# Patient Record
Sex: Female | Born: 1945 | Race: White | Hispanic: No | Smoking: Never smoker
Health system: Southern US, Community
[De-identification: ages and names within clinical notes are randomized; demographics above are authoritative.]

## PROBLEM LIST (undated history)

## (undated) DIAGNOSIS — E785 Hyperlipidemia, unspecified: Secondary | ICD-10-CM

## (undated) DIAGNOSIS — I350 Nonrheumatic aortic (valve) stenosis: Secondary | ICD-10-CM

## (undated) DIAGNOSIS — I2510 Atherosclerotic heart disease of native coronary artery without angina pectoris: Secondary | ICD-10-CM

## (undated) DIAGNOSIS — I10 Essential (primary) hypertension: Secondary | ICD-10-CM

## (undated) DIAGNOSIS — Z87442 Personal history of urinary calculi: Secondary | ICD-10-CM

## (undated) DIAGNOSIS — M1990 Unspecified osteoarthritis, unspecified site: Secondary | ICD-10-CM

## (undated) DIAGNOSIS — Z9289 Personal history of other medical treatment: Secondary | ICD-10-CM

## (undated) DIAGNOSIS — R011 Cardiac murmur, unspecified: Secondary | ICD-10-CM

## (undated) DIAGNOSIS — I358 Other nonrheumatic aortic valve disorders: Secondary | ICD-10-CM

## (undated) HISTORY — PX: CARDIAC CATHETERIZATION: SHX172

## (undated) HISTORY — PX: INNER EAR SURGERY: SHX679

## (undated) HISTORY — DX: Hyperlipidemia, unspecified: E78.5

## (undated) HISTORY — PX: EYE SURGERY: SHX253

## (undated) HISTORY — PX: LITHOTRIPSY: SUR834

## (undated) HISTORY — DX: Atherosclerotic heart disease of native coronary artery without angina pectoris: I25.10

## (undated) HISTORY — DX: Personal history of other medical treatment: Z92.89

## (undated) HISTORY — DX: Essential (primary) hypertension: I10

## (undated) HISTORY — PX: APPENDECTOMY: SHX54

## (undated) HISTORY — PX: ABDOMINAL HYSTERECTOMY: SHX81

## (undated) HISTORY — PX: BILATERAL CARPAL TUNNEL RELEASE: SHX6508

## (undated) HISTORY — DX: Cardiac murmur, unspecified: R01.1

## (undated) HISTORY — DX: Other nonrheumatic aortic valve disorders: I35.8

## (undated) HISTORY — PX: CHOLECYSTECTOMY: SHX55

---

## 1998-04-17 ENCOUNTER — Ambulatory Visit (HOSPITAL_COMMUNITY): Admission: RE | Admit: 1998-04-17 | Discharge: 1998-04-17 | Payer: Self-pay | Admitting: *Deleted

## 1998-10-23 ENCOUNTER — Ambulatory Visit (HOSPITAL_COMMUNITY): Admission: RE | Admit: 1998-10-23 | Discharge: 1998-10-23 | Payer: Self-pay | Admitting: Cardiology

## 1999-05-11 ENCOUNTER — Encounter: Payer: Self-pay | Admitting: Obstetrics and Gynecology

## 1999-05-11 ENCOUNTER — Encounter: Admission: RE | Admit: 1999-05-11 | Discharge: 1999-05-11 | Payer: Self-pay | Admitting: Obstetrics and Gynecology

## 1999-05-18 ENCOUNTER — Other Ambulatory Visit: Admission: RE | Admit: 1999-05-18 | Discharge: 1999-05-18 | Payer: Self-pay | Admitting: Obstetrics and Gynecology

## 2000-05-29 ENCOUNTER — Encounter: Payer: Self-pay | Admitting: Obstetrics and Gynecology

## 2000-05-29 ENCOUNTER — Encounter: Admission: RE | Admit: 2000-05-29 | Discharge: 2000-05-29 | Payer: Self-pay | Admitting: Obstetrics and Gynecology

## 2000-05-29 ENCOUNTER — Other Ambulatory Visit: Admission: RE | Admit: 2000-05-29 | Discharge: 2000-05-29 | Payer: Self-pay | Admitting: Obstetrics and Gynecology

## 2001-02-14 ENCOUNTER — Ambulatory Visit (HOSPITAL_COMMUNITY): Admission: RE | Admit: 2001-02-14 | Discharge: 2001-02-14 | Payer: Self-pay | Admitting: Gastroenterology

## 2001-02-14 ENCOUNTER — Encounter (INDEPENDENT_AMBULATORY_CARE_PROVIDER_SITE_OTHER): Payer: Self-pay | Admitting: Specialist

## 2001-05-30 ENCOUNTER — Encounter: Payer: Self-pay | Admitting: Gynecology

## 2001-05-30 ENCOUNTER — Other Ambulatory Visit: Admission: RE | Admit: 2001-05-30 | Discharge: 2001-05-30 | Payer: Self-pay | Admitting: Gynecology

## 2001-05-30 ENCOUNTER — Encounter: Admission: RE | Admit: 2001-05-30 | Discharge: 2001-05-30 | Payer: Self-pay | Admitting: Gynecology

## 2001-07-06 ENCOUNTER — Encounter: Admission: RE | Admit: 2001-07-06 | Discharge: 2001-07-06 | Payer: Self-pay | Admitting: Gynecology

## 2001-07-06 ENCOUNTER — Encounter: Payer: Self-pay | Admitting: Gynecology

## 2002-06-03 ENCOUNTER — Other Ambulatory Visit: Admission: RE | Admit: 2002-06-03 | Discharge: 2002-06-03 | Payer: Self-pay | Admitting: Gynecology

## 2002-06-04 ENCOUNTER — Encounter: Payer: Self-pay | Admitting: Gynecology

## 2002-06-04 ENCOUNTER — Encounter: Admission: RE | Admit: 2002-06-04 | Discharge: 2002-06-04 | Payer: Self-pay | Admitting: Gynecology

## 2003-06-10 ENCOUNTER — Other Ambulatory Visit: Admission: RE | Admit: 2003-06-10 | Discharge: 2003-06-10 | Payer: Self-pay | Admitting: Gynecology

## 2004-06-14 ENCOUNTER — Other Ambulatory Visit: Admission: RE | Admit: 2004-06-14 | Discharge: 2004-06-14 | Payer: Self-pay | Admitting: Gynecology

## 2005-07-14 ENCOUNTER — Other Ambulatory Visit: Admission: RE | Admit: 2005-07-14 | Discharge: 2005-07-14 | Payer: Self-pay | Admitting: Gynecology

## 2006-07-17 ENCOUNTER — Other Ambulatory Visit: Admission: RE | Admit: 2006-07-17 | Discharge: 2006-07-17 | Payer: Self-pay | Admitting: Gynecology

## 2009-08-17 ENCOUNTER — Encounter: Admission: RE | Admit: 2009-08-17 | Discharge: 2009-08-17 | Payer: Self-pay | Admitting: Gynecology

## 2010-07-12 DIAGNOSIS — Z9289 Personal history of other medical treatment: Secondary | ICD-10-CM

## 2010-07-12 HISTORY — DX: Personal history of other medical treatment: Z92.89

## 2010-07-12 HISTORY — PX: TRANSTHORACIC ECHOCARDIOGRAM: SHX275

## 2010-07-21 ENCOUNTER — Other Ambulatory Visit: Payer: Self-pay | Admitting: Gynecology

## 2010-07-21 DIAGNOSIS — Z1231 Encounter for screening mammogram for malignant neoplasm of breast: Secondary | ICD-10-CM

## 2010-08-17 ENCOUNTER — Other Ambulatory Visit: Payer: Self-pay | Admitting: Gynecology

## 2010-08-19 ENCOUNTER — Ambulatory Visit
Admission: RE | Admit: 2010-08-19 | Discharge: 2010-08-19 | Disposition: A | Discharge status: Home / Self Care | Payer: 59 | Source: Ambulatory Visit | Attending: Gynecology | Admitting: Gynecology

## 2010-08-19 DIAGNOSIS — Z1231 Encounter for screening mammogram for malignant neoplasm of breast: Secondary | ICD-10-CM

## 2010-08-20 NOTE — Procedures (Signed)
[facility]  Patient:    Sierra Carpenter, Sierra Carpenter Visit Number: [id number] MRN: [id number]          Service Type: END Location: ENDO Attending Physician:  Orland Mustard Dictated by:   Llana Aliment. Randa Evens, M.D. Proc. Date: 02/14/01 Admit Date:  02/14/2001   CC:         Lesle Chris, M.D., [facility]   Procedure Report  PROCEDURE PERFORMED:  Colonoscopy and coagulation of polyps.  MEDICATIONS:  Fentanyl 87.5 mcg, Versed 8 mg IV  SCOPE: Olympus pediatric video colonoscope  INDICATIONS:  Strong family history of colon cancer and polyps.  This is done for screening purposes.  DESCRIPTION OF PROCEDURE:  The procedure was explained to the patient and consent obtained.  With the patient in the left lateral decubitus position, the Olympus pediatric video colonoscope was inserted and advanced under direct visualization.  The prep was excellent and we were able to advance to the cecum.  The ileocecal valve was seen.  Appendiceal orifice identified.  The scope was withdrawn. In the mid ascending colon was a 3 mm sessile polyp that was cauterized.  The remainder of the ascending colon, hepatic flexure, transverse colon, splenic flexure, descending and sigmoid colon were seen well. 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 No other polyp seen throughout the colon.  Rectum was free of polyps.  The scope was withdrawn. The patient tolerated the procedure well and was maintained on low flow oxygen and pulse oximetry throughout the procedure with no obvious problems.  ASSESSMENT:  Ascending colon polyp removed.  PLAN:  Will give routine post polypectomy instructions and will recommend repeating this procedure depending on the results of the pathology in either three years or five years. Dictated by:   Llana Aliment. Randa Evens, M.D. Attending Physician:  Orland Mustard DD:  02/14/01 TD:  02/14/01 Job: [id number]

## 2010-10-26 ENCOUNTER — Other Ambulatory Visit: Payer: Self-pay | Admitting: Surgery

## 2011-04-28 ENCOUNTER — Ambulatory Visit (INDEPENDENT_AMBULATORY_CARE_PROVIDER_SITE_OTHER): Payer: 59

## 2011-04-28 DIAGNOSIS — J029 Acute pharyngitis, unspecified: Secondary | ICD-10-CM

## 2011-05-09 ENCOUNTER — Ambulatory Visit (INDEPENDENT_AMBULATORY_CARE_PROVIDER_SITE_OTHER): Payer: 59 | Admitting: Family Medicine

## 2011-05-09 ENCOUNTER — Telehealth: Payer: Self-pay

## 2011-05-09 VITALS — BP 146/82 | HR 72 | Temp 98.0°F | Resp 20 | Ht 61.5 in | Wt 164.0 lb

## 2011-05-09 DIAGNOSIS — E7800 Pure hypercholesterolemia, unspecified: Secondary | ICD-10-CM | POA: Insufficient documentation

## 2011-05-09 DIAGNOSIS — J209 Acute bronchitis, unspecified: Secondary | ICD-10-CM

## 2011-05-09 DIAGNOSIS — Z0000 Encounter for general adult medical examination without abnormal findings: Secondary | ICD-10-CM

## 2011-05-09 DIAGNOSIS — I10 Essential (primary) hypertension: Secondary | ICD-10-CM

## 2011-05-09 DIAGNOSIS — K635 Polyp of colon: Secondary | ICD-10-CM

## 2011-05-09 DIAGNOSIS — E785 Hyperlipidemia, unspecified: Secondary | ICD-10-CM

## 2011-05-09 MED ORDER — HYDROCODONE-HOMATROPINE 5-1.5 MG/5ML PO SYRP: 5.0000 mL | ORAL_SOLUTION | Freq: Four times a day (QID) | ORAL | Status: AC | PRN

## 2011-05-09 MED ORDER — AZITHROMYCIN 250 MG PO TABS: ORAL_TABLET | ORAL | Status: AC

## 2011-05-09 NOTE — Progress Notes (Signed)
This is a 66 year old woman who is retired from [employer]. She is currently married. She presents today with a two-week history of respiratory symptoms beginning with a sore throat. Shortly after the sore throat she came in for a evaluation and was given Magic mouthwash. Subsequently was referred to [place]. She became congested in her nose and now she's had one week of severe cough. Unproductive and there's been no fever she is also is stating that she has no shortness of breath. Patient does not smoke or have other ongoing respiratory problems. She has no chest pain.  Objective: No acute distress, cooperative and appropriate.  I spent 20 minutes reviewing health care maintenance issues the patient and loading them into the computer in a face-to-face interview.  HEENT: Normal  Chest: Few rhonchi otherwise clear  Heart: Normal rhythm rate without murmur.  Assessment: Acute process. Infection with bronchitis, worsening  Plan: Cough medicine and Z-Pak.

## 2011-05-09 NOTE — Telephone Encounter (Signed)
.  [facility] PT STATES THE PHARMACY WOULDN'T REFILL HER COUGH MEDICINE UNLESS IT IS CALLED IN BY THE DR PLEASE CALL PT AT [phone] AND THE PHARMACY IS [facility] ON [address]

## 2011-05-09 NOTE — Telephone Encounter (Signed)
Per 2/4 OV, Hydromet was rx'd.  Pharmacy closed for evening, please call [facility] in am to get details.

## 2011-05-09 NOTE — Patient Instructions (Signed)

## 2011-05-10 NOTE — Telephone Encounter (Signed)
Called hydromet into pharmacy - can not be sent in electronically. Notified pt that Rx called in.

## 2011-07-12 ENCOUNTER — Other Ambulatory Visit: Payer: Self-pay | Admitting: Gynecology

## 2011-07-12 DIAGNOSIS — Z1231 Encounter for screening mammogram for malignant neoplasm of breast: Secondary | ICD-10-CM

## 2011-07-22 ENCOUNTER — Ambulatory Visit (INDEPENDENT_AMBULATORY_CARE_PROVIDER_SITE_OTHER): Payer: 59 | Admitting: Family Medicine

## 2011-07-22 ENCOUNTER — Ambulatory Visit: Payer: 59

## 2011-07-22 VITALS — BP 110/72 | HR 74 | Temp 98.0°F | Resp 18 | Ht 61.5 in | Wt 164.0 lb

## 2011-07-22 DIAGNOSIS — M79609 Pain in unspecified limb: Secondary | ICD-10-CM

## 2011-07-22 DIAGNOSIS — M7730 Calcaneal spur, unspecified foot: Secondary | ICD-10-CM

## 2011-07-22 DIAGNOSIS — M25579 Pain in unspecified ankle and joints of unspecified foot: Secondary | ICD-10-CM

## 2011-07-22 DIAGNOSIS — M109 Gout, unspecified: Secondary | ICD-10-CM

## 2011-07-22 MED ORDER — PREDNISONE 20 MG PO TABS: ORAL_TABLET | ORAL | Status: AC

## 2011-07-22 NOTE — Progress Notes (Signed)
Subjective: Patient is here with pain in her left ankle. Started several days ago. It hurts when she moves her foot down when all. The initial pain awakened her early one morning. Knows of no injury and had not been doing any activities that might have triggered pain that she can think of.  Objective: Left ankle has tenderness along the joint line, a little more anterior to the lateral malleolus but has pain medially also. No warmth swelling or redness was noted.  Assessment: Ankle pain, rule out bony or inflammatory type problems.  Plan: Uricacid, cmet, xray  [facility] reading (PRIMARY) by  Dr. 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 If not better by Monday take prednisone.

## 2011-07-22 NOTE — Patient Instructions (Signed)
Gout Gout is an inflammatory condition (arthritis) caused by a buildup of uric acid crystals in the joints. Uric acid is a chemical that is normally present in the blood. Under some circumstances, uric acid can form into crystals in your joints. This causes joint redness, soreness, and swelling (inflammation). Repeat attacks are common. Over time, uric acid crystals can form into masses (tophi) near a joint, causing disfigurement. Gout is treatable and often preventable. CAUSES  The disease begins with elevated levels of uric acid in the blood. Uric acid is produced by your body when it breaks down a naturally found substance called purines. This also happens when you eat certain foods such as meats and fish. Causes of an elevated uric acid level include:  Being passed down from parent to child (heredity).   Diseases that cause increased uric acid production (obesity, psoriasis, some cancers).   Excessive alcohol use.   Diet, especially diets rich in meat and seafood.   Medicines, including certain cancer-fighting drugs (chemotherapy), diuretics, and aspirin.   Chronic kidney disease. The kidneys are no longer able to remove uric acid well.   Problems with metabolism.  Conditions strongly associated with gout include:  Obesity.   High blood pressure.   High cholesterol.   Diabetes.  Not everyone with elevated uric acid levels gets gout. It is not understood why some people get gout and others do not. Surgery, joint injury, and eating too much of certain foods are some of the factors that can lead to gout. SYMPTOMS   An attack of gout comes on quickly. It causes intense pain with redness, swelling, and warmth in a joint.   Fever can occur.   Often, only one joint is involved. Certain joints are more commonly involved:   Base of the big toe.   Knee.   Ankle.   Wrist.   Finger.  Without treatment, an attack usually goes away in a few days to weeks. Between attacks, you  usually will not have symptoms, which is different from many other forms of arthritis. DIAGNOSIS  Your caregiver will suspect gout based on your symptoms and exam. Removal of fluid from the joint (arthrocentesis) is done to check for uric acid crystals. Your caregiver will give you a medicine that numbs the area (local anesthetic) and use a needle to remove joint fluid for exam. Gout is confirmed when uric acid crystals are seen in joint fluid, using a special microscope. Sometimes, blood, urine, and X-ray tests are also used. TREATMENT  There are 2 phases to gout treatment: treating the sudden onset (acute) attack and preventing attacks (prophylaxis). Treatment of an Acute Attack  Medicines are used. These include anti-inflammatory medicines or steroid medicines.   An injection of steroid medicine into the affected joint is sometimes necessary.   The painful joint is rested. Movement can worsen the arthritis.   You may use warm or cold treatments on painful joints, depending which works best for you.   Discuss the use of coffee, vitamin C, or cherries with your caregiver. These may be helpful treatment options.  Treatment to Prevent Attacks After the acute attack subsides, your caregiver may advise prophylactic medicine. These medicines either help your kidneys eliminate uric acid from your body or decrease your uric acid production. You may need to stay on these medicines for a very long time. The early phase of treatment with prophylactic medicine can be associated with an increase in acute gout attacks. For this reason, during the first few months   of treatment, your caregiver may also advise you to take medicines usually used for acute gout treatment. Be sure you understand your caregiver's directions. You should also discuss dietary treatment with your caregiver. Certain foods such as meats and fish can increase uric acid levels. Other foods such as dairy can decrease levels. Your caregiver  can give you a list of foods to avoid. HOME CARE INSTRUCTIONS   Do not take aspirin to relieve pain. This raises uric acid levels.   Only take over-the-counter or prescription medicines for pain, discomfort, or fever as directed by your caregiver.   Rest the joint as much as possible. When in bed, keep sheets and blankets off painful areas.   Keep the affected joint raised (elevated).   Use crutches if the painful joint is in your leg.   Drink enough water and fluids to keep your urine clear or pale yellow. This helps your body get rid of uric acid. Do not drink alcoholic beverages. They slow the passage of uric acid.   Follow your caregiver's dietary instructions. Pay careful attention to the amount of protein you eat. Your daily diet should emphasize fruits, vegetables, whole grains, and fat-free or low-fat milk products.   Maintain a healthy body weight.  SEEK MEDICAL CARE IF:   You have an oral temperature above 102 F (38.9 C).   You develop diarrhea, vomiting, or any side effects from medicines.   You do not feel better in 24 hours, or you are getting worse.  SEEK IMMEDIATE MEDICAL CARE IF:   Your joint becomes suddenly more tender and you have:   Chills.   An oral temperature above 102 F (38.9 C), not controlled by medicine.  MAKE SURE YOU:   Understand these instructions.   Will watch your condition.   Will get help right away if you are not doing well or get worse.  Document Released: 03/18/2000 Document Revised: 03/10/2011 Document Reviewed: 06/29/2009 Exit[employer] Patient Information 2012 [employer]. 

## 2011-07-23 ENCOUNTER — Encounter: Payer: Self-pay | Admitting: Family Medicine

## 2011-07-23 LAB — COMPLETE METABOLIC PANEL WITH GFR
Alkaline Phosphatase: 70 U/L (ref 39–117)
BUN: 21 mg/dL (ref 6–23)
Creat: 1.13 mg/dL — ABNORMAL HIGH (ref 0.50–1.10)
GFR, Est Non African American: 52 mL/min — ABNORMAL LOW
Glucose, Bld: 81 mg/dL (ref 70–99)
Total Bilirubin: 0.3 mg/dL (ref 0.3–1.2)

## 2011-07-23 LAB — URIC ACID: Uric Acid, Serum: 6.5 mg/dL (ref 2.4–7.0)

## 2011-08-18 ENCOUNTER — Other Ambulatory Visit: Payer: Self-pay | Admitting: Gynecology

## 2011-08-22 ENCOUNTER — Ambulatory Visit
Admission: RE | Admit: 2011-08-22 | Discharge: 2011-08-22 | Disposition: A | Discharge status: Home / Self Care | Payer: 59 | Source: Ambulatory Visit | Attending: Gynecology | Admitting: Gynecology

## 2011-08-22 DIAGNOSIS — Z1231 Encounter for screening mammogram for malignant neoplasm of breast: Secondary | ICD-10-CM

## 2011-11-03 ENCOUNTER — Ambulatory Visit: Payer: 59

## 2011-11-03 ENCOUNTER — Ambulatory Visit (INDEPENDENT_AMBULATORY_CARE_PROVIDER_SITE_OTHER): Payer: 59 | Admitting: Family Medicine

## 2011-11-03 VITALS — BP 134/66 | HR 80 | Temp 97.9°F | Resp 16 | Ht 61.5 in | Wt 145.0 lb

## 2011-11-03 DIAGNOSIS — M79673 Pain in unspecified foot: Secondary | ICD-10-CM

## 2011-11-03 DIAGNOSIS — M79606 Pain in leg, unspecified: Secondary | ICD-10-CM

## 2011-11-03 DIAGNOSIS — M25579 Pain in unspecified ankle and joints of unspecified foot: Secondary | ICD-10-CM

## 2011-11-03 DIAGNOSIS — M25559 Pain in unspecified hip: Secondary | ICD-10-CM

## 2011-11-03 DIAGNOSIS — M79609 Pain in unspecified limb: Secondary | ICD-10-CM

## 2011-11-03 MED ORDER — IBUPROFEN 600 MG PO TABS: 600.0000 mg | ORAL_TABLET | Freq: Three times a day (TID) | ORAL | Status: AC | PRN

## 2011-11-03 MED ORDER — TRAMADOL HCL 50 MG PO TABS: 50.0000 mg | ORAL_TABLET | Freq: Three times a day (TID) | ORAL | Status: AC | PRN
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
## 2011-11-05 ENCOUNTER — Ambulatory Visit (INDEPENDENT_AMBULATORY_CARE_PROVIDER_SITE_OTHER): Payer: 59 | Admitting: Emergency Medicine

## 2011-11-05 VITALS — BP 105/65 | HR 81 | Temp 98.2°F | Resp 16 | Ht 61.5 in | Wt 150.0 lb

## 2011-11-05 DIAGNOSIS — S81819A Laceration without foreign body, unspecified lower leg, initial encounter: Secondary | ICD-10-CM

## 2011-11-05 DIAGNOSIS — S81009A Unspecified open wound, unspecified knee, initial encounter: Secondary | ICD-10-CM

## 2011-11-05 DIAGNOSIS — S91009A Unspecified open wound, unspecified ankle, initial encounter: Secondary | ICD-10-CM
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
## 2012-06-01 ENCOUNTER — Ambulatory Visit (INDEPENDENT_AMBULATORY_CARE_PROVIDER_SITE_OTHER): Payer: 59 | Admitting: Physician Assistant

## 2012-06-01 VITALS — BP 119/78 | HR 109 | Temp 98.2°F | Resp 16 | Ht 62.0 in | Wt 157.0 lb

## 2012-06-01 DIAGNOSIS — J329 Chronic sinusitis, unspecified: Secondary | ICD-10-CM

## 2012-06-01 MED ORDER — AMOXICILLIN-POT CLAVULANATE 875-125 MG PO TABS: 1.0000 | ORAL_TABLET | Freq: Two times a day (BID) | ORAL | Status: DC

## 2012-06-01 MED ORDER — FLUTICASONE PROPIONATE 50 MCG/ACT NA SUSP: 2.0000 | Freq: Every day | NASAL | Status: DC

## 2012-06-01 NOTE — Progress Notes (Signed)
  Subjective:    Patient ID: Sierra Carpenter, female    DOB: Oct 11, 1945, 67 y.o.   MRN: [id number]  HPI 67 year old female presents with 2 week history of progressively worsening sinus pressure/pain, nasal congestion, and left ear pain.  States symptoms started as a "cold" with thin nasal congestion and rhinorrhea, but have progressively worsened to left facial pressure and fullness with thick, yellow/green nasal discharge.  Also complains of left ear pain with popping and pressure.  Admits to low grade fever of 100.0 2 days ago, but has not documented a fever since then.  Has taken ibuprofen which has helped her headache.  Denies cough, SOB, sore throat, nausea, vomiting, or dizziness.   PMHx of HTN and hyperlipidemia  Caring for mother with newly diagnosed early dementia and her husband who has emphysema - has created a lot of stress.     Review of Systems  Constitutional: Positive for chills. Negative for fever.  HENT: Positive for ear pain (left sided), congestion, rhinorrhea, postnasal drip and sinus pressure. Negative for sore throat.   Respiratory: Negative for cough, shortness of breath and wheezing.   Cardiovascular: Negative for chest pain.  Gastrointestinal: Negative for nausea and vomiting.  Neurological: Positive for headaches. Negative for dizziness and light-headedness.       Objective:   Physical Exam  Constitutional: She is oriented to person, place, and time. She appears well-developed and well-nourished.  HENT:  Head: Normocephalic and atraumatic.  Right Ear: Hearing, tympanic membrane, external ear and ear canal normal.  Left Ear: Hearing, tympanic membrane, external ear and ear canal normal.  Nose: Right sinus exhibits no maxillary sinus tenderness and no frontal sinus tenderness. Left sinus exhibits maxillary sinus tenderness. Left sinus exhibits no frontal sinus tenderness.  Mouth/Throat: Uvula is midline, oropharynx is clear and moist and mucous membranes are  normal. No oropharyngeal exudate.  Eyes: Conjunctivae are normal.  Neck: Normal range of motion. Neck supple.  Cardiovascular: Normal rate, regular rhythm and normal heart sounds.   Pulmonary/Chest: Effort normal and breath sounds normal.  Lymphadenopathy:    She has no cervical adenopathy.  Neurological: She is alert and oriented to person, place, and time.  Psychiatric: She has a normal mood and affect. Her behavior is normal. Judgment and thought content normal.          Assessment & Plan:  1. Sinus infection - Plan: amoxicillin-clavulanate (AUGMENTIN) 875-125 MG per tablet, fluticasone (FLONASE) 50 MCG/ACT nasal spray  -Augmentin 875 mg bid x 10 days  -Flonase bid to help with pressure and congestion  -Mucinex as directed  -Increase fluids and rest  -Follow up if symptoms worsen or fail to improve.

## 2012-07-31 ENCOUNTER — Other Ambulatory Visit: Payer: Self-pay

## 2012-07-31 DIAGNOSIS — Z1231 Encounter for screening mammogram for malignant neoplasm of breast: Secondary | ICD-10-CM

## 2012-08-23 ENCOUNTER — Ambulatory Visit
Admission: RE | Admit: 2012-08-23 | Discharge: 2012-08-23 | Disposition: A | Discharge status: Home / Self Care | Payer: 59 | Source: Ambulatory Visit

## 2012-08-23 DIAGNOSIS — Z1231 Encounter for screening mammogram for malignant neoplasm of breast: Secondary | ICD-10-CM

## 2012-10-01 ENCOUNTER — Ambulatory Visit (INDEPENDENT_AMBULATORY_CARE_PROVIDER_SITE_OTHER): Payer: 59 | Admitting: Family Medicine

## 2012-10-01 VITALS — BP 134/81 | HR 78 | Temp 98.2°F | Resp 18 | Ht 61.5 in | Wt 163.6 lb

## 2012-10-01 DIAGNOSIS — J019 Acute sinusitis, unspecified: Secondary | ICD-10-CM

## 2012-10-01 DIAGNOSIS — J329 Chronic sinusitis, unspecified: Secondary | ICD-10-CM

## 2012-10-01 MED ORDER — AMOXICILLIN-POT CLAVULANATE 875-125 MG PO TABS: 1.0000 | ORAL_TABLET | Freq: Two times a day (BID) | ORAL | Status: DC

## 2012-10-01 NOTE — Patient Instructions (Addendum)

## 2012-10-01 NOTE — Progress Notes (Signed)
Patient ID: Donovan J Hutsell MRN: [id number], DOB: 1945-08-13, 67 y.o. Date of Encounter: 10/01/2012, 8:20 AM  Primary Physician: No PCP Per Patient  Chief Complaint:  Chief Complaint  Patient presents with  . Headache  . Cough    x yesterday    HPI: 67 y.o. year old female presents with 2 day history of nasal congestion, post nasal drip, sore throat, sinus pressure, and cough. Afebrile. No chills. Nasal congestion thick and green/yellow. Sinus pressure is the worst symptom. Cough is productive secondary to post nasal drip and not associated with time of day. Ears feel full, leading to sensation of muffled hearing. Has tried OTC cold preps without success. No GI complaints.   No recent antibiotics, recent travels, or sick contacts   No leg trauma, sedentary periods, h/o cancer, or tobacco use.  Past Medical History  Diagnosis Date  . Hyperlipidemia   . Hypertension      Home Meds: Prior to Admission medications   Medication Sig Start Date End Date Taking? Authorizing Provider  aspirin 81 MG tablet Take 160 mg by mouth daily.   Yes Historical Provider, MD  atorvastatin (LIPITOR) 40 MG tablet Take 40 mg by mouth daily.   Yes Historical Provider, MD  fish oil-omega-3 fatty acids 1000 MG capsule Take 1 capsule by mouth 3 (three) times daily.   Yes Historical Provider, MD  losartan-hydrochlorothiazide (HYZAAR) 50-12.5 MG per tablet Take 1 tablet by mouth daily.   Yes Historical Provider, MD  amoxicillin-clavulanate (AUGMENTIN) 875-125 MG per tablet Take 1 tablet by mouth 2 (two) times daily. 06/01/12   Nelva Nay, PA-C  calcium & magnesium carbonates (MYLANTA) 295-621 MG per tablet Take 600 tablets by mouth daily.    Historical Provider, MD  co-enzyme Q-10 50 MG capsule Take 100 mg by mouth 2 (two) times daily.    Historical Provider, MD  dextromethorphan 15 MG/5ML syrup Take 10 mLs by mouth 4 (four) times daily as needed.    Historical Provider, MD  fluticasone (FLONASE) 50  MCG/ACT nasal spray Place 2 sprays into the nose daily. 06/01/12   Nelva Nay, PA-C  VITAMIN D, CHOLECALCIFEROL, PO Take 1 tablet by mouth daily.    Historical Provider, MD    Allergies: No Known Allergies  History   Social History  . Marital Status: Married    Spouse Name: N/A    Number of Children: N/A  . Years of Education: N/A   Occupational History  . Not on file.   Social History Main Topics  . Smoking status: Never Smoker   . Smokeless tobacco: Not on file  . Alcohol Use: No  . Drug Use: No  . Sexually Active: Yes   Other Topics Concern  . Not on file   Social History Narrative  . No narrative on file     Review of Systems: Constitutional: negative for chills, fever, night sweats or weight changes Cardiovascular: negative for chest pain or palpitations Respiratory: negative for hemoptysis, wheezing, or shortness of breath Abdominal: negative for abdominal pain, nausea, vomiting or diarrhea Dermatological: negative for rash Neurologic: negative for headache   Physical Exam: Blood pressure 134/81, pulse 78, temperature 98.2 F (36.8 C), temperature source Oral, resp. rate 18, height 5' 1.5" (1.562 m), weight 163 lb 9.6 oz (74.208 kg), SpO2 97.00%., Body mass index is 30.42 kg/(m^2). General: Well developed, well nourished, in no acute distress. Head: Normocephalic, atraumatic, eyes without discharge, sclera non-icteric, nares are congested. Bilateral auditory canals clear, TM's  are without perforation, pearly grey with reflective cone of light bilaterally. Serous effusion bilaterally behind TM's. Maxillary sinus TTP. Oral cavity moist, dentition normal. Posterior pharynx with post nasal drip and mild erythema. No peritonsillar abscess or tonsillar exudate. Neck: Supple. No thyromegaly. Full ROM. No lymphadenopathy. Lungs: Clear bilaterally to auscultation without wheezes, rales, or rhonchi. Breathing is unlabored.  Heart: RRR with S1 S2. No murmurs, rubs, or  gallops appreciated. Msk:  Strength and tone normal for age. Extremities: No clubbing or cyanosis. No edema. Neuro: Alert and oriented X 3. Moves all extremities spontaneously. CNII-XII grossly in tact. Psych:  Responds to questions appropriately with a normal affect.     ASSESSMENT AND PLAN:  67 y.o. year old female with sinusitis Sinus infection - Plan: amoxicillin-clavulanate (AUGMENTIN) 875-125 MG per tablet   -  -Tylenol/Motrin prn -Rest/fluids -RTC precautions -RTC 3-5 days if no improvement  Signed, Elvina Sidle, MD 10/01/2012 8:20 AM

## 2012-10-20 ENCOUNTER — Ambulatory Visit: Payer: 59

## 2012-10-20 ENCOUNTER — Ambulatory Visit (INDEPENDENT_AMBULATORY_CARE_PROVIDER_SITE_OTHER): Payer: 59 | Admitting: Emergency Medicine

## 2012-10-20 VITALS — BP 118/74 | HR 64 | Temp 98.1°F | Resp 16 | Ht 61.75 in | Wt 162.0 lb

## 2012-10-20 DIAGNOSIS — R059 Cough, unspecified: Secondary | ICD-10-CM

## 2012-10-20 DIAGNOSIS — J209 Acute bronchitis, unspecified: Secondary | ICD-10-CM

## 2012-10-20 DIAGNOSIS — R011 Cardiac murmur, unspecified: Secondary | ICD-10-CM

## 2012-10-20 DIAGNOSIS — R05 Cough: Secondary | ICD-10-CM

## 2012-10-20 LAB — POCT CBC
Granulocyte percent: 74.7 %G (ref 37–80)
MID (cbc): 0.7 (ref 0–0.9)
POC Granulocyte: 7.8 — AB (ref 2–6.9)
POC LYMPH PERCENT: 18.5 %L (ref 10–50)
Platelet Count, POC: 303 10*3/uL (ref 142–424)
RDW, POC: 13.3 %

## 2012-10-20 MED ORDER — HYDROCODONE-HOMATROPINE 5-1.5 MG/5ML PO SYRP: 5.0000 mL | ORAL_SOLUTION | Freq: Three times a day (TID) | ORAL | Status: DC | PRN

## 2012-10-20 MED ORDER — AZITHROMYCIN 250 MG PO TABS: ORAL_TABLET | ORAL | Status: DC

## 2012-10-20 NOTE — Patient Instructions (Addendum)

## 2012-10-20 NOTE — Progress Notes (Signed)
  Subjective:    Patient ID: Sierra Carpenter, female    DOB: 30-Dec-1945, 67 y.o.   MRN: [id number]  HPI patient recently seen by Dr. Milus Glazier  and treated for a sinus infection. Since then she has had a persistent cough. She states her cough is nonproductive. She has never been a smoker but her husband has been a heavy previous smoker    Review of Systems     Objective:   Physical Exam patient is alert and cooperative she is in no distress. Her neck is supple. Her chest is clear to auscultation and percussion. Cardiac reveals regular rate no murmurs.  [facility] reading (PRIMARY) by  Dr. Cleta Alberts No acute disease   Results for orders placed in visit on 10/20/12  POCT CBC      Result Value Range   WBC 10.4 (*) 4.6 - 10.2 K/uL   Lymph, poc 1.9  0.6 - 3.4   POC LYMPH PERCENT 18.5  10 - 50 %L   MID (cbc) 0.7  0 - 0.9   POC MID % 6.8  0 - 12 %M   POC Granulocyte 7.8 (*) 2 - 6.9   Granulocyte percent 74.7  37 - 80 %G   RBC 4.93  4.04 - 5.48 M/uL   Hemoglobin 16.0  12.2 - 16.2 g/dL   HCT, POC 40.9 (*) 81.1 - 47.9 %   MCV 98.9 (*) 80 - 97 fL   MCH, POC 32.5 (*) 27 - 31.2 pg   MCHC 32.8  31.8 - 35.4 g/dL   RDW, POC 91.4     Platelet Count, POC 303  142 - 424 K/uL   MPV 9.5  0 - 99.8 fL         Assessment & Plan:  White blood count is elevated slightly. her chest x-ray does not show any pneumonic infiltrates. We will treat with a Z-Pak. Hycodan for cough and see if this resolves her symptoms

## 2012-11-17 ENCOUNTER — Ambulatory Visit (INDEPENDENT_AMBULATORY_CARE_PROVIDER_SITE_OTHER): Payer: 59 | Admitting: Internal Medicine

## 2012-11-17 ENCOUNTER — Ambulatory Visit: Payer: 59

## 2012-11-17 VITALS — BP 122/68 | HR 79 | Temp 97.4°F | Resp 18 | Ht 62.0 in | Wt 162.8 lb

## 2012-11-17 DIAGNOSIS — S93409A Sprain of unspecified ligament of unspecified ankle, initial encounter: Secondary | ICD-10-CM

## 2012-11-17 DIAGNOSIS — M79609 Pain in unspecified limb: Secondary | ICD-10-CM

## 2012-11-17 NOTE — Progress Notes (Addendum)
  Subjective:    Patient ID: Sierra Carpenter, female    DOB: 1945-04-14, 67 y.o.   MRN: [id number]  HPI swollen ankle for 4 days after falling backyard Scraped her ankle as well on concrete slab that area is healing Able to walk but has persistent swelling with tenderness  Patient Active Problem List   Diagnosis Date Noted  . Hypertension 05/09/2011  . Hyperlipidemia 05/09/2011  . Colon polyps 05/09/2011     Review of Systems     Objective:   Physical Exam BP 122/68  Pulse 79  Temp(Src) 97.4 F (36.3 C) (Oral)  Resp 18  Ht 5\' 2"  (1.575 m)  Wt 162 lb 12.8 oz (73.846 kg)  BMI 29.77 kg/m2  SpO2 98% No acute distress Left ankle swollen over the anterior talofibular area with tenderness to palpation and pain on inversion Joint stable to stress and Achilles intact Mildly tender along distal fibula   [facility] reading (PRIMARY) by  Dr.Dandre Sisler=no fx       Assessment & Plan:Ankle sprain lateral malleolus left  Brace-[facility] exer///ice daily Check 3 weeks if not well   Maurene Capes,, CMA fit and trained for left Ankle Brace

## 2012-12-20 ENCOUNTER — Telehealth: Payer: Self-pay | Admitting: Cardiovascular Disease

## 2012-12-20 DIAGNOSIS — R5381 Other malaise: Secondary | ICD-10-CM

## 2012-12-20 DIAGNOSIS — E782 Mixed hyperlipidemia: Secondary | ICD-10-CM

## 2012-12-20 DIAGNOSIS — Z79899 Other long term (current) drug therapy: Secondary | ICD-10-CM

## 2012-12-20 NOTE — Telephone Encounter (Signed)
Needs a lab order mailed out to her before her appt on 01/01/13 .Marland Kitchen Thanks

## 2012-12-20 NOTE — Telephone Encounter (Signed)
Lab order mailed to patient.

## 2012-12-26 ENCOUNTER — Other Ambulatory Visit: Payer: Self-pay | Admitting: Cardiovascular Disease

## 2012-12-26 ENCOUNTER — Encounter: Payer: Self-pay | Admitting: *Deleted

## 2012-12-26 ENCOUNTER — Ambulatory Visit: Payer: 59 | Admitting: Cardiovascular Disease

## 2012-12-26 LAB — COMPREHENSIVE METABOLIC PANEL
AST: 16 U/L (ref 0–37)
Albumin: 4.4 g/dL (ref 3.5–5.2)
Alkaline Phosphatase: 75 U/L (ref 39–117)
BUN: 25 mg/dL — ABNORMAL HIGH (ref 6–23)
Potassium: 4.1 mEq/L (ref 3.5–5.3)
Sodium: 142 mEq/L (ref 135–145)

## 2012-12-26 LAB — LIPID PANEL
HDL: 43 mg/dL (ref >39)
LDL Cholesterol: 99 mg/dL (ref 0–99)
VLDL: 37 mg/dL (ref 0–40)

## 2013-01-01 ENCOUNTER — Ambulatory Visit (INDEPENDENT_AMBULATORY_CARE_PROVIDER_SITE_OTHER): Payer: 59 | Admitting: Cardiovascular Disease

## 2013-01-01 ENCOUNTER — Encounter: Payer: Self-pay | Admitting: Cardiovascular Disease

## 2013-01-01 VITALS — BP 110/62 | HR 75 | Resp 16 | Ht 62.0 in | Wt 165.5 lb

## 2013-01-01 DIAGNOSIS — E785 Hyperlipidemia, unspecified: Secondary | ICD-10-CM

## 2013-01-01 DIAGNOSIS — I2510 Atherosclerotic heart disease of native coronary artery without angina pectoris: Secondary | ICD-10-CM | POA: Insufficient documentation

## 2013-01-01 DIAGNOSIS — I10 Essential (primary) hypertension: Secondary | ICD-10-CM

## 2013-01-01 NOTE — Patient Instructions (Addendum)
Your physician recommends that you schedule a follow-up appointment in: 12 months.  

## 2013-01-01 NOTE — Assessment & Plan Note (Signed)
Minor nonobstructive lesions were seen in her first diagonal artery and proximal right coronary artery by cardiac catheterization in 2000. She is asymptomatic. She had a normal nuclear stress test in April 2012. Risk factor modification remains the mainstay of therapy

## 2013-01-01 NOTE — Assessment & Plan Note (Signed)
Excellent control.   

## 2013-01-01 NOTE — Assessment & Plan Note (Signed)
Lipid levels are still in the acceptable range with the exception of elevated triglycerides. All the parameters have deteriorated somewhat compared to last year, this is likely attributable to weight gain. I don't think it makes sense to change her pharmacological therapy, but we discussed the importance of diet, exercise and weight loss. She will likely not find much time to exercise considering the chronic illnesses of her family members but we talked about more healthy snacks and eating patterns.

## 2013-01-01 NOTE — Progress Notes (Signed)
Patient ID: Sierra Carpenter, female   DOB: 30-Dec-1945, 67 y.o.   MRN: [id number]     Reason for office visit Hyperlipidemia, hypertension, coronary atherosclerosis  Sierra Carpenter is doing well from a physical standpoint, but is under considerable mental and social stress. Her husband almost passed away from respiratory failure in [place] and now is under home hospice care. Her mother has progressive dementia. She also has to help with the care of her 2 brothers. She finds very little time to take care of herself.  She has gained back the 15 pounds or so that she had lost last year and this is reflected in some worsening of her lipid parameters. Her blood pressure control remains excellent and she does not have diabetes mellitus. She has no cardiovascular complaints.  Coronary angiography in the year 2000 showed a 50% ostial first diagonal and a 30% proximal right coronary artery lesions. She had a normal nuclear stress test roughly 2 years ago. 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    No Known Allergies  Current Outpatient Prescriptions  Medication Sig Dispense Refill  . aspirin 81 MG tablet Take 160 mg by mouth daily.      Marland Kitchen atorvastatin (LIPITOR) 40 MG tablet Take 40 mg by mouth daily.      Marland Kitchen co-enzyme Q-10 50 MG capsule Take 100 mg by mouth 2 (two) times daily.      . fish oil-omega-3 fatty acids 1000 MG capsule Take 1 capsule by mouth 3 (three) times daily.      Marland Kitchen losartan-hydrochlorothiazide (HYZAAR) 50-12.5 MG per tablet Take 1 tablet by mouth daily.      . Multiple Vitamin (MULTIVITAMIN) tablet Take 1 tablet by mouth daily.       No current facility-administered medications for this visit.    Past Medical History  Diagnosis Date  . Hyperlipidemia   . Hypertension   . Coronary atherosclerosis     without significant obstruction  . Aortic valve sclerosis   . History of nuclear stress test  07/12/2010    bruce protocol myoview; normal pattern of perfusion, low risk, post-stress EF 92%    Past Surgical History  Procedure Laterality Date  . Appendectomy    . Abdominal hysterectomy    . Cholecystectomy    . Cardiac catheterization      moderate stenosis in 1st diagonal & RCA  . Transthoracic echocardiogram  07/12/2010    EF=>55% with normal systolic function; trace MR/TR/PR    Family History  Problem Relation Age of Onset  . Heart disease Father   . Heart disease Brother     x3  . Heart disease Son   . Heart disease Paternal Grandfather   . Alzheimer's disease Mother     History   Social History  . Marital Status: Married    Spouse Name: N/A    Number of Children: 1  . Years of Education: 12   Occupational History  . Not on file.   Social History Main Topics  . Smoking status: Never Smoker   . Smokeless tobacco: Not on file  . Alcohol Use: No  . Drug Use: No  . Sexual Activity: Yes   Other Topics Concern  . Not on file   Social History Narrative  . No narrative on file    Review of systems: The patient specifically denies any chest pain at rest or with exertion, dyspnea at rest or  with exertion, orthopnea, paroxysmal nocturnal dyspnea, syncope, palpitations, focal neurological deficits, intermittent claudication, lower extremity edema, unexplained weight gain, cough, hemoptysis or wheezing.  The patient also denies abdominal pain, nausea, vomiting, dysphagia, diarrhea, constipation, polyuria, polydipsia, dysuria, hematuria, frequency, urgency, abnormal bleeding or bruising, fever, chills, unexpected weight changes, mood swings, change in skin or hair texture, change in voice quality, auditory or visual problems, allergic reactions or rashes, new musculoskeletal complaints other than usual "aches and pains".   PHYSICAL EXAM BP 110/62  Pulse 75  Resp 16  Ht 5\' 2"  (1.575 m)  Wt 165 lb 8 oz (75.07 kg)  BMI 30.26 kg/m2  General: Alert, oriented x3, no  distress Head: no evidence of trauma, PERRL, EOMI, no exophtalmos or lid lag, no myxedema, no xanthelasma; normal ears, nose and oropharynx Neck: normal jugular venous pulsations and no hepatojugular reflux; brisk carotid pulses without delay and no carotid bruits Chest: clear to auscultation, no signs of consolidation by percussion or palpation, normal fremitus, symmetrical and full respiratory excursions Cardiovascular: normal position and quality of the apical impulse, regular rhythm, normal first and second heart sounds, no  rubs or gallops, rate 3/6 early peaking systolic ejection murmur in the aortic focus Abdomen: no tenderness or distention, no masses by palpation, no abnormal pulsatility or arterial bruits, normal bowel sounds, no hepatosplenomegaly Extremities: no clubbing, cyanosis or edema; 2+ radial, ulnar and brachial pulses bilaterally; 2+ right femoral, posterior tibial and dorsalis pedis pulses; 2+ left femoral, posterior tibial and dorsalis pedis pulses; no subclavian or femoral bruits Neurological: grossly nonfocal   EKG: NSR  Lipid Panel     Component Value Date/Time   CHOL 179 12/26/2012 0729   TRIG 187* 12/26/2012 0729   HDL 43 12/26/2012 0729   CHOLHDL 4.2 12/26/2012 0729   VLDL 37 12/26/2012 0729   LDLCALC 99 12/26/2012 0729    BMET    Component Value Date/Time   NA 142 12/26/2012 0729   K 4.1 12/26/2012 0729   CL 106 12/26/2012 0729   CO2 29 12/26/2012 0729   GLUCOSE 89 12/26/2012 0729   BUN 25* 12/26/2012 0729   CREATININE 1.11* 12/26/2012 0729   CALCIUM 9.5 12/26/2012 0729     ASSESSMENT AND PLAN Hypertension Excellent control.  Hyperlipidemia Lipid levels are still in the acceptable range with the exception of elevated triglycerides. All the parameters have deteriorated somewhat compared to last year, this is likely attributable to weight gain. I don't think it makes sense to change her pharmacological therapy, but we discussed the importance of diet, exercise  and weight loss. She will likely not find much time to exercise considering the chronic illnesses of her family members but we talked about more healthy snacks and eating patterns.  Coronary atherosclerosis Minor nonobstructive lesions were seen in her first diagonal artery and proximal right coronary artery by cardiac catheterization in 2000. She is asymptomatic. She had a normal nuclear stress test in April 2012. Risk factor modification remains the mainstay of therapy  Orders Placed This Encounter  Procedures  . EKG 12-Lead   No orders of the defined types were placed in this encounter.    Junious Silk, MD, [facility] [phone] office [phone] pager

## 2013-02-05 ENCOUNTER — Ambulatory Visit (INDEPENDENT_AMBULATORY_CARE_PROVIDER_SITE_OTHER): Payer: 59 | Admitting: Family Medicine

## 2013-02-05 VITALS — BP 124/78 | HR 69 | Temp 97.9°F | Resp 16 | Ht 62.0 in | Wt 167.8 lb

## 2013-02-05 DIAGNOSIS — R05 Cough: Secondary | ICD-10-CM

## 2013-02-05 DIAGNOSIS — J069 Acute upper respiratory infection, unspecified: Secondary | ICD-10-CM

## 2013-02-05 DIAGNOSIS — R059 Cough, unspecified: Secondary | ICD-10-CM

## 2013-02-05 DIAGNOSIS — J310 Chronic rhinitis: Secondary | ICD-10-CM

## 2013-02-05 MED ORDER — AMOXICILLIN 875 MG PO TABS: 875.0000 mg | ORAL_TABLET | Freq: Two times a day (BID) | ORAL | Status: DC

## 2013-02-05 MED ORDER — BENZONATATE 100 MG PO CAPS: 100.0000 mg | ORAL_CAPSULE | Freq: Three times a day (TID) | ORAL | Status: DC | PRN

## 2013-02-05 NOTE — Patient Instructions (Signed)
Drink plenty of fluids  Use the antibiotic one twice daily  Take the cough tablets 1 gelcap 3 times daily if needed for cough  Return if worse

## 2013-02-05 NOTE — Progress Notes (Signed)
Subjective: 67 year old lady who's here because she has a respiratory tract infection for the past several days. She's been having a lot of purulent had some bloody phlegm from her nose. Ears are not bothering her. She is coughing some. Her throat is not bothering her particularly. Her husband has end-stage emphysema, is on hospice for that, and she is cautious to try to avoid getting infections that might involve him. She also brought her mother to the physician today.  Objective: Pleasant lady in no major distress. Does have some cough. Her TMs are normal. There is a little congested. Throat has postnasal drainage visible. Neck supple without significant nodes. Chest is clear to auscultation. Heart is regular, systolic murmur across precordium. That is a known entity.  Assessment: URI and cough Purulent rhinitis  Plan: This may still be viral, but could have some bacterial component going on with the bloody purulent nose. We'll go ahead and treat her with antibiotics especially because of the home situation.

## 2013-04-04 ENCOUNTER — Other Ambulatory Visit: Payer: Self-pay | Admitting: Cardiovascular Disease

## 2013-04-05 ENCOUNTER — Other Ambulatory Visit: Payer: Self-pay | Admitting: *Deleted

## 2013-04-05 MED ORDER — ATORVASTATIN CALCIUM 40 MG PO TABS: 40.0000 mg | ORAL_TABLET | Freq: Every day | ORAL | Status: DC

## 2013-04-05 NOTE — Telephone Encounter (Signed)
Rx was sent to pharmacy electronically. 

## 2013-09-06 DIAGNOSIS — Z0000 Encounter for general adult medical examination without abnormal findings: Secondary | ICD-10-CM | POA: Diagnosis not present

## 2013-09-06 DIAGNOSIS — Z01419 Encounter for gynecological examination (general) (routine) without abnormal findings: Secondary | ICD-10-CM | POA: Diagnosis not present

## 2013-09-06 DIAGNOSIS — Z1231 Encounter for screening mammogram for malignant neoplasm of breast: Secondary | ICD-10-CM | POA: Diagnosis not present

## 2013-09-18 ENCOUNTER — Ambulatory Visit (INDEPENDENT_AMBULATORY_CARE_PROVIDER_SITE_OTHER): Payer: Medicare Other | Admitting: Family Medicine

## 2013-09-18 VITALS — BP 132/82 | HR 79 | Temp 98.4°F | Resp 16 | Ht 61.5 in | Wt 167.4 lb

## 2013-09-18 DIAGNOSIS — R5383 Other fatigue: Principal | ICD-10-CM

## 2013-09-18 DIAGNOSIS — R05 Cough: Secondary | ICD-10-CM | POA: Diagnosis not present

## 2013-09-18 DIAGNOSIS — R059 Cough, unspecified: Secondary | ICD-10-CM

## 2013-09-18 DIAGNOSIS — J3489 Other specified disorders of nose and nasal sinuses: Secondary | ICD-10-CM | POA: Diagnosis not present

## 2013-09-18 DIAGNOSIS — R5381 Other malaise: Secondary | ICD-10-CM

## 2013-09-18 DIAGNOSIS — R0982 Postnasal drip: Secondary | ICD-10-CM | POA: Diagnosis not present

## 2013-09-18 DIAGNOSIS — R0981 Nasal congestion: Secondary | ICD-10-CM

## 2013-09-18 LAB — POCT CBC
GRANULOCYTE PERCENT: 74.9 % (ref 37–80)
HCT, POC: 49.5 % — AB (ref 37.7–47.9)
Hemoglobin: 16.2 g/dL (ref 12.2–16.2)
LYMPH, POC: 1.8 (ref 0.6–3.4)
MCH, POC: 32.2 pg — AB (ref 27–31.2)
MCHC: 32.7 g/dL (ref 31.8–35.4)
MCV: 98.5 fL — AB (ref 80–97)
MID (cbc): 0.8 (ref 0–0.9)
MPV: 8.9 fL (ref 0–99.8)
POC GRANULOCYTE: 7.8 — AB (ref 2–6.9)
POC LYMPH %: 17.7 % (ref 10–50)
POC MID %: 7.4 % (ref 0–12)
Platelet Count, POC: 303 10*3/uL (ref 142–424)
RBC: 5.03 M/uL (ref 4.04–5.48)
RDW, POC: 13.3 %
WBC: 10.4 10*3/uL — AB (ref 4.6–10.2)

## 2013-09-18 MED ORDER — BENZONATATE 100 MG PO CAPS: 100.0000 mg | ORAL_CAPSULE | Freq: Two times a day (BID) | ORAL | Status: DC | PRN

## 2013-09-18 MED ORDER — IPRATROPIUM BROMIDE 0.03 % NA SOLN: 2.0000 | Freq: Four times a day (QID) | NASAL | Status: DC

## 2013-09-18 NOTE — Progress Notes (Signed)
[facility] [address], [place] [address] [phone]  Date:  09/18/2013   Name:  Sierra Carpenter   DOB:  May 06, 1945   MRN:  [id number]  PCP:  Jenny Reichmann, MD    Chief Complaint: Sinusitis   History of Present Illness:  Sierra Carpenter is a 68 y.o. very pleasant female patient who presents with the following:  Today is Wednesday.  On Monday she awoke feeling ill.  She was tired and had to take a nap.  Yesterday she noted congestion in her left sinuses, and had a cough last night.  Last night she had a lot of coughing that kept her awake.  This am she noted some PND and a hoarse voice.  The cough is slightly productive.    She has not noted a fever but did not check her temp.   No GI symptoms She has had some sneezing, not much.   Some runny nose as well.   history of HTN and hyperlipidemia- she had had a stress in 2012, echo 2012- all ok.   No history of glaucoma, she does have dry eyes but just uses OTC drops prn  Patient Active Problem List   Diagnosis Date Noted  . Coronary atherosclerosis 01/01/2013  . Hypertension 05/09/2011  . Hyperlipidemia 05/09/2011  . Colon polyps 05/09/2011  . Healthcare maintenance 05/09/2011    Past Medical History  Diagnosis Date  . Hyperlipidemia   . Hypertension   . Coronary atherosclerosis     without significant obstruction  . Aortic valve sclerosis   . History of nuclear stress test 07/12/2010    bruce protocol myoview; normal pattern of perfusion, low risk, post-stress EF 92%    Past Surgical History  Procedure Laterality Date  . Appendectomy    . Abdominal hysterectomy    . Cholecystectomy    . Cardiac catheterization      moderate stenosis in 1st diagonal & RCA  . Transthoracic echocardiogram  07/12/2010    EF=>55% with normal systolic function; trace MR/TR/PR    History  Substance Use Topics  . Smoking status: Never Smoker   . Smokeless tobacco: Not on file  . Alcohol Use: No    Family  History  Problem Relation Age of Onset  . Heart disease Father   . Heart disease Brother     x3  . Heart disease Son   . Heart disease Paternal Grandfather   . Alzheimer's disease Mother     No Known Allergies  Medication list has been reviewed and updated.  Current Outpatient Prescriptions on File Prior to Visit  Medication Sig Dispense Refill  . aspirin 81 MG tablet Take 160 mg by mouth daily.      Marland Kitchen atorvastatin (LIPITOR) 40 MG tablet Take 1 tablet (40 mg total) by mouth daily.  30 tablet  5  . fish oil-omega-3 fatty acids 1000 MG capsule Take 1 capsule by mouth 3 (three) times daily.      Marland Kitchen losartan-hydrochlorothiazide (HYZAAR) 50-12.5 MG per tablet TAKE ONE TABLET BY MOUTH EVERY DAY  90 tablet  2  . Multiple Vitamin (MULTIVITAMIN) tablet Take 1 tablet by mouth daily.       No current facility-administered medications on file prior to visit.    Review of Systems:  As per HPI- otherwise negative.   Physical Examination: Filed Vitals:   09/18/13 0847  BP: 132/82  Pulse: 79  Temp: 98.4 F (36.9 C)  Resp: 16  Filed Vitals:   09/18/13 0847  Height: 5' 1.5" (1.562 m)  Weight: 167 lb 6.4 oz (75.932 kg)   Body mass index is 31.12 kg/(m^2). Ideal Body Weight: Weight in (lb) to have BMI = 25: 134.2  GEN: WDWN, NAD, Non-toxic, A & O x 3, overweight, looks well HEENT: Atraumatic, Normocephalic. Neck supple. No masses, No LAD.  Bilateral TM wnl, oropharynx normal.  PEERL,EOMI.   Nasal cavity inflamed, sinuses non- tender to percussion Ears and Nose: No external deformity. CV: RRR, No M/G/R. No JVD. No thrill. No extra heart sounds. PULM: CTA B, no wheezes, crackles, rhonchi. No retractions. No resp. distress. No accessory muscle use. EXTR: No c/c/e NEURO Normal gait.  PSYCH: Normally interactive. Conversant. Not depressed or anxious appearing.  Calm demeanor.   Results for orders placed in visit on 09/18/13  POCT CBC      Result Value Ref Range   WBC 10.4 (*) 4.6 -  10.2 K/uL   Lymph, poc 1.8  0.6 - 3.4   POC LYMPH PERCENT 17.7  10 - 50 %L   MID (cbc) 0.8  0 - 0.9   POC MID % 7.4  0 - 12 %M   POC Granulocyte 7.8 (*) 2 - 6.9   Granulocyte percent 74.9  37 - 80 %G   RBC 5.03  4.04 - 5.48 M/uL   Hemoglobin 16.2  12.2 - 16.2 g/dL   HCT, POC 49.5 (*) 37.7 - 47.9 %   MCV 98.5 (*) 80 - 97 fL   MCH, POC 32.2 (*) 27 - 31.2 pg   MCHC 32.7  31.8 - 35.4 g/dL   RDW, POC 13.3     Platelet Count, POC 303  142 - 424 K/uL   MPV 8.9  0 - 99.8 fL    Assessment and Plan: Other malaise and fatigue - Plan: POCT CBC  Nasal congestion  PND (post-nasal drip) - Plan: ipratropium (ATROVENT) 0.03 % nasal spray  Cough - Plan: benzonatate (TESSALON) 100 MG capsule  Use atrovent nasal and tessalon as needed for likely viral infection.  Let me know if not bette soon- Sooner if worse.   See patient instructions for more details.     Signed Lamar Blinks, MD

## 2013-09-18 NOTE — Patient Instructions (Addendum)
Use the atrovent nasal spray as needed for post- nasal drip.  Try the tessalon perles as needed for cough.   If you are not feeling better in the next few days please let me know- Sooner if worse.  Give me a call or send me a mychart message!

## 2013-10-03 ENCOUNTER — Other Ambulatory Visit: Payer: Self-pay | Admitting: Cardiovascular Disease

## 2013-10-03 NOTE — Telephone Encounter (Signed)
Rx was sent to pharmacy electronically. 

## 2013-10-18 ENCOUNTER — Telehealth: Payer: Self-pay

## 2013-10-18 NOTE — Telephone Encounter (Signed)
Spoke to pt, she will need to get the note from her mothers PCP because they are aware of her condition and that Kenzli is her caregiver. She understood and will contact the PCP office.

## 2013-10-18 NOTE — Telephone Encounter (Signed)
PATIENT WOULD LIKE TO KNOW WHAT SHE NEEDS TO DO TO GET A LETTER THAT WILL EXCUSE HER FROM [employer]. HER MOTHER HAS DEMENTIA AND PATIENT STATES THAT SHE TAKES CARE OF HER MOTHER SEVERAL TIMES A WEEK. PLEASE CALL PATIENT! [phone]

## 2013-12-10 DIAGNOSIS — Z23 Encounter for immunization: Secondary | ICD-10-CM | POA: Diagnosis not present

## 2013-12-16 ENCOUNTER — Telehealth: Payer: Self-pay | Admitting: Cardiovascular Disease

## 2013-12-16 ENCOUNTER — Other Ambulatory Visit: Payer: Self-pay | Admitting: *Deleted

## 2013-12-16 DIAGNOSIS — E785 Hyperlipidemia, unspecified: Secondary | ICD-10-CM

## 2013-12-16 DIAGNOSIS — I2510 Atherosclerotic heart disease of native coronary artery without angina pectoris: Secondary | ICD-10-CM

## 2013-12-16 NOTE — Telephone Encounter (Signed)
Returned call to patient she stated she would like to have lab done before she sees Dr.Croitoru 01/10/14.Lab order mailed to patient.

## 2013-12-16 NOTE — Telephone Encounter (Signed)
Please put a lab order int he mail for her before her appt .Marland Kitchen Thanks

## 2013-12-24 DIAGNOSIS — E785 Hyperlipidemia, unspecified: Secondary | ICD-10-CM | POA: Diagnosis not present

## 2013-12-24 DIAGNOSIS — I2510 Atherosclerotic heart disease of native coronary artery without angina pectoris: Secondary | ICD-10-CM | POA: Diagnosis not present

## 2013-12-24 LAB — LIPID PANEL
Cholesterol: 173 mg/dL (ref 0–200)
HDL: 42 mg/dL (ref >39)
LDL CALC: 96 mg/dL (ref 0–99)
Total CHOL/HDL Ratio: 4.1 Ratio
Triglycerides: 176 mg/dL — ABNORMAL HIGH (ref <150)
VLDL: 35 mg/dL (ref 0–40)

## 2013-12-24 LAB — COMPREHENSIVE METABOLIC PANEL
ALBUMIN: 4.3 g/dL (ref 3.5–5.2)
ALK PHOS: 79 U/L (ref 39–117)
ALT: 20 U/L (ref 0–35)
AST: 16 U/L (ref 0–37)
BILIRUBIN TOTAL: 0.6 mg/dL (ref 0.2–1.2)
BUN: 20 mg/dL (ref 6–23)
CO2: 29 mEq/L (ref 19–32)
Calcium: 9.8 mg/dL (ref 8.4–10.5)
Chloride: 104 mEq/L (ref 96–112)
Creat: 1.03 mg/dL (ref 0.50–1.10)
Glucose, Bld: 103 mg/dL — ABNORMAL HIGH (ref 70–99)
POTASSIUM: 3.8 meq/L (ref 3.5–5.3)
SODIUM: 142 meq/L (ref 135–145)
TOTAL PROTEIN: 6.6 g/dL (ref 6.0–8.3)

## 2013-12-26 ENCOUNTER — Ambulatory Visit (INDEPENDENT_AMBULATORY_CARE_PROVIDER_SITE_OTHER): Payer: Medicare Other | Admitting: Family Medicine

## 2013-12-26 VITALS — BP 132/74 | HR 70 | Temp 97.8°F | Resp 16 | Ht 60.75 in | Wt 164.4 lb

## 2013-12-26 DIAGNOSIS — H6982 Other specified disorders of Eustachian tube, left ear: Secondary | ICD-10-CM

## 2013-12-26 DIAGNOSIS — H9190 Unspecified hearing loss, unspecified ear: Secondary | ICD-10-CM

## 2013-12-26 DIAGNOSIS — I2510 Atherosclerotic heart disease of native coronary artery without angina pectoris: Secondary | ICD-10-CM

## 2013-12-26 DIAGNOSIS — H9192 Unspecified hearing loss, left ear: Secondary | ICD-10-CM

## 2013-12-26 DIAGNOSIS — H6980 Other specified disorders of Eustachian tube, unspecified ear: Secondary | ICD-10-CM | POA: Diagnosis not present

## 2013-12-26 MED ORDER — LORATADINE 10 MG PO TABS: 10.0000 mg | ORAL_TABLET | Freq: Every day | ORAL | Status: DC

## 2013-12-26 NOTE — Progress Notes (Signed)
Subjective:  This chart was scribed for Sierra Forts, MD by Mercy Moore, Medial Scribe. This patient was seen in room 8 and the patient's care was started at 6:20 PM.   Patient ID: Sierra Carpenter, female    DOB: 05-Sep-1945, 68 y.o.   MRN: [id number]  12/26/2013  Otalgia and Facial Pain  HPI HPI Comments: Paeton J Manternach is a 67 y.o. female who presents to the [facility] complaining of left ear fullness with some pressure, ongoing for two days. She denies true pain, tinnitus, hearing loss, or cold symptoms.  Patient reports attempted treatment with blowing her nose, but she denies relief. Patient denies recent traveling or visits to the mountains.  Patient denies history of allergies.  Feels like R ear is starting to feel similarly.   Hearing in L ear is muffled.  Review of Systems  Constitutional: Negative for fever, chills, diaphoresis and fatigue.  HENT: Positive for hearing loss. Negative for congestion, ear discharge, ear pain, postnasal drip, rhinorrhea, sinus pressure, sneezing, sore throat, tinnitus, trouble swallowing and voice change.   Respiratory: Negative for cough.   Gastrointestinal: Negative for nausea.  Skin: Negative for rash.    Past Medical History  Diagnosis Date  . Hyperlipidemia   . Hypertension   . Coronary atherosclerosis     without significant obstruction  . Aortic valve sclerosis   . History of nuclear stress test 07/12/2010    bruce protocol myoview; normal pattern of perfusion, low risk, post-stress EF 92%   Past Surgical History  Procedure Laterality Date  . Appendectomy    . Abdominal hysterectomy    . Cholecystectomy    . Cardiac catheterization      moderate stenosis in 1st diagonal & RCA  . Transthoracic echocardiogram  07/12/2010    EF=>55% with normal systolic function; trace MR/TR/PR   No Known Allergies Current Outpatient Prescriptions  Medication Sig Dispense Refill  . aspirin 81 MG tablet Take 160 mg by mouth  daily.      Marland Kitchen atorvastatin (LIPITOR) 40 MG tablet TAKE ONE TABLET BY MOUTH ONCE DAILY  30 tablet  2  . co-enzyme Q-10 30 MG capsule Take 100 mg by mouth 2 (two) times daily.      . fish oil-omega-3 fatty acids 1000 MG capsule Take 1 capsule by mouth 3 (three) times daily.      Marland Kitchen losartan-hydrochlorothiazide (HYZAAR) 50-12.5 MG per tablet TAKE ONE TABLET BY MOUTH EVERY DAY  90 tablet  2  . Multiple Vitamin (MULTIVITAMIN) tablet Take 1 tablet by mouth daily.      Marland Kitchen ipratropium (ATROVENT) 0.03 % nasal spray Place 2 sprays into the nose 4 (four) times daily.  30 mL  6  . loratadine (CLARITIN) 10 MG tablet Take 1 tablet (10 mg total) by mouth daily.  30 tablet  1   No current facility-administered medications for this visit.       Objective:    BP 132/74  Pulse 70  Temp(Src) 97.8 F (36.6 C) (Oral)  Resp 16  Ht 5' 0.75" (1.543 m)  Wt 164 lb 6.4 oz (74.571 kg)  BMI 31.32 kg/m2  SpO2 97%  Physical Exam  Nursing note and vitals reviewed. Constitutional: She is oriented to person, place, and time. She appears well-developed and well-nourished. No distress.  HENT:  Head: Normocephalic and atraumatic.  Right Ear: External ear normal. Tympanic membrane is retracted.  Left Ear: External ear normal. Tympanic membrane is retracted.  Nose: Nose normal.  Mouth/Throat: Oropharynx is clear and moist. No oropharyngeal exudate.  Eyes: Conjunctivae and EOM are normal. Pupils are equal, round, and reactive to light.  Neck: Neck supple. No thyromegaly present.  Cardiovascular: Normal rate.   2/6 systolic murmur.  Pulmonary/Chest: Effort normal. No respiratory distress. She has no wheezes. She has no rales.  Musculoskeletal: Normal range of motion.  Lymphadenopathy:    She has no cervical adenopathy.  Neurological: She is alert and oriented to person, place, and time.  Skin: Skin is warm and dry. No rash noted. She is not diaphoretic.  Psychiatric: She has a normal mood and affect. Her behavior is  normal.   AUDIOMETRY COMPLETED IN OFFICE.     Assessment & Plan:   1. Eustachian tube dysfunction, right   2. Decreased hearing, left    1.  L Eustachian tube dysfunction:  New.  Rx for Claritin 10mg  daily; start Atrovent nasal spray bid.  If no improvement in two weeks, call office for referral to ENT.  2.  L hearing decreased:  Moderate hearing loss in L ear; RTC for acute worsening or if no improvement in two weeks.    Meds ordered this encounter  Medications  . co-enzyme Q-10 30 MG capsule    Sig: Take 100 mg by mouth 2 (two) times daily.  Marland Kitchen loratadine (CLARITIN) 10 MG tablet    Sig: Take 1 tablet (10 mg total) by mouth daily.    Dispense:  30 tablet    Refill:  1    No Follow-up on file.   I personally performed the services described in this documentation, which was scribed in my presence. The recorded information has been reviewed and is accurate.  Sierra Carpenter, M.D.  [facility] [address] [place], [place]  [address] [phone] phone [phone] fax

## 2014-01-03 ENCOUNTER — Other Ambulatory Visit: Payer: Self-pay

## 2014-01-03 MED ORDER — LOSARTAN POTASSIUM-HCTZ 50-12.5 MG PO TABS: 1.0000 | ORAL_TABLET | Freq: Every day | ORAL | Status: DC

## 2014-01-03 NOTE — Telephone Encounter (Signed)
Rx sent to pharmacy   

## 2014-01-06 ENCOUNTER — Other Ambulatory Visit: Payer: Self-pay | Admitting: Cardiovascular Disease

## 2014-01-20 ENCOUNTER — Encounter: Payer: Self-pay | Admitting: Cardiovascular Disease

## 2014-01-20 ENCOUNTER — Ambulatory Visit (INDEPENDENT_AMBULATORY_CARE_PROVIDER_SITE_OTHER): Payer: Medicare Other | Admitting: Cardiovascular Disease

## 2014-01-20 VITALS — BP 114/66 | HR 66 | Resp 16 | Ht 60.0 in | Wt 164.7 lb

## 2014-01-20 DIAGNOSIS — I10 Essential (primary) hypertension: Secondary | ICD-10-CM | POA: Diagnosis not present

## 2014-01-20 DIAGNOSIS — E782 Mixed hyperlipidemia: Secondary | ICD-10-CM | POA: Diagnosis not present

## 2014-01-20 DIAGNOSIS — I2510 Atherosclerotic heart disease of native coronary artery without angina pectoris: Secondary | ICD-10-CM | POA: Diagnosis not present

## 2014-01-20 DIAGNOSIS — Z79899 Other long term (current) drug therapy: Secondary | ICD-10-CM

## 2014-01-20 DIAGNOSIS — E785 Hyperlipidemia, unspecified: Secondary | ICD-10-CM

## 2014-01-20 NOTE — Patient Instructions (Signed)
Your physician recommends that you return for lab work in: [place].  Dr. Sallyanne Kuster recommends that you schedule a follow-up appointment in: One Year.

## 2014-01-21 ENCOUNTER — Telehealth: Payer: Self-pay | Admitting: *Deleted

## 2014-01-21 ENCOUNTER — Encounter: Payer: Self-pay | Admitting: Cardiovascular Disease

## 2014-01-21 NOTE — Progress Notes (Signed)
Patient ID: Sierra Carpenter, female   DOB: Sep 08, 1945, 68 y.o.   MRN: [id number]     Reason for office visit Coronary atherosclerosis, hyperlipidemia, hypertension  Sierra Carpenter is a 68 year old woman with a history of nonobstructive coronary atherosclerosis detected by angiography in 2000. At that time showed a 50% stenosis of an ostial diagonal artery and a 30% proximal right coronary artery narrowing. She has treated hyperlipidemia and hypertension and normal left ventricular systolic function. Her last nuclear stress test in April 2012 was a low risk study. She is here for routine followup and has not had any cardiac symptoms since her last appointment. Her latest lipid profile shows very mild hypertriglyceridemia her LDL cholesterol is less than 100. She has suffered a lot of personal tragedy within the last couple of years including the death of her husband.   No Known Allergies  Current Outpatient Prescriptions  Medication Sig Dispense Refill  . aspirin 81 MG tablet Take 160 mg by mouth daily.      Marland Kitchen atorvastatin (LIPITOR) 40 MG tablet TAKE ONE TABLET BY MOUTH ONCE DAILY  30 tablet  1  . co-enzyme Q-10 30 MG capsule Take 100 mg by mouth 2 (two) times daily.      . fish oil-omega-3 fatty acids 1000 MG capsule Take 1 capsule by mouth 3 (three) times daily.      Marland Kitchen ipratropium (ATROVENT) 0.03 % nasal spray Place 2 sprays into the nose 4 (four) times daily.  30 mL  6  . loratadine (CLARITIN) 10 MG tablet Take 1 tablet (10 mg total) by mouth daily.  30 tablet  1  . losartan-hydrochlorothiazide (HYZAAR) 50-12.5 MG per tablet Take 1 tablet by mouth daily.  90 tablet  0  . Multiple Vitamin (MULTIVITAMIN) tablet Take 1 tablet by mouth daily.       No current facility-administered medications for this visit.    Past Medical History  Diagnosis Date  . Hyperlipidemia   . Hypertension   . Coronary atherosclerosis     without significant obstruction  . Aortic valve sclerosis   . History of  nuclear stress test 07/12/2010    bruce protocol myoview; normal pattern of perfusion, low risk, post-stress EF 92%    Past Surgical History  Procedure Laterality Date  . Appendectomy    . Abdominal hysterectomy    . Cholecystectomy    . Cardiac catheterization      moderate stenosis in 1st diagonal & RCA  . Transthoracic echocardiogram  07/12/2010    EF=>55% with normal systolic function; trace MR/TR/PR    Family History  Problem Relation Age of Onset  . Heart disease Father   . Heart disease Brother     x3  . Heart disease Son   . Heart disease Paternal Grandfather   . Alzheimer's disease Mother     History   Social History  . Marital Status: Married    Spouse Name: N/A    Number of Children: 1  . Years of Education: 12   Occupational History  . Not on file.   Social History Main Topics  . Smoking status: Never Smoker   . Smokeless tobacco: Not on file  . Alcohol Use: No  . Drug Use: No  . Sexual Activity: Yes   Other Topics Concern  . Not on file   Social History Narrative  . No narrative on file    Review of systems: The patient specifically denies any chest pain at rest or with exertion,  dyspnea at rest or with exertion, orthopnea, paroxysmal nocturnal dyspnea, syncope, palpitations, focal neurological deficits, intermittent claudication, lower extremity edema, unexplained weight gain, cough, hemoptysis or wheezing.  The patient also denies abdominal pain, nausea, vomiting, dysphagia, diarrhea, constipation, polyuria, polydipsia, dysuria, hematuria, frequency, urgency, abnormal bleeding or bruising, fever, chills, unexpected weight changes, mood swings, change in skin or hair texture, change in voice quality, auditory or visual problems, allergic reactions or rashes, new musculoskeletal complaints other than usual "aches and pains".   PHYSICAL EXAM BP 114/66  Pulse 66  Resp 16  Ht 5' (1.524 m)  Wt 74.707 kg (164 lb 11.2 oz)  BMI 32.17 kg/m2  General:  Alert, oriented x3, no distress Head: no evidence of trauma, PERRL, EOMI, no exophtalmos or lid lag, no myxedema, no xanthelasma; normal ears, nose and oropharynx Neck: normal jugular venous pulsations and no hepatojugular reflux; brisk carotid pulses without delay and no carotid bruits Chest: clear to auscultation, no signs of consolidation by percussion or palpation, normal fremitus, symmetrical and full respiratory excursions Cardiovascular: normal position and quality of the apical impulse, regular rhythm, normal first and second heart sounds, 3/6 early peaking systolic ejection murmur in the aortic focus , rubs or gallops Abdomen: no tenderness or distention, no masses by palpation, no abnormal pulsatility or arterial bruits, normal bowel sounds, no hepatosplenomegaly Extremities: no clubbing, cyanosis or edema; 2+ radial, ulnar and brachial pulses bilaterally; 2+ right femoral, posterior tibial and dorsalis pedis pulses; 2+ left femoral, posterior tibial and dorsalis pedis pulses; no subclavian or femoral bruits Neurological: grossly nonfocal   EKG: Sinus rhythm, normal tracing  Lipid Panel     Component Value Date/Time   CHOL 173 12/24/2013 0746   TRIG 176* 12/24/2013 0746   HDL 42 12/24/2013 0746   CHOLHDL 4.1 12/24/2013 0746   VLDL 35 12/24/2013 0746   LDLCALC 96 12/24/2013 0746    BMET    Component Value Date/Time   NA 142 12/24/2013 0744   K 3.8 12/24/2013 0744   CL 104 12/24/2013 0744   CO2 29 12/24/2013 0744   GLUCOSE 103* 12/24/2013 0744   BUN 20 12/24/2013 0744   CREATININE 1.03 12/24/2013 0744   CALCIUM 9.8 12/24/2013 0744   GFRNONAA 52* 07/22/2011 1602   GFRAA 60 07/22/2011 1602     ASSESSMENT AND PLAN  Sierra Carpenter has asymptomatic mild coronary atherosclerosis and generally well-controlled risk factors. Additional improvement in her lipid parameters will be expected with weight loss. She has a plan to improve her diet and increase physical activity. She is having some  difficulty adjusting to "cooking for just one". Her blood pressure is well controlled. No changes in meds or medications today. We'll reevaluate her on a yearly basis. She has an unchanged murmur of aortic valve sclerosis and no symptoms to suggest aortic stenosis.  Orders Placed This Encounter  Procedures  . Comprehensive metabolic panel  . Lipid panel  . EKG 12-Lead   Meds ordered this encounter  Medications  . DISCONTD: Multiple Vitamins-Minerals (ICAPS) CAPS    Sig: Take by mouth daily.    Holli Humbles, MD, [place] [phone] office [phone] pager

## 2014-01-21 NOTE — Telephone Encounter (Signed)
Has not had labs done yet.  Told patient she does not need to repeat these labs.  Patient  voiced understanding and will throw order away.

## 2014-01-21 NOTE — Telephone Encounter (Signed)
Message copied by Tressa Busman on Tue Jan 21, 2014 12:47 PM ------      Message from: Sanda Klein      Created: Tue Jan 21, 2014  8:57 AM       Sierra Carpenter had labs performed in September. She does not need to have a new lipid panel and CMET. Hopefully she has not had them drawn yet and we can cancel the order ------

## 2014-03-19 ENCOUNTER — Other Ambulatory Visit: Payer: Self-pay | Admitting: Cardiovascular Disease

## 2014-03-20 ENCOUNTER — Other Ambulatory Visit: Payer: Self-pay | Admitting: Cardiovascular Disease

## 2014-03-20 MED ORDER — LOSARTAN POTASSIUM-HCTZ 50-12.5 MG PO TABS: 1.0000 | ORAL_TABLET | Freq: Every day | ORAL | Status: DC

## 2014-04-11 DIAGNOSIS — L720 Epidermal cyst: Secondary | ICD-10-CM | POA: Diagnosis not present

## 2014-06-13 ENCOUNTER — Ambulatory Visit (INDEPENDENT_AMBULATORY_CARE_PROVIDER_SITE_OTHER): Payer: Medicare Other | Admitting: Family Medicine

## 2014-06-13 VITALS — BP 140/78 | HR 63 | Temp 98.1°F | Resp 16 | Ht 61.25 in | Wt 166.8 lb

## 2014-06-13 DIAGNOSIS — H109 Unspecified conjunctivitis: Secondary | ICD-10-CM | POA: Diagnosis not present

## 2014-06-13 MED ORDER — POLYMYXIN B-TRIMETHOPRIM 10000-0.1 UNIT/ML-% OP SOLN: 1.0000 [drp] | OPHTHALMIC | Status: DC

## 2014-06-13 NOTE — Patient Instructions (Signed)
Use the eye drop around every 4 hours while awake for 5-7 days.  Let us know if you are not improving over the next couple of days- Sooner if worse.

## 2014-06-13 NOTE — Progress Notes (Signed)
[facility] [address] [place] [address] [phone]  Date:  06/13/2014   Name:  Sierra Carpenter   DOB:  Aug 13, 1945   MRN:  [id number]  PCP:  Jenny Reichmann, MD    Chief Complaint: Redness and drainage   History of Present Illness:  Sierra Carpenter is a 69 y.o. very pleasant female patient who presents with the following:  Here today with an eye concern.  She had noted her right eye watering at first, did not think too much of it.  In the morning the eye is sticky and crusty.   She does not see out of her left eye at baseline due to amblyopia.   The lower lid is tender, and can feel gritty.   Her vision is ok, no photophobia.  She wears just glasses; she does not use contacts She is generally in good health Her husband passed away last year after a long fight with emphysema    Patient Active Problem List   Diagnosis Date Noted  . Coronary atherosclerosis 01/01/2013  . Hypertension 05/09/2011  . Hyperlipidemia 05/09/2011  . Colon polyps 05/09/2011  . Healthcare maintenance 05/09/2011    Past Medical History  Diagnosis Date  . Hyperlipidemia   . Hypertension   . Coronary atherosclerosis     without significant obstruction  . Aortic valve sclerosis   . History of nuclear stress test 07/12/2010    bruce protocol myoview; normal pattern of perfusion, low risk, post-stress EF 92%    Past Surgical History  Procedure Laterality Date  . Appendectomy    . Abdominal hysterectomy    . Cholecystectomy    . Cardiac catheterization      moderate stenosis in 1st diagonal & RCA  . Transthoracic echocardiogram  07/12/2010    EF=>55% with normal systolic function; trace MR/TR/PR    History  Substance Use Topics  . Smoking status: Never Smoker   . Smokeless tobacco: Not on file  . Alcohol Use: No    Family History  Problem Relation Age of Onset  . Heart disease Father   . Heart disease Brother     x3  . Heart disease Son   . Heart disease Paternal  Grandfather   . Alzheimer's disease Mother     No Known Allergies  Medication list has been reviewed and updated.  Current Outpatient Prescriptions on File Prior to Visit  Medication Sig Dispense Refill  . aspirin 81 MG tablet Take 160 mg by mouth daily.    Marland Kitchen atorvastatin (LIPITOR) 40 MG tablet TAKE ONE TABLET BY MOUTH ONCE DAILY 30 tablet 12  . co-enzyme Q-10 30 MG capsule Take 100 mg by mouth 2 (two) times daily.    . fish oil-omega-3 fatty acids 1000 MG capsule Take 1 capsule by mouth 3 (three) times daily.    Marland Kitchen ipratropium (ATROVENT) 0.03 % nasal spray Place 2 sprays into the nose 4 (four) times daily. 30 mL 6  . loratadine (CLARITIN) 10 MG tablet Take 1 tablet (10 mg total) by mouth daily. 30 tablet 1  . losartan-hydrochlorothiazide (HYZAAR) 50-12.5 MG per tablet Take 1 tablet by mouth daily. 90 tablet 2  . Multiple Vitamin (MULTIVITAMIN) tablet Take 1 tablet by mouth daily.     No current facility-administered medications on file prior to visit.    Review of Systems:  As per HPI- otherwise negative.   Physical Examination: Filed Vitals:   06/13/14 1432  BP: 140/78  Pulse:  63  Temp: 98.1 F (36.7 C)  Resp: 16   Filed Vitals:   06/13/14 1432  Height: 5' 1.25" (1.556 m)  Weight: 166 lb 12.8 oz (75.66 kg)   Body mass index is 31.25 kg/(m^2). Ideal Body Weight: Weight in (lb) to have BMI = 25: 133.1  GEN: WDWN, NAD, Non-toxic, A & O x 3, overweight, looks well HEENT: Atraumatic, Normocephalic. Neck supple. No masses, No LAD.  Bilateral TM wnl, oropharynx normal.  PEERL,EOMI.   Right eye with injection of the lower conjunctivae and irritation of the right lower lid.   Ears and Nose: No external deformity. CV: RRR, No M/G/R. No JVD. No thrill. No extra heart sounds. PULM: CTA B, no wheezes, crackles, rhonchi. No retractions. No resp. distress. No accessory muscle use. EXTR: No c/c/e NEURO Normal gait.  PSYCH: Normally interactive. Conversant. Not depressed or  anxious appearing.  Calm demeanor.  Negative fluorescin right eye Normal limited fundoscopic exam bilaterally   Assessment and Plan: Conjunctivitis, right eye - Plan: trimethoprim-polymyxin b (POLYTRIM) ophthalmic solution  Conjunctivitis/ mild blepharitis of the right eye.  Treat with polytrim and baby shampoo lid washes.  She will follow-up if not better soon  Signed Lamar Blinks, MD

## 2014-07-16 ENCOUNTER — Ambulatory Visit (INDEPENDENT_AMBULATORY_CARE_PROVIDER_SITE_OTHER): Payer: Medicare Other | Admitting: Internal Medicine

## 2014-07-16 ENCOUNTER — Ambulatory Visit (INDEPENDENT_AMBULATORY_CARE_PROVIDER_SITE_OTHER): Payer: Medicare Other

## 2014-07-16 ENCOUNTER — Encounter: Payer: Self-pay | Admitting: Internal Medicine

## 2014-07-16 VITALS — BP 124/84 | HR 74 | Temp 97.9°F | Resp 16 | Ht 61.0 in | Wt 169.0 lb

## 2014-07-16 DIAGNOSIS — M545 Low back pain, unspecified: Secondary | ICD-10-CM

## 2014-07-16 NOTE — Patient Instructions (Signed)
We will call you about an appointment with  Dr. Lynann Bologna who is located at [facility] and [facility] His address is [address]

## 2014-07-16 NOTE — Progress Notes (Signed)
Subjective:    Patient ID: Sierra Carpenter, female    DOB: 1945-06-15, 69 y.o.   MRN: [id number]  HPI This is a very pleasant 69 yo female who presents today with an exacerbation of chronic back pain. She has had nearly daily pain for quite awhile, but hasn't had any time to take care of herself. She has been seen twice and given exercises which she has not found helpful. She has pain with mopping, sweeping or yard work. She was walking 2 miles a day, but has been having more sensation of "bone on bone," pain. Takes 2 alleve occasionally with some relief. Mornings are often rough and she is very stiff- this gets better with getting up and moving around or hot shower. Uses some Biofreeze. Has occasional pain into inner thighs. Occasionally has "poping" sensation and severe pain then has some relief.   She lost her husband a year ago and had other losses within the last year- younger brother and best friend. She put her mother in assisted living recently and her only living brother is terminally ill. She definitely thinks her pain is worse with her increased stress.   Past Medical History  Diagnosis Date  . Hyperlipidemia   . Hypertension   . Coronary atherosclerosis     without significant obstruction  . Aortic valve sclerosis   . History of nuclear stress test 07/12/2010    bruce protocol myoview; normal pattern of perfusion, low risk, post-stress EF 92%   Past Surgical History  Procedure Laterality Date  . Appendectomy    . Abdominal hysterectomy    . Cholecystectomy    . Cardiac catheterization      moderate stenosis in 1st diagonal & RCA  . Transthoracic echocardiogram  07/12/2010    EF=>55% with normal systolic function; trace MR/TR/PR   Family History  Problem Relation Age of Onset  . Heart disease Father   . Heart disease Brother     x3  . Heart disease Son   . Heart disease Paternal Grandfather   . Alzheimer's disease Mother    History  Substance Use Topics  . Smoking  status: Never Smoker   . Smokeless tobacco: Not on file  . Alcohol Use: No    Review of Systems No falls, no weakness, no numbness or tingling, sometimes has pain into inner thighs, no chest pain, no SOB, no swelling, no fever, no cough    Objective:   Physical Exam  Constitutional: She is oriented to person, place, and time. She appears well-developed and well-nourished.  Obese   HENT:  Head: Normocephalic and atraumatic.  Eyes: Conjunctivae are normal.  Neck: Normal range of motion. Neck supple.  Cardiovascular: Normal rate, regular rhythm and normal heart sounds.   Pulmonary/Chest: Effort normal and breath sounds normal.  Abdominal: Soft. Bowel sounds are normal.  Musculoskeletal: Normal range of motion.       Lumbar back: She exhibits normal range of motion, no tenderness, no bony tenderness, no swelling, no deformity, no pain and no spasm.  Right leg straight leg raise to 70 degrees before the patient experienced some mild pain. No pain with left straight leg raise. Hip flexibility and strength good.   Neurological: She is alert and oriented to person, place, and time. She has normal reflexes.  Skin: Skin is warm and dry.  Psychiatric: She has a normal mood and affect. Her behavior is normal. Judgment and thought content normal.  Vitals reviewed.   BP 124/84 mmHg  Pulse 74  Temp(Src) 97.9 F (36.6 C)  Resp 16  Ht 5\' 1"  (1.549 m)  Wt 169 lb (76.658 kg)  BMI 31.95 kg/m2  SpO2 96%   Lumbar films. [facility] reading (PRIMARY) by  Dr. Laney Pastor- Narrowing of joint spaces, degenerative changes, abnormal curve on PA,? Spasm,      Assessment & Plan:  1. Midline low back pain without sciatica - DG Lumbar Spine Complete; Future - Ambulatory referral to Orthopedic Surgery- Dr. Lynann Bologna  2. Obesity - reviewed patient's typical food intake and discussed ways for her to decrease her sugar/carbohydrate intake. Encouraged regular meals and snacks.     Elby Beck,  FNP-BC  [facility]  07/17/2014 11:05 PM I have participated in the care of this patient with the Advanced Practice Provider and agree with Diagnosis and Plan as documented. Robert P. Laney Pastor, M.D.

## 2014-07-17 ENCOUNTER — Telehealth: Payer: Self-pay

## 2014-07-17 NOTE — Telephone Encounter (Signed)
Patient is calling to check on the referral status. Patient was informed that appointment has not been made yet and she will receive a phone call. Patient phone:  [phone]

## 2014-07-29 DIAGNOSIS — M545 Low back pain: Secondary | ICD-10-CM | POA: Diagnosis not present

## 2014-07-30 ENCOUNTER — Ambulatory Visit (INDEPENDENT_AMBULATORY_CARE_PROVIDER_SITE_OTHER): Payer: Medicare Other | Admitting: Family Medicine

## 2014-07-30 VITALS — BP 112/70 | HR 89 | Temp 98.6°F | Resp 20 | Ht 61.0 in | Wt 167.8 lb

## 2014-07-30 DIAGNOSIS — J209 Acute bronchitis, unspecified: Secondary | ICD-10-CM | POA: Diagnosis not present

## 2014-07-30 MED ORDER — AZITHROMYCIN 250 MG PO TABS: ORAL_TABLET | ORAL | Status: DC

## 2014-07-30 MED ORDER — HYDROCODONE-HOMATROPINE 5-1.5 MG/5ML PO SYRP: 5.0000 mL | ORAL_SOLUTION | Freq: Three times a day (TID) | ORAL | Status: DC | PRN

## 2014-07-30 NOTE — Progress Notes (Signed)
Subjective:  This chart was scribed for Sierra Carpenter,  by [name], at [facility].  This patient was seen in room 13 and the patient's care was started at 10:12 AM.    Patient ID: Sierra Carpenter, female    DOB: April 23, 1945, 69 y.o.   MRN: [id number] Chief Complaint  Patient presents with   Cough    x 4 days   Headache    x 4 days   Medication Refill    unsure of the exact name of medication, was prescribed by orthopedic doctor     HPI   HPI Comments: Sierra Carpenter is a 69 y.o. female who [facility] for a cough onset four days ago.  She was not able to sleep at all last night and states that the robitussin has not alleviated her cough.  She has associated symptoms of a dry throat and green mucous.  She states she can hear the rattling sound in her chest. She denies a fever/chills, sore throat.   She is currently not working.  Her mother has alzheimers and is in a home.  She stays busy taking care of her mother and her brother who is under hospice care. She is going to start physical therapy soon for her back. She has no other complaints today.      Patient Active Problem List   Diagnosis Date Noted   Coronary atherosclerosis 01/01/2013   Hypertension 05/09/2011   Hyperlipidemia 05/09/2011   Colon polyps 05/09/2011   Healthcare maintenance 05/09/2011   Past Medical History  Diagnosis Date   Hyperlipidemia    Hypertension    Coronary atherosclerosis     without significant obstruction   Aortic valve sclerosis    History of nuclear stress test 07/12/2010    bruce protocol myoview; normal pattern of perfusion, low risk, post-stress EF 92%   Past Surgical History  Procedure Laterality Date   Appendectomy     Abdominal hysterectomy     Cholecystectomy     Cardiac catheterization      moderate stenosis in 1st diagonal & RCA   Transthoracic echocardiogram  07/12/2010    EF=>55% with normal  systolic function; trace MR/TR/PR   No Known Allergies Prior to Admission medications   Medication Sig Start Date End Date Taking? Authorizing Provider  aspirin 81 MG tablet Take 160 mg by mouth daily.   Yes Historical Provider, Carpenter  atorvastatin (LIPITOR) 40 MG tablet TAKE ONE TABLET BY MOUTH ONCE DAILY 03/19/14  Yes Mihai Croitoru, Carpenter  co-enzyme Q-10 30 MG capsule Take 100 mg by mouth 2 (two) times daily.   Yes Historical Provider, Carpenter  fish oil-omega-3 fatty acids 1000 MG capsule Take 1 capsule by mouth 3 (three) times daily.   Yes Historical Provider, Carpenter  losartan-hydrochlorothiazide (HYZAAR) 50-12.5 MG per tablet Take 1 tablet by mouth daily. 03/20/14  Yes Mihai Croitoru, Carpenter  Multiple Vitamin (MULTIVITAMIN) tablet Take 1 tablet by mouth daily.   Yes Historical Provider, Carpenter   History   Social History   Marital Status: Married    Spouse Name: N/A   Number of Children: 1   Years of Education: 12   Occupational History   Not on file.   Social History Main Topics   Smoking status: Never Smoker    Smokeless tobacco: Not on file   Alcohol Use: No   Drug Use: No   Sexual Activity: Yes   Other Topics  Concern   Not on file   Social History Narrative      Review of Systems  Constitutional: Negative for fever and chills.  Respiratory: Positive for cough.   Gastrointestinal: Negative for nausea and vomiting.       Objective:   Physical Exam   No acute distress Filed Vitals:   07/30/14 0932  BP: 112/70  Pulse: 89  Temp: 98.6 F (37 C)  TempSrc: Oral  Resp: 20  Height: 5\' 1"  (1.549 m)  Weight: 167 lb 12.8 oz (76.114 kg)  SpO2: 96%   Patient's a delightful elderly woman with obvious congested cough HEENT: Unremarkable Neck: Supple no adenopathy Chest: Expiratory wheezes bilaterally with no rales Heart: Regular no murmur     Assessment & Plan:   This chart was scribed in my presence and reviewed by me personally.    ICD-9-CM ICD-10-CM   1. Acute  bronchitis, unspecified organism 466.0 J20.9 azithromycin (ZITHROMAX Z-PAK) 250 MG tablet     HYDROcodone-homatropine (HYCODAN) 5-1.5 MG/5ML syrup     Signed, Sierra Haber, Carpenter

## 2014-07-30 NOTE — Patient Instructions (Signed)

## 2014-07-31 DIAGNOSIS — M545 Low back pain: Secondary | ICD-10-CM | POA: Diagnosis not present

## 2014-08-05 DIAGNOSIS — M545 Low back pain: Secondary | ICD-10-CM | POA: Diagnosis not present

## 2014-08-06 ENCOUNTER — Ambulatory Visit (INDEPENDENT_AMBULATORY_CARE_PROVIDER_SITE_OTHER): Payer: Medicare Other

## 2014-08-06 ENCOUNTER — Ambulatory Visit (INDEPENDENT_AMBULATORY_CARE_PROVIDER_SITE_OTHER): Payer: Medicare Other | Admitting: Physician Assistant

## 2014-08-06 VITALS — BP 114/68 | HR 89 | Temp 97.5°F | Resp 17 | Ht 61.5 in | Wt 166.0 lb

## 2014-08-06 DIAGNOSIS — R011 Cardiac murmur, unspecified: Secondary | ICD-10-CM

## 2014-08-06 DIAGNOSIS — J189 Pneumonia, unspecified organism: Secondary | ICD-10-CM | POA: Diagnosis not present

## 2014-08-06 DIAGNOSIS — R0989 Other specified symptoms and signs involving the circulatory and respiratory systems: Secondary | ICD-10-CM

## 2014-08-06 DIAGNOSIS — Z1389 Encounter for screening for other disorder: Secondary | ICD-10-CM | POA: Diagnosis not present

## 2014-08-06 DIAGNOSIS — I2510 Atherosclerotic heart disease of native coronary artery without angina pectoris: Secondary | ICD-10-CM | POA: Diagnosis not present

## 2014-08-06 DIAGNOSIS — I10 Essential (primary) hypertension: Secondary | ICD-10-CM | POA: Diagnosis not present

## 2014-08-06 DIAGNOSIS — R05 Cough: Secondary | ICD-10-CM

## 2014-08-06 DIAGNOSIS — R059 Cough, unspecified: Secondary | ICD-10-CM

## 2014-08-06 DIAGNOSIS — E669 Obesity, unspecified: Secondary | ICD-10-CM | POA: Diagnosis not present

## 2014-08-06 DIAGNOSIS — R0600 Dyspnea, unspecified: Secondary | ICD-10-CM | POA: Diagnosis not present

## 2014-08-06 LAB — COMPREHENSIVE METABOLIC PANEL
ALBUMIN: 4.2 g/dL (ref 3.5–5.2)
ALT: 15 U/L (ref 0–35)
AST: 14 U/L (ref 0–37)
Alkaline Phosphatase: 111 U/L (ref 39–117)
BUN: 22 mg/dL (ref 6–23)
CALCIUM: 9.8 mg/dL (ref 8.4–10.5)
CHLORIDE: 102 meq/L (ref 96–112)
CO2: 25 meq/L (ref 19–32)
Creat: 1.06 mg/dL (ref 0.50–1.10)
GLUCOSE: 101 mg/dL — AB (ref 70–99)
POTASSIUM: 4.2 meq/L (ref 3.5–5.3)
Sodium: 141 mEq/L (ref 135–145)
TOTAL PROTEIN: 6.9 g/dL (ref 6.0–8.3)
Total Bilirubin: 0.5 mg/dL (ref 0.2–1.2)

## 2014-08-06 LAB — POCT CBC
Granulocyte percent: 70.2 %G (ref 37–80)
HCT, POC: 46.2 % (ref 37.7–47.9)
Hemoglobin: 15.5 g/dL (ref 12.2–16.2)
Lymph, poc: 2.9 (ref 0.6–3.4)
MCH, POC: 31.4 pg — AB (ref 27–31.2)
MCHC: 33.6 g/dL (ref 31.8–35.4)
MCV: 93.5 fL (ref 80–97)
MID (CBC): 0.7 (ref 0–0.9)
MPV: 7.3 fL (ref 0–99.8)
PLATELET COUNT, POC: 481 10*3/uL — AB (ref 142–424)
POC GRANULOCYTE: 8.4 — AB (ref 2–6.9)
POC LYMPH PERCENT: 24.1 %L (ref 10–50)
POC MID %: 5.7 % (ref 0–12)
RBC: 4.94 M/uL (ref 4.04–5.48)
RDW, POC: 13.1 %
WBC: 11.9 10*3/uL — AB (ref 4.6–10.2)

## 2014-08-06 MED ORDER — LEVOFLOXACIN 750 MG PO TABS: 750.0000 mg | ORAL_TABLET | Freq: Every day | ORAL | Status: DC

## 2014-08-06 NOTE — Progress Notes (Signed)
08/06/2014 at 8:25 PM  Liah J Arico / DOB: 05-Mar-1946 / MRN: [id number]  The patient has Hypertension; Hyperlipidemia; Colon polyps; Healthcare maintenance; and Coronary atherosclerosis on her problem list.  SUBJECTIVE  Chief complaint: Cough and URI   Locklyn J Stryker is a 69 y.o. female complaining of chest congestion that started 7 days ago.  Associated symptoms include congestion, cough and headache today, and she denies fever, sore throat, difficulty breathing and jaw pain.The patient symptoms show no change. Treatments tried thus far include Z-pak and delsym with poor relief. She reports sick contacts. She denies orthopnea, DOE, SOB, chest pain, palpitations. She denies abdominal fullness. She has a history of heart murmur and was evaluated by cardiology in the last year and was told there were no changes in her cardiac health. She is due for a repeat echo this year.    She  has a past medical history of Hyperlipidemia; Hypertension; Coronary atherosclerosis; Aortic valve sclerosis; and History of nuclear stress test (07/12/2010).    Medications reviewed and updated by myself where necessary, and exist elsewhere in the encounter.   Ms. Paff has No Known Allergies. She  reports that she has never smoked. She does not have any smokeless tobacco history on file. She reports that she does not drink alcohol or use illicit drugs. She  reports that she currently engages in sexual activity. The patient  has past surgical history that includes Appendectomy; Abdominal hysterectomy; Cholecystectomy; Cardiac catheterization; and transthoracic echocardiogram (07/12/2010).  Her family history includes Alzheimer's disease in her mother; Heart disease in her brother, father, paternal grandfather, and son.  Review of Systems  Constitutional: Negative for chills.  Respiratory: Negative for hemoptysis and wheezing.   Gastrointestinal: Negative for nausea.  Genitourinary: Negative.   Skin: Negative for rash.   Neurological: Negative for dizziness.    OBJECTIVE  Her  height is 5' 1.5" (1.562 m) and weight is 166 lb (75.297 kg). Her oral temperature is 97.5 F (36.4 C). Her blood pressure is 114/68 and her pulse is 89. Her respiration is 17 and oxygen saturation is 97%.  The patient's body mass index is 30.86 kg/(m^2).  Physical Exam  Constitutional: She is oriented to person, place, and time. She appears well-developed.  HENT:  Right Ear: External ear normal.  Left Ear: External ear normal.  Neck: Normal range of motion.  Cardiovascular: Normal rate.   Murmur (3/6 systolic ejection murmur best heard at the left sternal border in the aortic field) heard. Respiratory: Effort normal. No respiratory distress. She has no wheezes. She has rales (right lower field). She exhibits no tenderness.  Neurological: She is alert and oriented to person, place, and time.  Skin: Skin is warm and dry. She is not diaphoretic.     Psychiatric: She has a normal mood and affect.    Results for orders placed or performed in visit on 08/06/14 (from the past 24 hour(s))  POCT CBC     Status: Abnormal   Collection Time: 08/06/14  8:57 AM  Result Value Ref Range   WBC 11.9 (A) 4.6 - 10.2 K/uL   Lymph, poc 2.9 0.6 - 3.4   POC LYMPH PERCENT 24.1 10 - 50 %L   MID (cbc) 0.7 0 - 0.9   POC MID % 5.7 0 - 12 %M   POC Granulocyte 8.4 (A) 2 - 6.9   Granulocyte percent 70.2 37 - 80 %G   RBC 4.94 4.04 - 5.48 M/uL   Hemoglobin 15.5 12.2 -  16.2 g/dL   HCT, POC 46.2 37.7 - 47.9 %   MCV 93.5 80 - 97 fL   MCH, POC 31.4 (A) 27 - 31.2 pg   MCHC 33.6 31.8 - 35.4 g/dL   RDW, POC 13.1 %   Platelet Count, POC 481 (A) 142 - 424 K/uL   MPV 7.3 0 - 99.8 fL   [facility] reading (PRIMARY) by  Dr. Elder Cyphers: Patchy infiltrate in right lower lobe questionable for pneumonia.    ASSESSMENT & PLAN  Waynetta was seen today for cough and uri.  Diagnoses and all orders for this visit:  Cough Orders: -     Brain natriuretic peptide -     POCT  CBC -     DG Chest 2 View; Future -     Comprehensive metabolic panel  Heart murmur Orders: -     Brain natriuretic peptide  Chest rales Orders: -     Brain natriuretic peptide  Pneumonia, organism unspecified Orders: -     levofloxacin (LEVAQUIN) 750 MG tablet; Take 1 tablet (750 mg total) by mouth daily. -     Patient to follow up with Dr. Elder Cyphers in 48 hours.     The patient was advised to call or come back to clinic if she does not see an improvement in symptoms, or worsens with the above plan.   Philis Fendt, MHS, PA-C [facility] 08/06/2014 8:25 PM

## 2014-08-07 DIAGNOSIS — M545 Low back pain: Secondary | ICD-10-CM | POA: Diagnosis not present

## 2014-08-07 LAB — BRAIN NATRIURETIC PEPTIDE: BRAIN NATRIURETIC PEPTIDE: 10.6 pg/mL (ref 0.0–100.0)

## 2014-08-08 ENCOUNTER — Ambulatory Visit (INDEPENDENT_AMBULATORY_CARE_PROVIDER_SITE_OTHER): Payer: Medicare Other | Admitting: Internal Medicine

## 2014-08-08 VITALS — BP 124/80 | HR 91 | Temp 98.0°F | Resp 17 | Ht 61.0 in | Wt 167.0 lb

## 2014-08-08 DIAGNOSIS — J189 Pneumonia, unspecified organism: Secondary | ICD-10-CM

## 2014-08-08 NOTE — Progress Notes (Signed)
   Subjective:    Patient ID: Sierra Carpenter, female    DOB: 09-Aug-1945, 69 y.o.   MRN: [id number]  HPI Pneumonia confirmed by radiology on cxr. Need f/up cxr to see complete clearing in 1 month. Cough is lot less, antibiotics are tolerated and working.   Review of Systems     Objective:   Physical Exam  Constitutional: She is oriented to person, place, and time. She appears well-developed and well-nourished. No distress.  HENT:  Head: Normocephalic.  Right Ear: External ear normal.  Left Ear: External ear normal.  Nose: Nose normal.  Mouth/Throat: Oropharynx is clear and moist.  Eyes: EOM are normal.  Neck: Normal range of motion. Neck supple.  Cardiovascular: Normal rate.   Pulmonary/Chest: Effort normal. She has no decreased breath sounds. She has no wheezes. She has rhonchi. She has no rales.  Neurological: She is alert and oriented to person, place, and time. She exhibits normal muscle tone. Coordination normal.  Psychiatric: She has a normal mood and affect. Her behavior is normal.          Assessment & Plan:  Bacterial pneumonia/Clinically improving rapidly

## 2014-08-08 NOTE — Patient Instructions (Signed)

## 2014-08-12 DIAGNOSIS — M545 Low back pain: Secondary | ICD-10-CM | POA: Diagnosis not present

## 2014-08-14 DIAGNOSIS — M545 Low back pain: Secondary | ICD-10-CM | POA: Diagnosis not present

## 2014-08-19 DIAGNOSIS — M545 Low back pain: Secondary | ICD-10-CM | POA: Diagnosis not present

## 2014-08-21 DIAGNOSIS — M545 Low back pain: Secondary | ICD-10-CM | POA: Diagnosis not present

## 2014-09-08 DIAGNOSIS — Z1231 Encounter for screening mammogram for malignant neoplasm of breast: Secondary | ICD-10-CM | POA: Diagnosis not present

## 2014-09-11 DIAGNOSIS — I10 Essential (primary) hypertension: Secondary | ICD-10-CM | POA: Diagnosis not present

## 2014-09-11 DIAGNOSIS — H5201 Hypermetropia, right eye: Secondary | ICD-10-CM | POA: Diagnosis not present

## 2014-09-11 DIAGNOSIS — H3531 Nonexudative age-related macular degeneration: Secondary | ICD-10-CM | POA: Diagnosis not present

## 2014-09-11 DIAGNOSIS — H35033 Hypertensive retinopathy, bilateral: Secondary | ICD-10-CM | POA: Diagnosis not present

## 2014-09-12 NOTE — Addendum Note (Signed)
Addended by: Constance Goltz on: 09/12/2014 03:44 PM   Modules accepted: Miquel Dunn

## 2014-12-18 ENCOUNTER — Encounter: Payer: Self-pay | Admitting: Emergency Medicine

## 2014-12-18 ENCOUNTER — Ambulatory Visit (INDEPENDENT_AMBULATORY_CARE_PROVIDER_SITE_OTHER): Payer: Medicare Other | Admitting: Emergency Medicine

## 2014-12-18 VITALS — BP 144/80 | HR 77 | Temp 98.1°F | Resp 16 | Ht 61.0 in | Wt 166.2 lb

## 2014-12-18 DIAGNOSIS — Z1159 Encounter for screening for other viral diseases: Secondary | ICD-10-CM

## 2014-12-18 DIAGNOSIS — I10 Essential (primary) hypertension: Secondary | ICD-10-CM | POA: Diagnosis not present

## 2014-12-18 DIAGNOSIS — E785 Hyperlipidemia, unspecified: Secondary | ICD-10-CM | POA: Diagnosis not present

## 2014-12-18 DIAGNOSIS — Z23 Encounter for immunization: Secondary | ICD-10-CM

## 2014-12-18 DIAGNOSIS — Z114 Encounter for screening for human immunodeficiency virus [HIV]: Secondary | ICD-10-CM

## 2014-12-18 NOTE — Addendum Note (Signed)
Addended by: Kem Boroughs D on: 12/18/2014 12:03 PM   Modules accepted: Orders

## 2014-12-18 NOTE — Progress Notes (Signed)
Subjective:  This chart was scribed for Darlyne Russian, MD by [name], at [facility].  This patient was seen in room 21 and the patient's care was started at 9:09 AM.    Patient ID: Sierra Carpenter, female    DOB: 01/07/1946, 69 y.o.   MRN: [id number] Chief Complaint  Patient presents with  . Hypertension  . Hyperlipidemia    HPI  HPI Comments: Sierra Carpenter is a 69 y.o. female who presents to the [facility] for a follow up regarding hypertension and hyperlipidemia.  Patient states that she is doing "ok" but is under a lot of stress with her mother (has alzheimers- who is now in a nursing facility) and brother (may potentially go into hospice care).  Her husband passed in 2014.  Patient states that she is unable to walk easily due to her back problems.  She has been to the orthopedist who gave her exercises and is trying to adjust to sleeping on her back. Patient got her flu shot today.  Patient got her shingles vaccination in 2013. She will receive pneumo 23 today as well.    Blood pressure left arm: 134/74   Patient Active Problem List   Diagnosis Date Noted  . Coronary atherosclerosis 01/01/2013  . Hypertension 05/09/2011  . Hyperlipidemia 05/09/2011  . Colon polyps 05/09/2011  . Healthcare maintenance 05/09/2011   Past Medical History  Diagnosis Date  . Hyperlipidemia   . Hypertension   . Coronary atherosclerosis     without significant obstruction  . Aortic valve sclerosis   . History of nuclear stress test 07/12/2010    bruce protocol myoview; normal pattern of perfusion, low risk, post-stress EF 92%   Past Surgical History  Procedure Laterality Date  . Appendectomy    . Abdominal hysterectomy    . Cholecystectomy    . Cardiac catheterization      moderate stenosis in 1st diagonal & RCA  . Transthoracic echocardiogram  07/12/2010    EF=>55% with normal systolic function; trace MR/TR/PR   No Known Allergies Prior to  Admission medications   Medication Sig Start Date End Date Taking? Authorizing Provider  aspirin 81 MG tablet Take 160 mg by mouth daily.   Yes Historical Provider, MD  atorvastatin (LIPITOR) 40 MG tablet TAKE ONE TABLET BY MOUTH ONCE DAILY 03/19/14  Yes Mihai Croitoru, MD  co-enzyme Q-10 30 MG capsule Take 100 mg by mouth 2 (two) times daily.   Yes Historical Provider, MD  fish oil-omega-3 fatty acids 1000 MG capsule Take 1 capsule by mouth 3 (three) times daily.   Yes Historical Provider, MD  losartan-hydrochlorothiazide (HYZAAR) 50-12.5 MG per tablet Take 1 tablet by mouth daily. 03/20/14  Yes Mihai Croitoru, MD  Multiple Vitamin (MULTIVITAMIN) tablet Take 1 tablet by mouth daily.   Yes Historical Provider, MD   Social History   Social History  . Marital Status: Married    Spouse Name: N/A  . Number of Children: 1  . Years of Education: 12   Occupational History  . Not on file.   Social History Main Topics  . Smoking status: Never Smoker   . Smokeless tobacco: Not on file  . Alcohol Use: No  . Drug Use: No  . Sexual Activity: Yes   Other Topics Concern  . Not on file   Social History Narrative    Review of Systems  Constitutional: Negative for fever and chills.  Respiratory: Negative for  cough and choking.   Gastrointestinal: Negative for nausea and vomiting.  Musculoskeletal: Negative for neck pain and neck stiffness.       Objective:   Physical Exam  CONSTITUTIONAL: Well developed/well nourished HEAD: Normocephalic/atraumatic EYES: EOMI/PERRL ENMT: Mucous membranes moist NECK: supple no meningeal signs SPINE/BACK:entire spine nontender CV: 2/6 systolic murmur at the base of the heart.  LUNGS: Lungs are clear to auscultation bilaterally, no apparent distress NEURO: Pt is awake/alert/appropriate, moves all extremitiesx4.  No facial droop.   EXTREMITIES: pulses normal/equal, full ROM SKIN: warm, color normal PSYCH: no abnormalities of mood noted, alert and  oriented to situation   Filed Vitals:   12/18/14 0852  BP: 144/80  Pulse: 77  Temp: 98.1 F (36.7 C)  TempSrc: Oral  Resp: 16  Height: 5\' 1"  (1.549 m)  Weight: 166 lb 3.2 oz (75.388 kg)      Assessment & Plan:  Patient initially felt this was her wellness exam but that is not scheduled until January. She was given her flu shot and 23 today she has previously had Prevnar 13. She states she is up-to-date on her other vaccinations. Will discuss test routine healthcare on her January appointment. She does have an appointment with Dr. Carson Myrtle.I personally performed the services described in this documentation, which was scribed in my presence. The recorded information has been reviewed and is accurate.

## 2014-12-30 ENCOUNTER — Other Ambulatory Visit: Payer: Self-pay | Admitting: Cardiovascular Disease

## 2014-12-30 NOTE — Telephone Encounter (Signed)
REFILL 

## 2015-01-06 ENCOUNTER — Encounter: Payer: Self-pay | Admitting: Emergency Medicine

## 2015-01-29 ENCOUNTER — Ambulatory Visit (INDEPENDENT_AMBULATORY_CARE_PROVIDER_SITE_OTHER): Payer: Medicare Other | Admitting: Cardiovascular Disease

## 2015-01-29 VITALS — BP 148/76 | HR 60 | Resp 16 | Ht 61.0 in | Wt 170.0 lb

## 2015-01-29 DIAGNOSIS — I10 Essential (primary) hypertension: Secondary | ICD-10-CM

## 2015-01-29 DIAGNOSIS — Z79899 Other long term (current) drug therapy: Secondary | ICD-10-CM | POA: Diagnosis not present

## 2015-01-29 DIAGNOSIS — E785 Hyperlipidemia, unspecified: Secondary | ICD-10-CM

## 2015-01-29 NOTE — Progress Notes (Signed)
Patient ID: Sierra Carpenter, female   DOB: November 19, 1945, 69 y.o.   MRN: [id number]     Cardiology Office Note   Date:  01/30/2015   ID:  Sierra Carpenter, DOB 04/15/1945, MRN [id number]  PCP:  Jenny Reichmann, MD  Cardiologist:   Sanda Klein, MD   Chief Complaint  Patient presents with  . Annual Exam    No complaints.      History of Present Illness: Sierra Carpenter is a 69 y.o. 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 She continues to be on a lot of social stress. Her husband passed away about 2 years ago. Her mother has progressive dementia and is now in a nursing home. She found a way to avoid putting her brother in a nursing home by enrolling him in the [facility].   She has a history of mild coronary artery disease by previous angiography (2000 , 50% ostial first diagonal, 30% proximal right coronary stenoses) her nuclear stress test was normal in 2012. Echocardiography in 2012 showed that her loud aortic ejection murmur was due to sclerosis without stenosis.   she denies exertional angina, exertional dyspnea , syncope, palpitations, lower extremity edema and intermittent claudication. She has not had focal neurological deficits.   she is compliant with statin and antihypertensive therapy.    Past Medical History  Diagnosis Date  . Hyperlipidemia   . Hypertension   . Coronary atherosclerosis     without significant obstruction  . Aortic valve sclerosis   . History of nuclear stress test 07/12/2010    bruce protocol myoview; normal pattern of perfusion, low risk, post-stress EF 92%    Past Surgical History  Procedure Laterality Date  . Appendectomy    . Abdominal hysterectomy    . Cholecystectomy    . Cardiac catheterization      moderate stenosis in 1st diagonal & RCA  . Transthoracic echocardiogram  07/12/2010    EF=>55% with normal systolic function;  trace MR/TR/PR     Current Outpatient Prescriptions  Medication Sig Dispense Refill  . Ascorbic Acid (VITAMIN C PO) Take by mouth daily.    Marland Kitchen aspirin 81 MG tablet Take 160 mg by mouth daily.    Marland Kitchen atorvastatin (LIPITOR) 40 MG tablet TAKE ONE TABLET BY MOUTH ONCE DAILY 30 tablet 12  . co-enzyme Q-10 30 MG capsule Take 100 mg by mouth 2 (two) times daily.    . fish oil-omega-3 fatty acids 1000 MG capsule Take 1 capsule by mouth 3 (three) times daily.    . Ipratropium-Albuterol (ALBUTEROL-IPRATROPIUM IN) Inhale into the lungs 4 (four) times daily as needed.    . loratadine (CLARITIN) 10 MG tablet Take 10 mg by mouth daily as needed for allergies.    Marland Kitchen losartan-hydrochlorothiazide (HYZAAR) 50-12.5 MG per tablet TAKE ONE TABLET BY MOUTH ONCE DAILY 90 tablet 0  . Multiple Vitamin (MULTIVITAMIN) tablet Take 1 tablet by mouth daily.     No current facility-administered medications for this visit.    Allergies:   Review of patient's allergies indicates no known allergies.    Social History:  The patient  reports that she has never smoked. She does not have any smokeless tobacco history on file. She reports that she does not drink alcohol or use illicit drugs.   Family History:  The patient's family history includes Alzheimer's disease in her mother;  Heart disease in her brother, father, paternal grandfather, and son.    ROS:  Please see the history of present illness.    Otherwise, review of systems positive for none.   All other systems are reviewed and negative.    PHYSICAL EXAM: VS:  BP 148/76 mmHg  Pulse 60  Ht 5\' 1"  (1.549 m)  Wt 170 lb (77.111 kg)  BMI 32.14 kg/m2 , BMI Body mass index is 32.14 kg/(m^2).  General: Alert, oriented x3, no distress,  But clearly in discomfort due to her back Head: no evidence of trauma, PERRL, EOMI, no exophtalmos or lid lag, no myxedema, no xanthelasma; normal ears, nose and oropharynx Neck: normal jugular venous pulsations and no hepatojugular  reflux; brisk carotid pulses without delay and no carotid bruits Chest: clear to auscultation, no signs of consolidation by percussion or palpation, normal fremitus, symmetrical and full respiratory excursions Cardiovascular: normal position and quality of the apical impulse, regular rhythm, normal first and second heart sounds,  2/6 early peaking systolic ejection murmur in the aortic focus, no diastolic murmurs, rubs or gallops Abdomen: no tenderness or distention, no masses by palpation, no abnormal pulsatility or arterial bruits, normal bowel sounds, no hepatosplenomegaly Extremities: no clubbing, cyanosis or edema; 2+ radial, ulnar and brachial pulses bilaterally; 2+ right femoral, posterior tibial and dorsalis pedis pulses; 2+ left femoral, posterior tibial and dorsalis pedis pulses; no subclavian or femoral bruits Neurological: grossly nonfocal Psych: euthymic mood, full affect   EKG:  EKG is ordered today. The ekg ordered today demonstrates  Normal sinus rhythm, poor R-wave progression unchanged from previous tracings. No repolarization abnormalities, QTC 396 ms   Recent Labs: 08/06/2014: ALT 15; BUN 22; Creat 1.06; Hemoglobin 15.5; Potassium 4.2; Sodium 141    Lipid Panel    Component Value Date/Time   CHOL 173 12/24/2013 0746   TRIG 176* 12/24/2013 0746   HDL 42 12/24/2013 0746   CHOLHDL 4.1 12/24/2013 0746   VLDL 35 12/24/2013 0746   LDLCALC 96 12/24/2013 0746      Wt Readings from Last 3 Encounters:  01/29/15 170 lb (77.111 kg)  12/18/14 166 lb 3.2 oz (75.388 kg)  08/08/14 167 lb (75.751 kg)     .   ASSESSMENT AND PLAN:  1.  Mild coronary artery disease, asymptomatic. The focus is on risk factor modification. 2.  Essential hypertension. Her systolic blood pressure is slightly high but she is clearly in some pain right now. No changes were made to her medications. 3.  Hyperlipidemia with fair lipid profile on current medical regimen year ago. Repeat lab tests.  Pointed out that she does have mild obesity and that weight loss is recommended , even if she cannot exercise. Try caloric restriction, especially avoiding carbohydrates and saturated fats and increasing intake of protein and unsaturated fat.    Current medicines are reviewed at length with the patient today.  The patient does not have concerns regarding medicines.  The following changes have been made:  no change  Labs/ tests ordered today include:  Orders Placed This Encounter  Procedures  . Comprehensive metabolic panel  . Lipid panel  . EKG 12-Lead    Patient Instructions  Your physician recommends that you return for lab work in: FASTING AT [facility]   Dr. Sallyanne Kuster recommends that you schedule a follow-up appointment in: ONE YEAR       SignedSanda Klein, MD  01/30/2015 5:20 PM    Sanda Klein, MD, [facility] [phone] office [phone] pager

## 2015-01-29 NOTE — Patient Instructions (Addendum)
Your physician recommends that you return for lab work in: FASTING AT [facility]   Dr. Sallyanne Kuster recommends that you schedule a follow-up appointment in: [place]

## 2015-01-30 ENCOUNTER — Encounter: Payer: Self-pay | Admitting: Cardiovascular Disease

## 2015-02-04 DIAGNOSIS — Z79899 Other long term (current) drug therapy: Secondary | ICD-10-CM | POA: Diagnosis not present

## 2015-02-04 DIAGNOSIS — E785 Hyperlipidemia, unspecified: Secondary | ICD-10-CM | POA: Diagnosis not present

## 2015-02-04 LAB — COMPREHENSIVE METABOLIC PANEL
ALT: 27 U/L (ref 6–29)
AST: 21 U/L (ref 10–35)
Albumin: 4.3 g/dL (ref 3.6–5.1)
Alkaline Phosphatase: 85 U/L (ref 33–130)
BUN: 21 mg/dL (ref 7–25)
CHLORIDE: 102 mmol/L (ref 98–110)
CO2: 29 mmol/L (ref 20–31)
CREATININE: 1 mg/dL — AB (ref 0.50–0.99)
Calcium: 9.2 mg/dL (ref 8.6–10.4)
GLUCOSE: 99 mg/dL (ref 65–99)
POTASSIUM: 4.1 mmol/L (ref 3.5–5.3)
SODIUM: 141 mmol/L (ref 135–146)
Total Bilirubin: 0.6 mg/dL (ref 0.2–1.2)
Total Protein: 6.6 g/dL (ref 6.1–8.1)

## 2015-02-04 LAB — LIPID PANEL
CHOL/HDL RATIO: 4.3 ratio (ref <=5.0)
CHOLESTEROL: 172 mg/dL (ref 125–200)
HDL: 40 mg/dL — ABNORMAL LOW (ref >=46)
LDL CALC: 85 mg/dL (ref <130)
Triglycerides: 234 mg/dL — ABNORMAL HIGH (ref <150)
VLDL: 47 mg/dL — AB (ref <30)

## 2015-03-30 ENCOUNTER — Ambulatory Visit (INDEPENDENT_AMBULATORY_CARE_PROVIDER_SITE_OTHER): Payer: Medicare Other | Admitting: Family Medicine

## 2015-03-30 VITALS — BP 118/66 | HR 80 | Temp 99.2°F | Resp 16 | Ht 61.0 in | Wt 173.4 lb

## 2015-03-30 DIAGNOSIS — J069 Acute upper respiratory infection, unspecified: Secondary | ICD-10-CM

## 2015-03-30 MED ORDER — HYDROCODONE-HOMATROPINE 5-1.5 MG/5ML PO SYRP: 5.0000 mL | ORAL_SOLUTION | Freq: Three times a day (TID) | ORAL | Status: DC | PRN

## 2015-03-30 MED ORDER — AZITHROMYCIN 250 MG PO TABS: ORAL_TABLET | ORAL | Status: DC

## 2015-03-30 NOTE — Progress Notes (Signed)
@[facility]CLOGO @  By signing my name below, I, Raven Small, attest that this documentation has been prepared under the direction and in the presence of [name], MD.  Electronically Signed: Thea Alken, ED Scribe. 03/30/2015. 4:31 PM.  Patient ID: Sierra Carpenter MRN: [id number], DOB: 1945/04/06, 69 y.o. Date of Encounter: 03/30/2015, 4:31 PM  Primary Physician: Jenny Reichmann, MD  Chief Complaint:  Chief Complaint  Patient presents with  . congestion  . Cough  . Headache  . ear fullness    left ear    HPI: 69 y.o. year old female with history below presents with cough. States symptoms started last night with sneezing. She woke up this morning with cough, congestion and HA. She tried taking Robitussin this morning without relief to symptoms. She's had sick contacts at church. No drug allergies.    Past Medical History  Diagnosis Date  . Hyperlipidemia   . Hypertension   . Coronary atherosclerosis     without significant obstruction  . Aortic valve sclerosis   . History of nuclear stress test 07/12/2010    bruce protocol myoview; normal pattern of perfusion, low risk, post-stress EF 92%     Home Meds: Prior to Admission medications   Medication Sig Start Date End Date Taking? Authorizing Provider  Ascorbic Acid (VITAMIN C PO) Take by mouth daily.   Yes Historical Provider, MD  aspirin 81 MG tablet Take 160 mg by mouth daily.   Yes Historical Provider, MD  atorvastatin (LIPITOR) 40 MG tablet TAKE ONE TABLET BY MOUTH ONCE DAILY 03/19/14  Yes Mihai Croitoru, MD  co-enzyme Q-10 30 MG capsule Take 100 mg by mouth 2 (two) times daily.   Yes Historical Provider, MD  fish oil-omega-3 fatty acids 1000 MG capsule Take 1 capsule by mouth 3 (three) times daily.   Yes Historical Provider, MD  losartan-hydrochlorothiazide (HYZAAR) 50-12.5 MG per tablet TAKE ONE TABLET BY MOUTH ONCE DAILY 12/30/14  Yes Mihai Croitoru, MD  Multiple Vitamin (MULTIVITAMIN) tablet Take 1 tablet by mouth daily.    Yes Historical Provider, MD  Ipratropium-Albuterol (ALBUTEROL-IPRATROPIUM IN) Inhale into the lungs 4 (four) times daily as needed. Reported on 03/30/2015    Historical Provider, MD  loratadine (CLARITIN) 10 MG tablet Take 10 mg by mouth daily as needed for allergies. Reported on 03/30/2015    Historical Provider, MD    Allergies: No Known Allergies  Social History   Social History  . Marital Status: Married    Spouse Name: N/A  . Number of Children: 1  . Years of Education: 12   Occupational History  . Not on file.   Social History Main Topics  . Smoking status: Never Smoker   . Smokeless tobacco: Not on file  . Alcohol Use: No  . Drug Use: No  . Sexual Activity: Yes   Other Topics Concern  . Not on file   Social History Narrative     Review of Systems: Constitutional: negative for chills, fever, night sweats, weight changes, or fatigue  HEENT: negative for vision changes, hearing loss,  rhinorrhea, ST, epistaxis, or sinus pressure. Positive congestion and sneezing Cardiovascular: negative for chest pain or palpitations Respiratory: negative for hemoptysis, wheezing, shortness of breath Positive cough Abdominal: negative for abdominal pain, nausea, vomiting, diarrhea, or constipation Dermatological: negative for rash Neurologic: negative for  dizziness, or syncope. Positive headache, All other systems reviewed and are otherwise negative with the exception to those above and in the HPI.   Physical Exam: Blood pressure 118/66,  pulse 80, temperature 99.2 F (37.3 C), temperature source Oral, resp. rate 16, height 5\' 1"  (1.549 m), weight 173 lb 6.4 oz (78.654 kg), SpO2 95 %., Body mass index is 32.78 kg/(m^2). General: Well developed, well nourished, in no acute distress. Head: Normocephalic, atraumatic, eyes without discharge, sclera non-icteric, nares are without discharge. Bilateral auditory canals clear, TM's are without perforation, pearly grey and translucent with  reflective cone of light bilaterally. Oral cavity moist, posterior pharynx without exudate, erythema, peritonsillar abscess, or post nasal drip.  Neck: Supple. No thyromegaly. Full ROM. No lymphadenopathy. Lungs: Rales at both bases,  Heart: RRR with S1 S2. No murmurs, rubs, or gallops appreciated. Abdomen: Soft, non-tender, non-distended with normoactive bowel sounds. No hepatomegaly. No rebound/guarding. No obvious abdominal masses. Msk:  Strength and tone normal for age. Extremities/Skin: Warm and dry. No clubbing or cyanosis. No edema. No rashes or suspicious lesions. Neuro: Alert and oriented X 3. Moves all extremities spontaneously. Gait is normal. CNII-XII grossly in tact. Psych:  Responds to questions appropriately with a normal affect.    ASSESSMENT AND PLAN:  69 y.o. year old female with     ICD-9-CM ICD-10-CM   1. Acute upper respiratory infection 465.9 J06.9 azithromycin (ZITHROMAX) 250 MG tablet     HYDROcodone-homatropine (HYCODAN) 5-1.5 MG/5ML syrup   Signed, [name], MD 03/30/2015 4:31 PM

## 2015-03-30 NOTE — Progress Notes (Signed)
[facility] [address], [place] [address] [phone]  Date:  03/30/2015   Name:  Sierra Carpenter   DOB:  [birth year]   MRN:  [id number]  PCP:  Jenny Reichmann, MD    Chief Complaint: congestion; Cough; Headache; and ear fullness   History of Present Illness:  Sierra Carpenter is a 69 y.o. very pleasant female patient who presents with the following: Cough and congestion for 24 hours. No fever or shortness of breath.  Established pt here today with illness  Patient Active Problem List   Diagnosis Date Noted  . Coronary atherosclerosis 01/01/2013  . Hypertension 05/09/2011  . Hyperlipidemia 05/09/2011  . Colon polyps 05/09/2011  . Healthcare maintenance 05/09/2011    Past Medical History  Diagnosis Date  . Hyperlipidemia   . Hypertension   . Coronary atherosclerosis     without significant obstruction  . Aortic valve sclerosis   . History of nuclear stress test 07/12/2010    bruce protocol myoview; normal pattern of perfusion, low risk, post-stress EF 92%    Past Surgical History  Procedure Laterality Date  . Appendectomy    . Abdominal hysterectomy    . Cholecystectomy    . Cardiac catheterization      moderate stenosis in 1st diagonal & RCA  . Transthoracic echocardiogram  07/12/2010    EF=>55% with normal systolic function; trace MR/TR/PR    Social History  Substance Use Topics  . Smoking status: Never Smoker   . Smokeless tobacco: None  . Alcohol Use: No    Family History  Problem Relation Age of Onset  . Heart disease Father   . Heart disease Brother     x3  . Heart disease Son   . Heart disease Paternal Grandfather   . Alzheimer's disease Mother     No Known Allergies  Medication list has been reviewed and updated.  Current Outpatient Prescriptions on File Prior to Visit  Medication Sig Dispense Refill  . Ascorbic Acid (VITAMIN C PO) Take by mouth daily.    Marland Kitchen aspirin 81 MG tablet Take 160 mg by mouth daily.    Marland Kitchen  atorvastatin (LIPITOR) 40 MG tablet TAKE ONE TABLET BY MOUTH ONCE DAILY 30 tablet 12  . co-enzyme Q-10 30 MG capsule Take 100 mg by mouth 2 (two) times daily.    . fish oil-omega-3 fatty acids 1000 MG capsule Take 1 capsule by mouth 3 (three) times daily.    Marland Kitchen losartan-hydrochlorothiazide (HYZAAR) 50-12.5 MG per tablet TAKE ONE TABLET BY MOUTH ONCE DAILY 90 tablet 0  . Multiple Vitamin (MULTIVITAMIN) tablet Take 1 tablet by mouth daily.    . Ipratropium-Albuterol (ALBUTEROL-IPRATROPIUM IN) Inhale into the lungs 4 (four) times daily as needed. Reported on 03/30/2015    . loratadine (CLARITIN) 10 MG tablet Take 10 mg by mouth daily as needed for allergies. Reported on 03/30/2015     No current facility-administered medications on file prior to visit.    Review of Systems:  No nausea or vomiting  Physical Examination: Filed Vitals:   03/30/15 1556  BP: 118/66  Pulse: 80  Temp: 99.2 F (37.3 C)  Resp: 16   Filed Vitals:   03/30/15 1556  Height: 5\' 1"  (1.549 m)  Weight: 173 lb 6.4 oz (78.654 kg)   Body mass index is 32.78 kg/(m^2). Ideal Body Weight: Weight in (lb) to have BMI = 25: 132 HEENT: PND without changes of TM's Chest:  Bibasilar rales  Assessment  and Plan: This chart was scribed in my presence and reviewed by me personally.    ICD-9-CM ICD-10-CM   1. Acute upper respiratory infection 465.9 J06.9 azithromycin (ZITHROMAX) 250 MG tablet     HYDROcodone-homatropine (HYCODAN) 5-1.5 MG/5ML syrup     Signed, [name], MD

## 2015-03-31 ENCOUNTER — Other Ambulatory Visit: Payer: Self-pay | Admitting: Cardiovascular Disease

## 2015-03-31 NOTE — Telephone Encounter (Signed)
Rx request sent to pharmacy.  

## 2015-04-03 ENCOUNTER — Telehealth: Payer: Self-pay

## 2015-04-03 NOTE — Telephone Encounter (Signed)
The Z pack she finished today is not working. She is still coughing,wheezing, and can feel it in her lungs. Energy level is also low. She wants to know if she needs to RTC or refill.  Please advise  [phone]

## 2015-04-03 NOTE — Telephone Encounter (Signed)
RTC

## 2015-04-04 ENCOUNTER — Ambulatory Visit (INDEPENDENT_AMBULATORY_CARE_PROVIDER_SITE_OTHER): Payer: Medicare Other | Admitting: Physician Assistant

## 2015-04-04 VITALS — BP 108/64 | HR 89 | Temp 98.0°F | Resp 16 | Ht 61.0 in | Wt 171.0 lb

## 2015-04-04 DIAGNOSIS — R05 Cough: Secondary | ICD-10-CM | POA: Diagnosis not present

## 2015-04-04 DIAGNOSIS — R062 Wheezing: Secondary | ICD-10-CM

## 2015-04-04 DIAGNOSIS — Z09 Encounter for follow-up examination after completed treatment for conditions other than malignant neoplasm: Secondary | ICD-10-CM

## 2015-04-04 DIAGNOSIS — R0600 Dyspnea, unspecified: Secondary | ICD-10-CM | POA: Diagnosis not present

## 2015-04-04 DIAGNOSIS — R059 Cough, unspecified: Secondary | ICD-10-CM

## 2015-04-04 LAB — POCT CBC
Granulocyte percent: 72.2 % (ref 37–80)
HCT, POC: 45.4 % (ref 37.7–47.9)
Hemoglobin: 16.1 g/dL (ref 12.2–16.2)
Lymph, poc: 2 (ref 0.6–3.4)
MCH, POC: 32.8 pg — AB (ref 27–31.2)
MCHC: 35.4 g/dL (ref 31.8–35.4)
MCV: 92.7 fL (ref 80–97)
MID (cbc): 0.6 (ref 0–0.9)
MPV: 7.2 fL (ref 0–99.8)
POC Granulocyte: 6.7 (ref 2–6.9)
POC LYMPH PERCENT: 21.1 % (ref 10–50)
POC MID %: 6.7 % (ref 0–12)
Platelet Count, POC: 282 K/uL (ref 142–424)
RBC: 4.9 M/uL (ref 4.04–5.48)
RDW, POC: 12.9 %
WBC: 9.3 K/uL (ref 4.6–10.2)

## 2015-04-04 MED ORDER — IPRATROPIUM BROMIDE 0.02 % IN SOLN
0.5000 mg | Freq: Once | RESPIRATORY_TRACT | Status: AC
  Administered 2015-04-04: 0.5 mg via RESPIRATORY_TRACT

## 2015-04-04 MED ORDER — BENZONATATE 100 MG PO CAPS
100.0000 mg | ORAL_CAPSULE | Freq: Three times a day (TID) | ORAL | Status: DC | PRN
Start: 2015-04-04 — End: 2015-05-05

## 2015-04-04 MED ORDER — ALBUTEROL SULFATE (2.5 MG/3ML) 0.083% IN NEBU
2.5000 mg | INHALATION_SOLUTION | Freq: Once | RESPIRATORY_TRACT | Status: AC
Start: 2015-04-04 — End: 2015-04-04
  Administered 2015-04-04: 2.5 mg via RESPIRATORY_TRACT

## 2015-04-04 MED ORDER — ALBUTEROL SULFATE HFA 108 (90 BASE) MCG/ACT IN AERS: 2.0000 | INHALATION_SPRAY | RESPIRATORY_TRACT | Status: DC | PRN

## 2015-04-04 MED ORDER — HYDROCOD POLST-CPM POLST ER 10-8 MG/5ML PO SUER: 5.0000 mL | Freq: Every evening | ORAL | Status: DC | PRN

## 2015-04-04 NOTE — Patient Instructions (Signed)
Please continue to drink plenty of water.  I would like you to use the tessalon pearls during the day. Take the tussionex at night.   Use the albuterol for the next 24 hours.  Then use it as a rescue inhaler.   Coughs can last up to 4-6 weeks.

## 2015-04-04 NOTE — Progress Notes (Signed)
[facility] [address] [place] [address] [phone]  Date:  04/04/2015   Name:  Sierra Carpenter   DOB:  06-14-1945   MRN:  [id number]  PCP:  Jenny Reichmann, MD   Chief Complaint  Patient presents with  . Follow-up    still having cough    History of Present Illness:  Sierra Carpenter is a 69 y.o. female patient who presents to [facility] for follow up of cough.    She was seen 5 days ago for cough, otalgia, and congestion Treated for upper respiratory with z-pak.  Last dose yesterday.  Patient states that she is doing better but still coughing, with intermittent productive cough.  Hacking cough until back hurts with her right side.  She has some shortness of breath with doing some of her normal exertional activities.  No fevers, sore throat, or ear fullness.  She is taking the hycodan which is helping very little.     Patient Active Problem List   Diagnosis Date Noted  . Coronary atherosclerosis 01/01/2013  . Hypertension 05/09/2011  . Hyperlipidemia 05/09/2011  . Colon polyps 05/09/2011  . Healthcare maintenance 05/09/2011    Past Medical History  Diagnosis Date  . Hyperlipidemia   . Hypertension   . Coronary atherosclerosis     without significant obstruction  . Aortic valve sclerosis   . History of nuclear stress test 07/12/2010    bruce protocol myoview; normal pattern of perfusion, low risk, post-stress EF 92%    Past Surgical History  Procedure Laterality Date  . Appendectomy    . Abdominal hysterectomy    . Cholecystectomy    . Cardiac catheterization      moderate stenosis in 1st diagonal & RCA  . Transthoracic echocardiogram  07/12/2010    EF=>55% with normal systolic function; trace MR/TR/PR    Social History  Substance Use Topics  . Smoking status: Never Smoker   . Smokeless tobacco: None  . Alcohol Use: No    Family History  Problem Relation Age of Onset  . Heart disease Father   . Heart disease Brother     x3  . Heart disease  Son   . Heart disease Paternal Grandfather   . Alzheimer's disease Mother     No Known Allergies  Medication list has been reviewed and updated.  Current Outpatient Prescriptions on File Prior to Visit  Medication Sig Dispense Refill  . Ascorbic Acid (VITAMIN C PO) Take by mouth daily.    Marland Kitchen aspirin 81 MG tablet Take 160 mg by mouth daily.    Marland Kitchen atorvastatin (LIPITOR) 40 MG tablet TAKE ONE TABLET BY MOUTH ONCE DAILY 30 tablet 12  . azithromycin (ZITHROMAX) 250 MG tablet Take 2 tabs PO x 1 dose, then 1 tab PO QD x 4 days 6 tablet 0  . co-enzyme Q-10 30 MG capsule Take 100 mg by mouth 2 (two) times daily.    . fish oil-omega-3 fatty acids 1000 MG capsule Take 1 capsule by mouth 3 (three) times daily.    Marland Kitchen HYDROcodone-homatropine (HYCODAN) 5-1.5 MG/5ML syrup Take 5 mLs by mouth every 8 (eight) hours as needed for cough. 120 mL 0  . losartan-hydrochlorothiazide (HYZAAR) 50-12.5 MG tablet TAKE ONE TABLET BY MOUTH ONCE DAILY 90 tablet 2  . Multiple Vitamin (MULTIVITAMIN) tablet Take 1 tablet by mouth daily.    . Ipratropium-Albuterol (ALBUTEROL-IPRATROPIUM IN) Inhale into the lungs 4 (four) times daily as needed. Reported on 04/04/2015  No current facility-administered medications on file prior to visit.    ROS ROS otherwise unremarkable unless listed above.  Physical Examination: BP 108/64 mmHg  Pulse 89  Temp(Src) 98 F (36.7 C)  Resp 16  Ht 5\' 1"  (1.549 m)  Wt 171 lb (77.565 kg)  BMI 32.33 kg/m2  SpO2 97% Ideal Body Weight: Weight in (lb) to have BMI = 25: 132  Physical Exam  Constitutional: She is oriented to person, place, and time. She appears well-developed and well-nourished. No distress.  HENT:  Head: Normocephalic and atraumatic.  Right Ear: Tympanic membrane, external ear and ear canal normal.  Left Ear: Tympanic membrane, external ear and ear canal normal.  Nose: No mucosal edema or rhinorrhea. Right sinus exhibits no maxillary sinus tenderness and no frontal  sinus tenderness. Left sinus exhibits no maxillary sinus tenderness and no frontal sinus tenderness.  Eyes: Conjunctivae and EOM are normal. Pupils are equal, round, and reactive to light.  Cardiovascular: Normal rate.   Pulmonary/Chest: Effort normal. No respiratory distress.  Neurological: She is alert and oriented to person, place, and time.  Skin: She is not diaphoretic.  Psychiatric: She has a normal mood and affect. Her behavior is normal.   Results for orders placed or performed in visit on 04/04/15  POCT CBC  Result Value Ref Range   WBC 9.3 4.6 - 10.2 K/uL   Lymph, poc 2.0 0.6 - 3.4   POC LYMPH PERCENT 21.1 10 - 50 %L   MID (cbc) 0.6 0 - 0.9   POC MID % 6.7 0 - 12 %M   POC Granulocyte 6.7 2 - 6.9   Granulocyte percent 72.2 37 - 80 %G   RBC 4.90 4.04 - 5.48 M/uL   Hemoglobin 16.1 12.2 - 16.2 g/dL   HCT, POC 45.4 37.7 - 47.9 %   MCV 92.7 80 - 97 fL   MCH, POC 32.8 (A) 27 - 31.2 pg   MCHC 35.4 31.8 - 35.4 g/dL   RDW, POC 12.9 %   Platelet Count, POC 282 142 - 424 K/uL   MPV 7.2 0 - 99.8 fL   Pre and post nebulizing treatment at 250.  Patient reports improvement with inhaler.    Assessment and Plan: Sierra Carpenter is a 69 y.o. female who is here today for follow up of cough.  --this appears like it is clearing up.  Azithromycin is still remaining in her system, and I have advised that she return in 1 week.  Given tussionex at this time.  Warned sedation, also given tessalon pearls.  Albuterol inhaler given, to do q4 hours next 24 hours.  Return sooner sxs given. Reassured that cough may be lingering for up to 4-8 weeks     Cough - Plan: POCT CBC, albuterol (PROVENTIL) (2.5 MG/3ML) 0.083% nebulizer solution 2.5 mg, ipratropium (ATROVENT) nebulizer solution 0.5 mg, albuterol (PROVENTIL HFA;VENTOLIN HFA) 108 (90 Base) MCG/ACT inhaler, benzonatate (TESSALON) 100 MG capsule, chlorpheniramine-HYDROcodone (TUSSIONEX PENNKINETIC ER) 10-8 MG/5ML SUER  Dyspnea - Plan: POCT CBC,  albuterol (PROVENTIL) (2.5 MG/3ML) 0.083% nebulizer solution 2.5 mg, ipratropium (ATROVENT) nebulizer solution 0.5 mg, albuterol (PROVENTIL HFA;VENTOLIN HFA) 108 (90 Base) MCG/ACT inhaler  Wheezing - Plan: benzonatate (TESSALON) 100 MG capsule, chlorpheniramine-HYDROcodone (TUSSIONEX PENNKINETIC ER) 10-8 MG/5ML SUER  Follow-up exam after treatment   Ivar Drape, PA-C [facility] 04/04/2015 9:06 AM

## 2015-04-13 ENCOUNTER — Other Ambulatory Visit: Payer: Self-pay | Admitting: Cardiovascular Disease

## 2015-05-05 ENCOUNTER — Encounter: Payer: Self-pay | Admitting: Emergency Medicine

## 2015-05-05 ENCOUNTER — Ambulatory Visit (INDEPENDENT_AMBULATORY_CARE_PROVIDER_SITE_OTHER): Payer: Medicare Other | Admitting: Emergency Medicine

## 2015-05-05 VITALS — BP 136/72 | HR 70 | Temp 97.8°F | Resp 16 | Ht 61.5 in | Wt 170.0 lb

## 2015-05-05 DIAGNOSIS — Z0000 Encounter for general adult medical examination without abnormal findings: Secondary | ICD-10-CM | POA: Diagnosis not present

## 2015-05-05 DIAGNOSIS — E785 Hyperlipidemia, unspecified: Secondary | ICD-10-CM | POA: Diagnosis not present

## 2015-05-05 DIAGNOSIS — R413 Other amnesia: Secondary | ICD-10-CM | POA: Diagnosis not present

## 2015-05-05 DIAGNOSIS — R011 Cardiac murmur, unspecified: Secondary | ICD-10-CM | POA: Diagnosis not present

## 2015-05-05 DIAGNOSIS — Z0100 Encounter for examination of eyes and vision without abnormal findings: Secondary | ICD-10-CM | POA: Diagnosis not present

## 2015-05-05 DIAGNOSIS — I10 Essential (primary) hypertension: Secondary | ICD-10-CM | POA: Diagnosis not present

## 2015-05-05 DIAGNOSIS — H00016 Hordeolum externum left eye, unspecified eyelid: Secondary | ICD-10-CM

## 2015-05-05 DIAGNOSIS — N390 Urinary tract infection, site not specified: Secondary | ICD-10-CM | POA: Diagnosis not present

## 2015-05-05 DIAGNOSIS — R8281 Pyuria: Secondary | ICD-10-CM

## 2015-05-05 DIAGNOSIS — L570 Actinic keratosis: Secondary | ICD-10-CM

## 2015-05-05 DIAGNOSIS — R8299 Other abnormal findings in urine: Secondary | ICD-10-CM | POA: Diagnosis not present

## 2015-05-05 LAB — COMPLETE METABOLIC PANEL WITH GFR
ALBUMIN: 4.3 g/dL (ref 3.6–5.1)
ALK PHOS: 81 U/L (ref 33–130)
ALT: 21 U/L (ref 6–29)
AST: 18 U/L (ref 10–35)
BILIRUBIN TOTAL: 0.5 mg/dL (ref 0.2–1.2)
BUN: 17 mg/dL (ref 7–25)
CALCIUM: 9.8 mg/dL (ref 8.6–10.4)
CO2: 28 mmol/L (ref 20–31)
CREATININE: 1.03 mg/dL — AB (ref 0.50–0.99)
Chloride: 102 mmol/L (ref 98–110)
GFR, Est African American: 65 mL/min (ref >=60)
GFR, Est Non African American: 56 mL/min — ABNORMAL LOW (ref >=60)
GLUCOSE: 103 mg/dL — AB (ref 65–99)
Potassium: 4.8 mmol/L (ref 3.5–5.3)
SODIUM: 142 mmol/L (ref 135–146)
TOTAL PROTEIN: 6.4 g/dL (ref 6.1–8.1)

## 2015-05-05 LAB — POCT URINALYSIS DIP (MANUAL ENTRY)
BILIRUBIN UA: NEGATIVE
Bilirubin, UA: NEGATIVE
Blood, UA: NEGATIVE
GLUCOSE UA: NEGATIVE
Nitrite, UA: NEGATIVE
PROTEIN UA: NEGATIVE
SPEC GRAV UA: 1.02
Urobilinogen, UA: 0.2
pH, UA: 6

## 2015-05-05 LAB — CBC WITH DIFFERENTIAL/PLATELET
BASOS PCT: 1 % (ref 0–1)
Basophils Absolute: 0.1 10*3/uL (ref 0.0–0.1)
Eosinophils Absolute: 0.7 10*3/uL (ref 0.0–0.7)
Eosinophils Relative: 8 % — ABNORMAL HIGH (ref 0–5)
HEMATOCRIT: 47.3 % — AB (ref 36.0–46.0)
HEMOGLOBIN: 16.3 g/dL — AB (ref 12.0–15.0)
LYMPHS PCT: 24 % (ref 12–46)
Lymphs Abs: 2 10*3/uL (ref 0.7–4.0)
MCH: 31.7 pg (ref 26.0–34.0)
MCHC: 34.5 g/dL (ref 30.0–36.0)
MCV: 92 fL (ref 78.0–100.0)
MONO ABS: 0.5 10*3/uL (ref 0.1–1.0)
MONOS PCT: 6 % (ref 3–12)
MPV: 9.7 fL (ref 8.6–12.4)
NEUTROS ABS: 5.1 10*3/uL (ref 1.7–7.7)
NEUTROS PCT: 61 % (ref 43–77)
Platelets: 302 10*3/uL (ref 150–400)
RBC: 5.14 MIL/uL — AB (ref 3.87–5.11)
RDW: 13.4 % (ref 11.5–15.5)
WBC: 8.3 10*3/uL (ref 4.0–10.5)

## 2015-05-05 LAB — TSH: TSH: 2.462 u[IU]/mL (ref 0.350–4.500)

## 2015-05-05 LAB — LIPID PANEL
CHOL/HDL RATIO: 3.5 ratio (ref <=5.0)
Cholesterol: 159 mg/dL (ref 125–200)
HDL: 46 mg/dL (ref >=46)
LDL CALC: 65 mg/dL (ref <130)
Triglycerides: 242 mg/dL — ABNORMAL HIGH (ref <150)
VLDL: 48 mg/dL — AB (ref <30)

## 2015-05-05 MED ORDER — ERYTHROMYCIN 5 MG/GM OP OINT
1.0000 | TOPICAL_OINTMENT | Freq: Every day | OPHTHALMIC | Status: DC
Start: 2015-05-05 — End: 2016-02-02

## 2015-05-05 NOTE — Progress Notes (Addendum)
Patient ID: Sierra Carpenter, female   DOB: 1946/03/17, 70 y.o.   MRN: [id number]     By signing my name below, I, Sierra Carpenter, attest that this documentation has been prepared under the direction and in the presence of Arlyss Queen, MD.  Electronically Signed: Zola Carpenter, Medical Scribe. 05/05/2015. 8:27 AM.   Chief Complaint:  Chief Complaint  Patient presents with  . Annual Exam    HPI: Sierra Carpenter is a 70 y.o. female who reports to [facility] today for an annual exam. Patient sees Dr. Sallyanne Kuster. She is s/p hysterectomy but still has her ovaries. Patient is due for colonoscopy in March; her last one was done by Dr. Oletta Lamas. She is UTD on mammogram. 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 Katy Fitch. She has a FMHx of Alzheimer's in her mother's side of the family. Patient received a shingles vaccine about 2-3 years ago.  Patient reports having an area of scaly skin on her right hand and on her face. Her dermatologist is at [facility].  Patient would like to establish with Tor Netters, NP.  She has been under some stress lately as she has to put her brother in a nursing facility.  Past Medical History  Diagnosis Date  . Hyperlipidemia   . Hypertension   . Coronary atherosclerosis     without significant obstruction  . Aortic valve sclerosis   . History of nuclear stress test 07/12/2010    bruce protocol myoview; normal pattern of perfusion, low risk, post-stress EF 92%   Past Surgical History  Procedure Laterality Date  . Appendectomy    . Abdominal hysterectomy    . Cholecystectomy    . Cardiac catheterization      moderate stenosis in 1st diagonal & RCA  . Transthoracic echocardiogram  07/12/2010    EF=>55% with normal systolic function; trace MR/TR/PR   Social History   Social History  . Marital Status: Married    Spouse Name: N/A  . Number of Children: 1  . Years  of Education: 33   Social History Main Topics  . Smoking status: Never Smoker   . Smokeless tobacco: None  . Alcohol Use: No  . Drug Use: No  . Sexual Activity: Yes   Other Topics Concern  . None   Social History Narrative   Family History  Problem Relation Age of Onset  . Heart disease Father   . Heart disease Brother     x3  . Heart disease Son   . Heart disease Paternal Grandfather   . Alzheimer's disease Mother    No Known Allergies Prior to Admission medications   Medication Sig Start Date End Date Taking? Authorizing Provider  albuterol (PROVENTIL HFA;VENTOLIN HFA) 108 (90 Base) MCG/ACT inhaler Inhale 2 puffs into the lungs every 4 (four) hours as needed for wheezing or shortness of breath (cough, shortness of breath or wheezing.). 04/04/15  Yes Stephanie D English, PA  Ascorbic Acid (VITAMIN C PO) Take by mouth daily.   Yes Historical Provider, MD  aspirin 81 MG tablet Take 160 mg by mouth daily.   Yes Historical Provider, MD  atorvastatin (LIPITOR) 40 MG tablet TAKE ONE TABLET BY MOUTH ONCE DAILY 04/13/15  Yes Mihai Croitoru, MD  co-enzyme Q-10 30 MG capsule Take 100 mg by mouth 2 (two) times daily.   Yes Historical Provider, MD  fish  oil-omega-3 fatty acids 1000 MG capsule Take 1 capsule by mouth 3 (three) times daily.   Yes Historical Provider, MD  Ipratropium-Albuterol (ALBUTEROL-IPRATROPIUM IN) Inhale into the lungs 4 (four) times daily as needed. Reported on 04/04/2015   Yes Historical Provider, MD  losartan-hydrochlorothiazide (HYZAAR) 50-12.5 MG tablet TAKE ONE TABLET BY MOUTH ONCE DAILY 03/31/15  Yes Mihai Croitoru, MD  Multiple Vitamin (MULTIVITAMIN) tablet Take 1 tablet by mouth daily.   Yes Historical Provider, MD     ROS: The patient denies fevers, chills, night sweats, unintentional weight loss, chest pain, palpitations, wheezing, dyspnea on exertion, nausea, vomiting, abdominal pain, dysuria, hematuria, melena, numbness, weakness, or tingling.   All other  systems have been reviewed and were otherwise negative with the exception of those mentioned in the HPI and as above.    PHYSICAL EXAM: Filed Vitals:   05/05/15 0810  BP: 156/74  Pulse: 70  Temp: 97.8 F (36.6 C)  Resp: 16   Body mass index is 31.6 kg/(m^2).   General: Alert, no acute distress HEENT:  Normocephalic, atraumatic, oropharynx patent. Eye: [facility], [facility]. Left upper lid there is a small stye. Cardiovascular:  Grade 2 systolic murmur at the upper left sternal border. Pulses 2+. Respiratory: Clear to auscultation bilaterally.  No wheezes, rales, or rhonchi.  No cyanosis, no use of accessory musculature Abdominal: No masses. Musculoskeletal: Gait intact. No edema, tenderness Skin: 1 cm scaly area, left cheek. 0.5 cm scaly, red area, dorsum right hand. Neurologic: Facial musculature symmetric. Psychiatric: Patient acts appropriately throughout our interaction. Lymphatic: No cervical or submandibular lymphadenopathy Genitourinary: Breast no masses.   LABS: Results for orders placed or performed in visit on 05/05/15  POCT urinalysis dipstick  Result Value Ref Range   Color, UA yellow yellow   Clarity, UA clear clear   Glucose, UA negative negative   Bilirubin, UA negative negative   Ketones, POC UA negative negative   Spec Grav, UA 1.020    Blood, UA negative negative   pH, UA 6.0    Protein Ur, POC negative negative   Urobilinogen, UA 0.2    Nitrite, UA Negative Negative   Leukocytes, UA small (1+) (A) Negative     EKG/XRAY:   Primary read interpreted by Dr. Everlene Farrier at [facility].   ASSESSMENT/PLAN: Patient is doing well. She looks good. Referral made to Dr. Katy Fitch for eye check. Referral made to Dr. Allyson Sabal for evaluation of actinic keratosis. She has been released by her GYN doctor. She is up-to-date on her mammograms. She also is due this year for colonoscopy and will have that with Dr. Oletta Lamas. I did give her erythromycin for a stye on her left eye. Overall she is  doing well except for dealing with a lot of stress with her handicapped brother who is going to need placement.I personally performed the services described in this documentation, which was scribed in my presence. The recorded information has been reviewed and is accurate. Urine culture was done.    Gross sideeffects, risk and benefits, and alternatives of medications d/w patient. Patient is aware that all medications have potential sideeffects and we are unable to predict every sideeffect or drug-drug interaction that may occur.  Arlyss Queen MD 05/05/2015 8:27 AM

## 2015-05-06 LAB — URINE CULTURE
COLONY COUNT: NO GROWTH
ORGANISM ID, BACTERIA: NO GROWTH

## 2015-05-08 DIAGNOSIS — L812 Freckles: Secondary | ICD-10-CM | POA: Diagnosis not present

## 2015-05-08 DIAGNOSIS — L820 Inflamed seborrheic keratosis: Secondary | ICD-10-CM | POA: Diagnosis not present

## 2015-05-08 DIAGNOSIS — D225 Melanocytic nevi of trunk: Secondary | ICD-10-CM | POA: Diagnosis not present

## 2015-05-08 DIAGNOSIS — D1801 Hemangioma of skin and subcutaneous tissue: Secondary | ICD-10-CM | POA: Diagnosis not present

## 2015-05-08 DIAGNOSIS — L570 Actinic keratosis: Secondary | ICD-10-CM | POA: Diagnosis not present

## 2015-05-08 DIAGNOSIS — L821 Other seborrheic keratosis: Secondary | ICD-10-CM | POA: Diagnosis not present

## 2015-05-14 DIAGNOSIS — H2513 Age-related nuclear cataract, bilateral: Secondary | ICD-10-CM | POA: Diagnosis not present

## 2015-05-14 DIAGNOSIS — H353131 Nonexudative age-related macular degeneration, bilateral, early dry stage: Secondary | ICD-10-CM | POA: Diagnosis not present

## 2015-05-14 DIAGNOSIS — H04123 Dry eye syndrome of bilateral lacrimal glands: Secondary | ICD-10-CM | POA: Diagnosis not present

## 2015-05-20 ENCOUNTER — Other Ambulatory Visit: Payer: Self-pay | Admitting: *Deleted

## 2015-05-20 DIAGNOSIS — D582 Other hemoglobinopathies: Secondary | ICD-10-CM

## 2015-05-27 ENCOUNTER — Telehealth: Payer: Self-pay | Admitting: Hematology

## 2015-05-27 NOTE — Telephone Encounter (Signed)
Pt called to inquire about her referral due to her requesting to come the [facility] for a hem appt.  I was able to schedule appt and pt comfirmed.

## 2015-06-01 ENCOUNTER — Ambulatory Visit (HOSPITAL_BASED_OUTPATIENT_CLINIC_OR_DEPARTMENT_OTHER): Payer: Medicare Other

## 2015-06-01 ENCOUNTER — Telehealth: Payer: Self-pay | Admitting: Hematology

## 2015-06-01 ENCOUNTER — Encounter: Payer: Self-pay | Admitting: Hematology

## 2015-06-01 ENCOUNTER — Ambulatory Visit (HOSPITAL_BASED_OUTPATIENT_CLINIC_OR_DEPARTMENT_OTHER): Payer: Medicare Other | Admitting: Hematology

## 2015-06-01 VITALS — BP 147/78 | HR 84 | Temp 97.9°F | Resp 18 | Ht 60.75 in | Wt 171.7 lb

## 2015-06-01 DIAGNOSIS — D751 Secondary polycythemia: Secondary | ICD-10-CM

## 2015-06-01 DIAGNOSIS — D45 Polycythemia vera: Secondary | ICD-10-CM | POA: Diagnosis not present

## 2015-06-01 LAB — COMPREHENSIVE METABOLIC PANEL
ALBUMIN: 4 g/dL (ref 3.5–5.0)
ALK PHOS: 97 U/L (ref 40–150)
ALT: 28 U/L (ref 0–55)
AST: 22 U/L (ref 5–34)
Anion Gap: 12 mEq/L — ABNORMAL HIGH (ref 3–11)
BUN: 21.3 mg/dL (ref 7.0–26.0)
CALCIUM: 10.6 mg/dL — AB (ref 8.4–10.4)
CHLORIDE: 104 meq/L (ref 98–109)
CO2: 26 mEq/L (ref 22–29)
Creatinine: 1.1 mg/dL (ref 0.6–1.1)
EGFR: 55 mL/min/{1.73_m2} — ABNORMAL LOW (ref >90)
Glucose: 86 mg/dl (ref 70–140)
Potassium: 4.6 mEq/L (ref 3.5–5.1)
Sodium: 142 mEq/L (ref 136–145)
TOTAL PROTEIN: 7.2 g/dL (ref 6.4–8.3)
Total Bilirubin: 0.45 mg/dL (ref 0.20–1.20)

## 2015-06-01 LAB — CBC & DIFF AND RETIC
BASO%: 0.4 % (ref 0.0–2.0)
BASOS ABS: 0.1 10*3/uL (ref 0.0–0.1)
EOS ABS: 0.9 10*3/uL — AB (ref 0.0–0.5)
EOS%: 7.7 % — ABNORMAL HIGH (ref 0.0–7.0)
HEMATOCRIT: 46 % (ref 34.8–46.6)
HEMOGLOBIN: 16.4 g/dL — AB (ref 11.6–15.9)
IMMATURE RETIC FRACT: 4.7 % (ref 1.60–10.00)
LYMPH#: 2.3 10*3/uL (ref 0.9–3.3)
LYMPH%: 20.9 % (ref 14.0–49.7)
MCH: 32.8 pg (ref 25.1–34.0)
MCHC: 35.7 g/dL (ref 31.5–36.0)
MCV: 92 fL (ref 79.5–101.0)
MONO#: 0.7 10*3/uL (ref 0.1–0.9)
MONO%: 6.2 % (ref 0.0–14.0)
NEUT#: 7.3 10*3/uL — ABNORMAL HIGH (ref 1.5–6.5)
NEUT%: 64.8 % (ref 38.4–76.8)
Platelets: 292 10*3/uL (ref 145–400)
RBC: 5 10*6/uL (ref 3.70–5.45)
RDW: 13.1 % (ref 11.2–14.5)
RETIC %: 1.58 % (ref 0.70–2.10)
RETIC CT ABS: 79 10*3/uL (ref 33.70–90.70)
WBC: 11.2 10*3/uL — ABNORMAL HIGH (ref 3.9–10.3)

## 2015-06-01 NOTE — Telephone Encounter (Signed)
Schedule lab per pof, avs report printed.

## 2015-06-01 NOTE — Progress Notes (Signed)
Sierra Carpenter Kitchen    HEMATOLOGY/ONCOLOGY CONSULTATION NOTE  Date of Service: 06/01/2015  Patient Care Team: Darlyne Russian, MD as PCP - General (Family Medicine)  CHIEF COMPLAINTS/PURPOSE OF CONSULTATION:  Polycythemia  HISTORY OF PRESENTING ILLNESS:  Sierra Carpenter is a wonderful 70 y.o. female who has been referred to [place] by Dr Everlene Farrier for evaluation and management of polycythemia.  Patient has a history of hypertension, dyslipidemia, coronary artery disease who had recent routine labs with her primary care physician about a month ago and was noted to have an elevated hemoglobin of 16.3 with a hematocrit of 47.3.  WBC count was normal at 8.3k and platelets were within normal limits at 302k. Patient was referred to [place] for further evaluation for polycythemia.  Patient denies any visual blurring, headaches, fever, chills, night sweats, shortness of breath, abdominal pain.  Patient has no other acute focal symptoms at this time.  No previous history of blood clots. Denies any sleeping problem. He has been drinking enough water. She is on a diuretic for hypertension.  Labs in clinic today show a hemoglobin of 16.4 with hematocrit of 46.  WBC count of 11.2k normal platelets of 292k   MEDICAL HISTORY:  Past Medical History  Diagnosis Date  . Hyperlipidemia   . Hypertension   . Coronary atherosclerosis     without significant obstruction  . Aortic valve sclerosis   . History of nuclear stress test 07/12/2010    bruce protocol myoview; normal pattern of perfusion, low risk, post-stress EF 92%    SURGICAL HISTORY: Past Surgical History  Procedure Laterality Date  . Appendectomy    . Abdominal hysterectomy    . Cholecystectomy    . Cardiac catheterization      moderate stenosis in 1st diagonal & RCA  . Transthoracic echocardiogram  07/12/2010    EF=>55% with normal systolic function; trace MR/TR/PR    SOCIAL HISTORY: Social History   Social History  . Marital Status: Married    Spouse Name:  N/A  . Number of Children: 1  . Years of Education: 12   Occupational History  . Not on file.   Social History Main Topics  . Smoking status: Never Smoker   . Smokeless tobacco: Not on file  . Alcohol Use: No  . Drug Use: No  . Sexual Activity: Yes   Other Topics Concern  . Not on file   Social History Narrative    FAMILY HISTORY: Family History  Problem Relation Age of Onset  . Heart disease Father   . Heart disease Brother     x3  . Heart disease Son   . Heart disease Paternal Grandfather   . Alzheimer's disease Mother     ALLERGIES:  has No Known Allergies.  MEDICATIONS:  Current Outpatient Prescriptions  Medication Sig Dispense Refill  . albuterol (PROVENTIL HFA;VENTOLIN HFA) 108 (90 Base) MCG/ACT inhaler Inhale 2 puffs into the lungs every 4 (four) hours as needed for wheezing or shortness of breath (cough, shortness of breath or wheezing.). 1 Inhaler 1  . Ascorbic Acid (VITAMIN C PO) Take by mouth daily.    Sierra Carpenter Kitchen aspirin 81 MG tablet Take 160 mg by mouth daily.    Sierra Carpenter Kitchen atorvastatin (LIPITOR) 40 MG tablet TAKE ONE TABLET BY MOUTH ONCE DAILY 30 tablet 8  . co-enzyme Q-10 30 MG capsule Take 100 mg by mouth 2 (two) times daily.    Sierra Carpenter Kitchen erythromycin ophthalmic ointment Place 1 application into the left eye at bedtime. 3.5 g 0  .  fish oil-omega-3 fatty acids 1000 MG capsule Take 1 capsule by mouth 3 (three) times daily.    . Ipratropium-Albuterol (ALBUTEROL-IPRATROPIUM IN) Inhale into the lungs 4 (four) times daily as needed. Reported on 04/04/2015    . losartan-hydrochlorothiazide (HYZAAR) 50-12.5 MG tablet TAKE ONE TABLET BY MOUTH ONCE DAILY 90 tablet 2  . Multiple Vitamin (MULTIVITAMIN) tablet Take 1 tablet by mouth daily.     No current facility-administered medications for this visit.    REVIEW OF SYSTEMS:    10 Point review of Systems was done is negative except as noted above.  PHYSICAL EXAMINATION: ECOG PERFORMANCE STATUS: 1 - Symptomatic but completely  ambulatory  . Filed Vitals:   06/01/15 1359  BP: 147/78  Pulse: 84  Temp: 97.9 F (36.6 C)  Resp: 18   Filed Weights   06/01/15 1359  Weight: 171 lb 11.2 oz (77.883 kg)   .Body mass index is 32.71 kg/(m^2).  GENERAL:alert, in no acute distress and comfortable SKIN: skin color, texture, turgor are normal, no rashes or significant lesions EYES: normal, conjunctiva are pink and non-injected, sclera clear OROPHARYNX:no exudate, no erythema and lips, buccal mucosa, and tongue normal  NECK: supple, no JVD, thyroid normal size, non-tender, without nodularity LYMPH:  no palpable lymphadenopathy in the cervical, axillary or inguinal LUNGS: clear to auscultation with normal respiratory effort HEART: regular rate & rhythm,  no murmurs and no lower extremity edema ABDOMEN: abdomen soft, non-tender, normoactive bowel sounds  Musculoskeletal: no cyanosis of digits and no clubbing  PSYCH: alert & oriented x 3 with fluent speech NEURO: no focal motor/sensory deficits  LABORATORY DATA:  I have reviewed the data as listed  . CBC Latest Ref Rng 06/01/2015 05/05/2015 04/04/2015  WBC 3.9 - 10.3 10e3/uL 11.2(H) 8.3 9.3  Hemoglobin 11.6 - 15.9 g/dL 16.4(H) 16.3(H) 16.1  Hematocrit 34.8 - 46.6 % 46.0 47.3(H) 45.4  Platelets 145 - 400 10e3/uL 292 302 -   . CBC    Component Value Date/Time   WBC 11.2* 06/01/2015 1504   WBC 8.3 05/05/2015 0853   WBC 9.3 04/04/2015 0927   RBC 5.00 06/01/2015 1504   RBC 5.14* 05/05/2015 0853   RBC 4.90 04/04/2015 0927   HGB 16.4* 06/01/2015 1504   HGB 16.3* 05/05/2015 0853   HGB 16.1 04/04/2015 0927   HCT 46.0 06/01/2015 1504   HCT 47.3* 05/05/2015 0853   HCT 45.4 04/04/2015 0927   PLT 292 06/01/2015 1504   PLT 302 05/05/2015 0853   MCV 92.0 06/01/2015 1504   MCV 92.0 05/05/2015 0853   MCV 92.7 04/04/2015 0927   MCH 32.8 06/01/2015 1504   MCH 31.7 05/05/2015 0853   MCH 32.8* 04/04/2015 0927   MCHC 35.7 06/01/2015 1504   MCHC 34.5 05/05/2015 0853    MCHC 35.4 04/04/2015 0927   RDW 13.1 06/01/2015 1504   RDW 13.4 05/05/2015 0853   LYMPHSABS 2.3 06/01/2015 1504   LYMPHSABS 2.0 05/05/2015 0853   MONOABS 0.7 06/01/2015 1504   MONOABS 0.5 05/05/2015 0853   EOSABS 0.9* 06/01/2015 1504   EOSABS 0.7 05/05/2015 0853   BASOSABS 0.1 06/01/2015 1504   BASOSABS 0.1 05/05/2015 0853     . CMP Latest Ref Rng 06/01/2015 05/05/2015 02/04/2015  Glucose 70 - 140 mg/dl 86 103(H) 99  BUN 7.0 - 26.0 mg/dL 21.3 17 21   Creatinine 0.6 - 1.1 mg/dL 1.1 1.03(H) 1.00(H)  Sodium 136 - 145 mEq/L 142 142 141  Potassium 3.5 - 5.1 mEq/L 4.6 4.8 4.1  Chloride 98 -  110 mmol/L - 102 102  CO2 22 - 29 mEq/L 26 28 29   Calcium 8.4 - 10.4 mg/dL 10.6(H) 9.8 9.2  Total Protein 6.4 - 8.3 g/dL 7.2 6.4 6.6  Total Bilirubin 0.20 - 1.20 mg/dL 0.45 0.5 0.6  Alkaline Phos 40 - 150 U/L 97 81 85  AST 5 - 34 U/L 22 18 21   ALT 0 - 55 U/L 28 21 27      RADIOGRAPHIC STUDIES: I have personally reviewed the radiological images as listed and agreed with the findings in the report. No results found.  ASSESSMENT & PLAN:   70 year old female with #1 borderline polycythemia  Rule out clonal erythropoiesis. Likely secondary - hemoconcentration from HCTZ or increased CO levels. Obesity  Plan -will get a Jak2 V617F mutation (negative) and JAk2 exon 12 mutation(pending) -Counseled the patient to maintain good hydration -continue ASA daily as a blood thinner. -no strong indication for therapeutic phlebotomy at this time. -consider sleep study - discuss PCP  #2 Hypercalcemia - likely from volume contraction and HCTZ. -rpt labs WNL.  RTC with Dr Irene Limbo based on testing results. If mutation studies negative would have patient focus on symptom continue f/u with PCP     . Orders Placed This Encounter  Procedures  . CBC & Diff and Retic    Standing Status: Future     Number of Occurrences: 1     Standing Expiration Date: 07/05/2016  . Comprehensive metabolic panel    Standing  Status: Future     Number of Occurrences: 1     Standing Expiration Date: 05/31/2016  . JAK2 genotypr    Standing Status: Future     Number of Occurrences: 1     Standing Expiration Date: 05/31/2016  . JAK-2 Exon 12 (only if JAK2-V617F neg)    Standing Status: Future     Number of Occurrences: 1     Standing Expiration Date: 05/31/2016  . Carbon monoxide, blood (performed at ref lab)    Standing Status: Future     Number of Occurrences: 1     Standing Expiration Date: 05/31/2016     All of the patients questions were answered with apparent satisfaction. The patient knows to call the clinic with any problems, questions or concerns.  I spent 45 minutes counseling the patient face to face. The total time spent in the appointment was 55 minutes and more than 50% was on counseling and direct patient cares.    Sullivan Lone MD [place] [facility] Hematology/Oncology Physician [facility]  (Office):       [phone] (Work cell):  [phone] (Fax):           [phone]  06/01/2015 2:02 PM

## 2015-06-02 LAB — CARBON MONOXIDE, BLOOD (PERFORMED AT REF LAB): Carbon Monoxide, Blood: 2.6 % — ABNORMAL HIGH (ref 0.0–1.9)

## 2015-06-16 ENCOUNTER — Other Ambulatory Visit: Payer: Self-pay | Admitting: Hematology

## 2015-06-16 ENCOUNTER — Telehealth: Payer: Self-pay | Admitting: *Deleted

## 2015-06-16 NOTE — Telephone Encounter (Signed)
Received phone call from pt requesting lab results from previous visit.  Reviewed results with Dr. Irene Limbo.  Per Dr. Irene Limbo, JAK 2 study is negative, ruling out 95% chance of bone marrow disease.  Other study pending.  No further action necessary at this time.  Per Dr. Irene Limbo, pt to continue staying very hydrated and get sleep study set up through PCP.  Notified pt we would notify her if pending lab results are concerning.  Pt verbalized understanding.

## 2015-06-23 ENCOUNTER — Ambulatory Visit (INDEPENDENT_AMBULATORY_CARE_PROVIDER_SITE_OTHER): Payer: Medicare Other | Admitting: Family Medicine

## 2015-06-23 VITALS — BP 133/81 | HR 80 | Temp 98.8°F | Resp 16 | Ht 62.0 in | Wt 174.2 lb

## 2015-06-23 DIAGNOSIS — Z6831 Body mass index (BMI) 31.0-31.9, adult: Secondary | ICD-10-CM | POA: Diagnosis not present

## 2015-06-23 DIAGNOSIS — R7981 Abnormal blood-gas level: Secondary | ICD-10-CM | POA: Diagnosis not present

## 2015-06-23 DIAGNOSIS — L02419 Cutaneous abscess of limb, unspecified: Secondary | ICD-10-CM | POA: Diagnosis not present

## 2015-06-23 DIAGNOSIS — I10 Essential (primary) hypertension: Secondary | ICD-10-CM | POA: Diagnosis not present

## 2015-06-23 MED ORDER — CEPHALEXIN 500 MG PO CAPS: 500.0000 mg | ORAL_CAPSULE | Freq: Three times a day (TID) | ORAL | Status: DC

## 2015-06-23 NOTE — Patient Instructions (Signed)
Apply warm compresses several times a day Finish all antibiotic Come back in if not resolved with treatment or if you get worse  Abscess An abscess is an infected area that contains a collection of pus and debris.It can occur in almost any part of the body. An abscess is also known as a furuncle or boil. CAUSES  An abscess occurs when tissue gets infected. This can occur from blockage of oil or sweat glands, infection of hair follicles, or a minor injury to the skin. As the body tries to fight the infection, pus collects in the area and creates pressure under the skin. This pressure causes pain. People with weakened immune systems have difficulty fighting infections and get certain abscesses more often.  SYMPTOMS Usually an abscess develops on the skin and becomes a painful mass that is red, warm, and tender. If the abscess forms under the skin, you may feel a moveable soft area under the skin. Some abscesses break open (rupture) on their own, but most will continue to get worse without care. The infection can spread deeper into the body and eventually into the bloodstream, causing you to feel ill.  DIAGNOSIS  Your caregiver will take your medical history and perform a physical exam. A sample of fluid may also be taken from the abscess to determine what is causing your infection. TREATMENT  Your caregiver may prescribe antibiotic medicines to fight the infection. However, taking antibiotics alone usually does not cure an abscess. Your caregiver may need to make a small cut (incision) in the abscess to drain the pus. In some cases, gauze is packed into the abscess to reduce pain and to continue draining the area. HOME CARE INSTRUCTIONS   Only take over-the-counter or prescription medicines for pain, discomfort, or fever as directed by your caregiver.  If you were prescribed antibiotics, take them as directed. Finish them even if you start to feel better.  If gauze is used, follow your caregiver's  directions for changing the gauze.  To avoid spreading the infection:  Keep your draining abscess covered with a bandage.  Wash your hands well.  Do not share personal care items, towels, or whirlpools with others.  Avoid skin contact with others.  Keep your skin and clothes clean around the abscess.  Keep all follow-up appointments as directed by your caregiver. SEEK MEDICAL CARE IF:   You have increased pain, swelling, redness, fluid drainage, or bleeding.  You have muscle aches, chills, or a general ill feeling.  You have a fever. MAKE SURE YOU:   Understand these instructions.  Will watch your condition.  Will get help right away if you are not doing well or get worse.   This information is not intended to replace advice given to you by your health care provider. Make sure you discuss any questions you have with your health care provider.   Document Released: 12/29/2004 Document Revised: 09/20/2011 Document Reviewed: 06/03/2011 Elsevier Interactive Patient Education [employer].

## 2015-06-23 NOTE — Progress Notes (Addendum)
Subjective:    Patient ID: Sierra Carpenter, female    DOB: [birth year], 70 y.o.   MRN: [id number]  HPI Patient presents today with 1 week history of several bumps under her left arm. They have gotten progressively larger and more painful until draining yesterday. She had some pus like drainage with resulting decreased pain and swelling. No fever or chills. Otherwise feels well.   She was seen by Dr. Irene Limbo for her elevated HCT and was told her labs look ok and no further work up is needed. He did however recommend that she have a sleep study due to elevated CO2 levels.   She is going to have her screening colonoscopy by Dr. Oletta Lamas this spring.  She continues to have a great deal of stress. Her mother and her brother are both in nursing homes and she is frequently going back and forth as well as helping with her grandchildren.  She is aware that she needs to lose weight, but has found it difficult to eat healthy with her schedule. She will be more active as the weather improves.   Past Medical History  Diagnosis Date  . Hyperlipidemia   . Hypertension   . Coronary atherosclerosis     without significant obstruction  . Aortic valve sclerosis   . History of nuclear stress test 07/12/2010    bruce protocol myoview; normal pattern of perfusion, low risk, post-stress EF 92%   Past Surgical History  Procedure Laterality Date  . Appendectomy    . Abdominal hysterectomy    . Cholecystectomy    . Cardiac catheterization      moderate stenosis in 1st diagonal & RCA  . Transthoracic echocardiogram  07/12/2010    EF=>55% with normal systolic function; trace MR/TR/PR   Family History  Problem Relation Age of Onset  . Heart disease Father   . Heart disease Brother     x3  . Heart disease Son   . Heart disease Paternal Grandfather   . Alzheimer's disease Mother    Social History  Substance Use Topics  . Smoking status: Never Smoker   . Smokeless tobacco: None  . Alcohol Use: No       Review of Systems No fever or chills, + pain left axilla, no chest pain, no SOB, no edema    Objective:   Physical Exam  Constitutional: She is oriented to person, place, and time. She appears well-developed and well-nourished. No distress.  HENT:  Head: Normocephalic and atraumatic.  Eyes: Conjunctivae are normal.  Neck: Normal range of motion. Neck supple.  Cardiovascular: Normal rate.   Pulmonary/Chest: Effort normal.  Neurological: She is alert and oriented to person, place, and time.  Skin: Skin is warm and dry. She is not diaphoretic.  Left axilla with 3 small (5 mm) areas erythema, swelling, fluctuance, evidence of recent drainage. Mildly tender to palpation.   Psychiatric: She has a normal mood and affect. Her behavior is normal. Judgment and thought content normal.  Vitals reviewed.     BP 133/81 mmHg  Pulse 80  Temp(Src) 98.8 F (37.1 C) (Oral)  Resp 16  Ht 5\' 2"  (1.575 m)  Wt 174 lb 3.2 oz (79.017 kg)  BMI 31.85 kg/m2  SpO2 97% Wt Readings from Last 3 Encounters:  06/23/15 174 lb 3.2 oz (79.017 kg)  06/01/15 171 lb 11.2 oz (77.883 kg)  05/05/15 170 lb (77.111 kg)       Assessment & Plan:  1. Abscess of axillary region -  Provided written and verbal information regarding diagnosis and treatment. - warm compresses 3-4 times a day and open to air when possible - cephALEXin (KEFLEX) 500 MG capsule; Take 1 capsule (500 mg total) by mouth 3 (three) times daily.  Dispense: 21 capsule; Refill: 0 - RTC if not resolved with above measures or if worsening  2. Essential hypertension - well controlled on current meds, continue with follow up in 6 months  3. BMI 31.0-31.9,adult - encouraged healthy food choices and increased activity    Clarene Reamer, FNP-BC  [facility] [place] [facility]  06/23/2015 4:48 PM

## 2015-06-23 NOTE — Addendum Note (Signed)
Addended by: Clarene Reamer B on: 06/23/2015 05:02 PM   Modules accepted: Orders

## 2015-07-14 ENCOUNTER — Ambulatory Visit: Payer: Medicare Other | Admitting: Family Medicine

## 2015-07-15 ENCOUNTER — Other Ambulatory Visit: Payer: Self-pay | Admitting: Gastroenterology

## 2015-07-15 DIAGNOSIS — K5730 Diverticulosis of large intestine without perforation or abscess without bleeding: Secondary | ICD-10-CM | POA: Diagnosis not present

## 2015-07-15 DIAGNOSIS — Z86010 Personal history of colonic polyps: Secondary | ICD-10-CM | POA: Diagnosis not present

## 2015-07-15 DIAGNOSIS — K640 First degree hemorrhoids: Secondary | ICD-10-CM | POA: Diagnosis not present

## 2015-07-15 DIAGNOSIS — D125 Benign neoplasm of sigmoid colon: Secondary | ICD-10-CM | POA: Diagnosis not present

## 2015-07-15 DIAGNOSIS — D126 Benign neoplasm of colon, unspecified: Secondary | ICD-10-CM | POA: Diagnosis not present

## 2015-07-16 ENCOUNTER — Ambulatory Visit (INDEPENDENT_AMBULATORY_CARE_PROVIDER_SITE_OTHER): Payer: Medicare Other | Admitting: Family Medicine

## 2015-07-16 VITALS — BP 132/74 | HR 72 | Temp 98.0°F | Resp 16 | Ht 61.0 in | Wt 170.0 lb

## 2015-07-16 DIAGNOSIS — J069 Acute upper respiratory infection, unspecified: Secondary | ICD-10-CM | POA: Diagnosis not present

## 2015-07-16 DIAGNOSIS — L02419 Cutaneous abscess of limb, unspecified: Secondary | ICD-10-CM

## 2015-07-16 DIAGNOSIS — B9789 Other viral agents as the cause of diseases classified elsewhere: Secondary | ICD-10-CM

## 2015-07-16 MED ORDER — DOXYCYCLINE HYCLATE 100 MG PO CAPS: 100.0000 mg | ORAL_CAPSULE | Freq: Two times a day (BID) | ORAL | Status: DC

## 2015-07-16 NOTE — Patient Instructions (Addendum)
Please use warm compresses 3-4x day Change bandage if it becomes saturated Return to clinic on Saturday for wound check   Abscess An abscess is an infected area that contains a collection of pus and debris.It can occur in almost any part of the body. An abscess is also known as a furuncle or boil. CAUSES  An abscess occurs when tissue gets infected. This can occur from blockage of oil or sweat glands, infection of hair follicles, or a minor injury to the skin. As the body tries to fight the infection, pus collects in the area and creates pressure under the skin. This pressure causes pain. People with weakened immune systems have difficulty fighting infections and get certain abscesses more often.  SYMPTOMS Usually an abscess develops on the skin and becomes a painful mass that is red, warm, and tender. If the abscess forms under the skin, you may feel a moveable soft area under the skin. Some abscesses break open (rupture) on their own, but most will continue to get worse without care. The infection can spread deeper into the body and eventually into the bloodstream, causing you to feel ill.  DIAGNOSIS  Your caregiver will take your medical history and perform a physical exam. A sample of fluid may also be taken from the abscess to determine what is causing your infection. TREATMENT  Your caregiver may prescribe antibiotic medicines to fight the infection. However, taking antibiotics alone usually does not cure an abscess. Your caregiver may need to make a small cut (incision) in the abscess to drain the pus. In some cases, gauze is packed into the abscess to reduce pain and to continue draining the area. HOME CARE INSTRUCTIONS   Only take over-the-counter or prescription medicines for pain, discomfort, or fever as directed by your caregiver.  If you were prescribed antibiotics, take them as directed. Finish them even if you start to feel better.  If gauze is used, follow your caregiver's  directions for changing the gauze.  To avoid spreading the infection:  Keep your draining abscess covered with a bandage.  Wash your hands well.  Do not share personal care items, towels, or whirlpools with others.  Avoid skin contact with others.  Keep your skin and clothes clean around the abscess.  Keep all follow-up appointments as directed by your caregiver. SEEK MEDICAL CARE IF:   You have increased pain, swelling, redness, fluid drainage, or bleeding.  You have muscle aches, chills, or a general ill feeling.  You have a fever. MAKE SURE YOU:   Understand these instructions.  Will watch your condition.  Will get help right away if you are not doing well or get worse.   This information is not intended to replace advice given to you by your health care provider. Make sure you discuss any questions you have with your health care provider.   Document Released: 12/29/2004 Document Revised: 09/20/2011 Document Reviewed: 06/03/2011 Elsevier Interactive Patient Education 2016 [employer].    IF you received an x-ray today, you will receive an invoice from [facility]. Please contact [facility] at [phone] with questions or concerns regarding your invoice.   IF you received labwork today, you will receive an invoice from [employer]. Please contact [facility] at [phone] with questions or concerns regarding your invoice.   Our billing staff will not be able to assist you with questions regarding bills from these companies.  You will be contacted with the lab results as soon as they are available.  The fastest way to get your results is to activate your My Chart account. Instructions are located on the last page of this paperwork. If you have not heard from [place] regarding the results in 2 weeks, please contact this office.

## 2015-07-16 NOTE — Progress Notes (Signed)
Subjective:    Patient ID: Sierra Carpenter, female    DOB: 1946/03/06, 70 y.o.   MRN: [id number]  HPI This is a pleasant 70 yo female who presents today with 2 days of left axillary pain/swelling. She had several small abscesses at last visit that she reports resolved with warm compresses. She noticed a new area of swelling, pain and redness that started yesterday. She has been using warm compresses. No meds for pain.   She had her screening colonoscopy yesterday with Dr. Oletta Lamas. One very small polyp removed. Patient tolerated procedure well.   She started to have runny nose and dry cough yesterday. No wheeze, no SOB, no fever.   Past Medical History  Diagnosis Date  . Hyperlipidemia   . Hypertension   . Coronary atherosclerosis     without significant obstruction  . Aortic valve sclerosis   . History of nuclear stress test 07/12/2010    bruce protocol myoview; normal pattern of perfusion, low risk, post-stress EF 92%   Past Surgical History  Procedure Laterality Date  . Appendectomy    . Abdominal hysterectomy    . Cholecystectomy    . Cardiac catheterization      moderate stenosis in 1st diagonal & RCA  . Transthoracic echocardiogram  07/12/2010    EF=>55% with normal systolic function; trace MR/TR/PR   Family History  Problem Relation Age of Onset  . Heart disease Father   . Heart disease Brother     x3  . Heart disease Son   . Heart disease Paternal Grandfather   . Alzheimer's disease Mother    Social History  Substance Use Topics  . Smoking status: Never Smoker   . Smokeless tobacco: None  . Alcohol Use: No      Review of Systems Per HPI    Objective:   Physical Exam  Constitutional: She is oriented to person, place, and time. She appears well-developed and well-nourished.  HENT:  Head: Normocephalic and atraumatic.  Right Ear: Tympanic membrane, external ear and ear canal normal.  Left Ear: Tympanic membrane, external ear and ear canal normal.  Nose:  Mucosal edema and rhinorrhea present.  Mouth/Throat: Uvula is midline. No oropharyngeal exudate, posterior oropharyngeal edema or posterior oropharyngeal erythema.  Cardiovascular: Normal rate, regular rhythm and normal heart sounds.   Pulmonary/Chest: Effort normal and breath sounds normal.  Musculoskeletal: Normal range of motion.  Neurological: She is alert and oriented to person, place, and time.  Skin: Skin is warm and dry.  Left axilla with 4 cm area firmness, swelling, erythema, tenderness. Small area of spontaneous purulence.   Psychiatric: She has a normal mood and affect. Her behavior is normal. Judgment and thought content normal.  Vitals reviewed.     BP 132/74 mmHg  Pulse 72  Temp(Src) 98 F (36.7 C)  Resp 16  Ht 5\' 1"  (1.549 m)  Wt 170 lb (77.111 kg)  BMI 32.14 kg/m2  SpO2 98% Wt Readings from Last 3 Encounters:  07/16/15 170 lb (77.111 kg)  06/23/15 174 lb 3.2 oz (79.017 kg)  06/01/15 171 lb 11.2 oz (77.883 kg)  Procedure note: VCO. Area prepped with betadine and alcohol. Local anesthesia obtained with lidocaine 2% with eip, 2 cc. No 11 blade used to make 1 cm incision with moderate amount purulent drainage. Wound culture obtained. Area explored with hemostats. No tracks found. Quarter inch packing and dressing applied.       Assessment & Plan:  1. Axillary abscess - doxycycline (VIBRAMYCIN) 100  MG capsule; Take 1 capsule (100 mg total) by mouth 2 (two) times daily.  Dispense: 14 capsule; Refill: 0 - Wound culture - Provided written and verbal information regarding diagnosis and treatment. - fast track card provided for wound care in 2 days. Return sooner if she develops worsening pain, swelling, fever  2. Viral URI with cough - increase fluids, OTC Delsym, long acting antihistamine prn   Clarene Reamer, FNP-BC  [facility] [place] [facility]  07/16/2015 9:55 PM

## 2015-07-18 ENCOUNTER — Ambulatory Visit (INDEPENDENT_AMBULATORY_CARE_PROVIDER_SITE_OTHER): Payer: Medicare Other | Admitting: Osteopathic Medicine

## 2015-07-18 VITALS — BP 112/72 | HR 79 | Temp 97.6°F | Resp 18

## 2015-07-18 DIAGNOSIS — L02419 Cutaneous abscess of limb, unspecified: Secondary | ICD-10-CM

## 2015-07-18 NOTE — Progress Notes (Signed)
Subjective:    Patient ID: Sierra Carpenter, female    DOB: 05-May-1945, 70 y.o.   MRN: [id number]  Wound Check She was originally treated 2 to 3 days ago. Previous treatment included oral antibiotics and I&D of abscess. The maximum temperature noted was less than 100.4 F. The temperature was taken using an oral thermometer. Wound drainage status: still some purulent drainage expressed. Swelling Status: Still some present anterior to incision. The pain has improved.   This is a pleasant 70 yo female who presented 2 days ago with 2 days of left axillary pain/swelling. She has been using warm compresses before coming on for I&D and is here today for woundrecheck. No meds for pain.    Past Medical History  Diagnosis Date  . Hyperlipidemia   . Hypertension   . Coronary atherosclerosis     without significant obstruction  . Aortic valve sclerosis   . History of nuclear stress test 07/12/2010    bruce protocol myoview; normal pattern of perfusion, low risk, post-stress EF 92%   Past Surgical History  Procedure Laterality Date  . Appendectomy    . Abdominal hysterectomy    . Cholecystectomy    . Cardiac catheterization      moderate stenosis in 1st diagonal & RCA  . Transthoracic echocardiogram  07/12/2010    EF=>55% with normal systolic function; trace MR/TR/PR   Family History  Problem Relation Age of Onset  . Heart disease Father   . Heart disease Brother     x3  . Heart disease Son   . Heart disease Paternal Grandfather   . Alzheimer's disease Mother    Social History  Substance Use Topics  . Smoking status: Never Smoker   . Smokeless tobacco: None  . Alcohol Use: No      Review of Systems Per HPI    Objective:   Physical Exam  Constitutional: She is oriented to person, place, and time. She appears well-developed and well-nourished.  HENT:  Head: Normocephalic and atraumatic.  Mouth/Throat: Uvula is midline.  Pulmonary/Chest: Effort normal and breath sounds normal.    Musculoskeletal: Normal range of motion.  Neurological: She is alert and oriented to person, place, and time.  Skin: Skin is warm and dry.  Left axilla with 1cm incision, draining some purulence, packing exchanged, area of swelling/induration to anterior of incision but no fluctuance, tender but not exquisitely painful  Psychiatric: She has a normal mood and affect. Her behavior is normal. Judgment and thought content normal.  Vitals reviewed.     BP 112/72 mmHg  Pulse 79  Temp(Src) 97.6 F (36.4 C) (Oral)  Resp 18  SpO2 98% Wt Readings from Last 3 Encounters:  07/16/15 170 lb (77.111 kg)  06/23/15 174 lb 3.2 oz (79.017 kg)  06/01/15 171 lb 11.2 oz (77.883 kg)        Assessment & Plan:  1. Axillary abscess - continue doxycycline (VIBRAMYCIN) 100 MG capsule; Take 1 capsule (100 mg total) by mouth 2 (two) times daily.  Dispense: 14 capsule; Refill: 0 - Wound culture prelim results reviewed Gram(+) cocci in clusters - Provided written and verbal information regarding diagnosis and treatment. - fast track card provided for wound care in 2 days. Return sooner if she develops worsening pain, swelling, fever  . We need another wound check 2 days, worse come back sooner, area of induration/tenderness anterior to the incision most likely edema/swelling, given her improvement in symptoms I doubt there is another pocket of abscess, however  if worse or is not improving would recommend another incision   Return in about 2 days (around 07/20/2015) for WOUND RECHECK.

## 2015-07-18 NOTE — Patient Instructions (Addendum)
CONTINUE WITH PREVIOUS HOME CARE INSTRUCTIONS, COME SEE [place] ASAP IF SKIN IS LOOKING OR FEELING WORSE, IF YOU HAVE SIGNIFICANT PAIN OR DEVELOP FEVER. CALL WITH ANY QUESTIONS OR CONCERNS. TAKE CARE! -DR. A.     IF you received an x-ray today, you will receive an invoice from [facility]. Please contact [facility] at [phone] with questions or concerns regarding your invoice.   IF you received labwork today, you will receive an invoice from [employer]. Please contact [facility] at [phone] with questions or concerns regarding your invoice.   Our billing staff will not be able to assist you with questions regarding bills from these companies.  You will be contacted with the lab results as soon as they are available. The fastest way to get your results is to activate your My Chart account. Instructions are located on the last page of this paperwork. If you have not heard from [place] regarding the results in 2 weeks, please contact this office.

## 2015-07-19 LAB — WOUND CULTURE: Gram Stain: NONE SEEN

## 2015-07-20 ENCOUNTER — Ambulatory Visit (INDEPENDENT_AMBULATORY_CARE_PROVIDER_SITE_OTHER): Payer: Medicare Other | Admitting: Physician Assistant

## 2015-07-20 VITALS — BP 122/70 | HR 96 | Temp 97.9°F | Resp 16 | Ht 61.0 in | Wt 170.6 lb

## 2015-07-20 DIAGNOSIS — L02419 Cutaneous abscess of limb, unspecified: Secondary | ICD-10-CM

## 2015-07-20 DIAGNOSIS — Z9889 Other specified postprocedural states: Secondary | ICD-10-CM

## 2015-07-20 NOTE — Patient Instructions (Addendum)
   IF you received an x-ray today, you will receive an invoice from [facility]. Please contact [facility] at [phone] with questions or concerns regarding your invoice.   IF you received labwork today, you will receive an invoice from [employer]. Please contact [facility] at [phone] with questions or concerns regarding your invoice.   Our billing staff will not be able to assist you with questions regarding bills from these companies.  You will be contacted with the lab results as soon as they are available. The fastest way to get your results is to activate your My Chart account. Instructions are located on the last page of this paperwork. If you have not heard from [place] regarding the results in 2 weeks, please contact this office.     Please keep the area covered.  After 48 hours, you can remove the packing from the area.  You will clean the area twice per day.  Do not use deodorant soap or fragranted soaps.  You may use an antibacterial soap, or sensitive soap.  You will keep it covered until a scab or skin grows from beneath to cover the top.  It will be harder and drier.   If you have questions or concerns, please contact me.   We hear at [facility] offer our deep condolences and thoughts at this time.  If we can be of assistance to you, please let us know.

## 2015-07-20 NOTE — Progress Notes (Signed)
[facility] [address], [place] [address] [phone]  Date:  07/20/2015   Name:  Sierra Carpenter   DOB:  Aug 06, 1945   MRN:  [id number]  PCP:  Jenny Reichmann, MD   History of Present Illness:  Sierra Carpenter is a 70 y.o. female patient who presents to [facility] for follow up of left axillary abscess. This is healing well.  She is compliant of antibiotics.  She reports no swelling, pain, drainage, redness, or warmth.      Patient Active Problem List   Diagnosis Date Noted  . Polycythemia 06/01/2015  . Coronary atherosclerosis 01/01/2013  . Hypertension 05/09/2011  . Hyperlipidemia 05/09/2011  . Colon polyps 05/09/2011  . Healthcare maintenance 05/09/2011    Past Medical History  Diagnosis Date  . Hyperlipidemia   . Hypertension   . Coronary atherosclerosis     without significant obstruction  . Aortic valve sclerosis   . History of nuclear stress test 07/12/2010    bruce protocol myoview; normal pattern of perfusion, low risk, post-stress EF 92%    Past Surgical History  Procedure Laterality Date  . Appendectomy    . Abdominal hysterectomy    . Cholecystectomy    . Cardiac catheterization      moderate stenosis in 1st diagonal & RCA  . Transthoracic echocardiogram  07/12/2010    EF=>55% with normal systolic function; trace MR/TR/PR    Social History  Substance Use Topics  . Smoking status: Never Smoker   . Smokeless tobacco: None  . Alcohol Use: No    Family History  Problem Relation Age of Onset  . Heart disease Father   . Heart disease Brother     x3  . Heart disease Son   . Heart disease Paternal Grandfather   . Alzheimer's disease Mother     No Known Allergies  Medication list has been reviewed and updated.  Current Outpatient Prescriptions on File Prior to Visit  Medication Sig Dispense Refill  . albuterol (PROVENTIL HFA;VENTOLIN HFA) 108 (90 Base) MCG/ACT inhaler Inhale 2 puffs into the lungs every 4 (four) hours as needed for  wheezing or shortness of breath (cough, shortness of breath or wheezing.). 1 Inhaler 1  . Ascorbic Acid (VITAMIN C PO) Take by mouth daily.    Marland Kitchen aspirin 81 MG tablet Take 160 mg by mouth daily.    Marland Kitchen atorvastatin (LIPITOR) 40 MG tablet TAKE ONE TABLET BY MOUTH ONCE DAILY 30 tablet 8  . co-enzyme Q-10 30 MG capsule Take 100 mg by mouth 2 (two) times daily.    Marland Kitchen doxycycline (VIBRAMYCIN) 100 MG capsule Take 1 capsule (100 mg total) by mouth 2 (two) times daily. 14 capsule 0  . erythromycin ophthalmic ointment Place 1 application into the left eye at bedtime. 3.5 g 0  . fish oil-omega-3 fatty acids 1000 MG capsule Take 1 capsule by mouth 3 (three) times daily.    . Ipratropium-Albuterol (ALBUTEROL-IPRATROPIUM IN) Inhale into the lungs 4 (four) times daily as needed. Reported on 04/04/2015    . losartan-hydrochlorothiazide (HYZAAR) 50-12.5 MG tablet TAKE ONE TABLET BY MOUTH ONCE DAILY 90 tablet 2  . Multiple Vitamin (MULTIVITAMIN) tablet Take 1 tablet by mouth daily.    . Multiple Vitamins-Minerals (PRESERVISION AREDS 2 PO) Take by mouth 2 (two) times daily.     No current facility-administered medications on file prior to visit.    ROS ROS otherwise unremarkable unless listed above  Physical Examination: BP 122/70 mmHg  Pulse 96  Temp(Src) 97.9 F (36.6 C) (Oral)  Resp 16  Ht 5\' 1"  (1.549 m)  Wt 170 lb 9.6 oz (77.384 kg)  BMI 32.25 kg/m2  SpO2 98% Ideal Body Weight: Weight in (lb) to have BMI = 25: 132  Physical Exam  Constitutional: She is oriented to person, place, and time. She appears well-developed and well-nourished. No distress.  HENT:  Head: Normocephalic and atraumatic.  Right Ear: External ear normal.  Left Ear: External ear normal.  Eyes: Conjunctivae and EOM are normal. Pupils are equal, round, and reactive to light.  Cardiovascular: Normal rate.   Pulmonary/Chest: Effort normal. No respiratory distress.  Neurological: She is alert and oriented to person, place, and  time.  Skin: She is not diaphoretic.  Left axillary with packing in place.  Removed with minimal purulent drainage,  Tissue is beefy red, without much tenderness.  There is no localized erythema.  Psychiatric: She has a normal mood and affect. Her behavior is normal.     Assessment and Plan: Sierra Carpenter is a 70 y.o. female who is here today for wound care follow up. Advised wound care--removing packing in 2 days, and cleaning with soap and water, while keeping dressing until scab or secondary dermis surfaces.  Abscess of axillary region  Status post incision and drainage  Ivar Drape, PA-C [facility] 5/15/20175:07 PM    Ivar Drape, PA-C [facility] 07/20/2015 9:05 AM

## 2015-08-28 ENCOUNTER — Ambulatory Visit (INDEPENDENT_AMBULATORY_CARE_PROVIDER_SITE_OTHER): Payer: Medicare Other | Admitting: Physician Assistant

## 2015-08-28 VITALS — BP 120/70 | HR 81 | Temp 97.9°F | Resp 18 | Ht 61.0 in | Wt 174.0 lb

## 2015-08-28 DIAGNOSIS — W57XXXA Bitten or stung by nonvenomous insect and other nonvenomous arthropods, initial encounter: Secondary | ICD-10-CM | POA: Diagnosis not present

## 2015-08-28 DIAGNOSIS — S80862A Insect bite (nonvenomous), left lower leg, initial encounter: Secondary | ICD-10-CM

## 2015-08-28 DIAGNOSIS — R05 Cough: Secondary | ICD-10-CM

## 2015-08-28 DIAGNOSIS — I350 Nonrheumatic aortic (valve) stenosis: Secondary | ICD-10-CM | POA: Insufficient documentation

## 2015-08-28 DIAGNOSIS — R059 Cough, unspecified: Secondary | ICD-10-CM

## 2015-08-28 MED ORDER — IPRATROPIUM BROMIDE 0.03 % NA SOLN: 2.0000 | Freq: Two times a day (BID) | NASAL | Status: DC

## 2015-08-28 MED ORDER — BENZONATATE 100 MG PO CAPS: 100.0000 mg | ORAL_CAPSULE | Freq: Three times a day (TID) | ORAL | Status: DC | PRN

## 2015-08-28 NOTE — Progress Notes (Signed)
Patient ID: Sierra Carpenter, female    DOB: [birth year], 70 y.o.   MRN: [id number]  PCP: Elby Beck, FNP (previously followed by Dr. Everlene Farrier)  Subjective:   Chief Complaint  Patient presents with  . Cough  . Insect Bite    HPI Presents for evaluation of cough. She also notes an itchy bump on the back of her leg.  Cough x 1 week. Awoke one morning coughing. Mucinex and Robitussin without benefit. Seems worse at night. When it's productive, sputum is clear. When blows her nose, it's clear. No fever, chills. No nausea or vomiting. Slight headache "once in a while when my sinuses are draining." "I feel like most of it is coming from my sinuses."  Thinks she got stung/bitten by something in the yard, but doesn't know what. Hasn't applied anything  To the area.   Review of Systems As above.    Patient Active Problem List   Diagnosis Date Noted  . Polycythemia 06/01/2015  . Coronary atherosclerosis 01/01/2013  . Hypertension 05/09/2011  . Hyperlipidemia 05/09/2011  . Colon polyps 05/09/2011     Prior to Admission medications   Medication Sig Start Date End Date Taking? Authorizing Provider  albuterol (PROVENTIL HFA;VENTOLIN HFA) 108 (90 Base) MCG/ACT inhaler Inhale 2 puffs into the lungs every 4 (four) hours as needed for wheezing or shortness of breath (cough, shortness of breath or wheezing.). 04/04/15  Yes Stephanie D English, PA  Ascorbic Acid (VITAMIN C PO) Take by mouth daily.   Yes Historical Provider, MD  aspirin 81 MG tablet Take 160 mg by mouth daily.   Yes Historical Provider, MD  atorvastatin (LIPITOR) 40 MG tablet TAKE ONE TABLET BY MOUTH ONCE DAILY 04/13/15  Yes Mihai Croitoru, MD  co-enzyme Q-10 30 MG capsule Take 100 mg by mouth 2 (two) times daily.   Yes Historical Provider, MD  erythromycin ophthalmic ointment Place 1 application into the left eye at bedtime. 05/05/15  Yes Darlyne Russian, MD  fish oil-omega-3 fatty acids 1000 MG capsule Take 1 capsule  by mouth 3 (three) times daily.   Yes Historical Provider, MD  losartan-hydrochlorothiazide (HYZAAR) 50-12.5 MG tablet TAKE ONE TABLET BY MOUTH ONCE DAILY 03/31/15  Yes Mihai Croitoru, MD  Multiple Vitamin (MULTIVITAMIN) tablet Take 1 tablet by mouth daily.   Yes Historical Provider, MD  Multiple Vitamins-Minerals (PRESERVISION AREDS 2 PO) Take by mouth 2 (two) times daily.   Yes Historical Provider, MD     No Known Allergies     Objective:  Physical Exam  Constitutional: She is oriented to person, place, and time. She appears well-developed and well-nourished. She is active and cooperative. No distress.  BP 120/70 mmHg  Pulse 81  Temp(Src) 97.9 F (36.6 C) (Oral)  Resp 18  Ht 5\' 1"  (1.549 m)  Wt 174 lb (78.926 kg)  BMI 32.89 kg/m2  SpO2 97%  HENT:  Head: Normocephalic and atraumatic.  Right Ear: Hearing, tympanic membrane, external ear and ear canal normal.  Left Ear: Hearing, tympanic membrane, external ear and ear canal normal.  Nose: Nose normal. Right sinus exhibits no maxillary sinus tenderness and no frontal sinus tenderness. Left sinus exhibits no maxillary sinus tenderness and no frontal sinus tenderness.  Mouth/Throat: Uvula is midline, oropharynx is clear and moist and mucous membranes are normal. No oral lesions. No uvula swelling.  Eyes: Conjunctivae are normal. No scleral icterus.  Neck: Normal range of motion. Neck supple. No thyromegaly present.  Cardiovascular: Normal rate and regular rhythm.  Murmur heard.  Systolic (heart best in the aortic space) murmur is present with a grade of 2/6  Pulses:      Radial pulses are 2+ on the right side, and 2+ on the left side.  Pulmonary/Chest: Effort normal and breath sounds normal.  Lymphadenopathy:       Head (right side): No tonsillar, no preauricular, no posterior auricular and no occipital adenopathy present.       Head (left side): No tonsillar, no preauricular, no posterior auricular and no occipital adenopathy  present.    She has no cervical adenopathy.       Right: No supraclavicular adenopathy present.       Left: No supraclavicular adenopathy present.  Neurological: She is alert and oriented to person, place, and time. No sensory deficit.  Skin: Skin is warm, dry and intact. Lesion (4 mm raised urticalial lesion posterior LEFT leg) noted. No rash noted. No cyanosis or erythema. Nails show no clubbing.     Psychiatric: She has a normal mood and affect. Her speech is normal and behavior is normal.           Assessment & Plan:   1. Cough Supportive care.  Anticipatory guidance.  RTC if symptoms worsen/persist. - ipratropium (ATROVENT) 0.03 % nasal spray; Place 2 sprays into both nostrils 2 (two) times daily.  Dispense: 30 mL; Refill: 0 - benzonatate (TESSALON) 100 MG capsule; Take 1-2 capsules (100-200 mg total) by mouth 3 (three) times daily as needed for cough.  Dispense: 40 capsule; Refill: 0  2. Insect bite OTC steroid cream BID. Oral antihistamine.    Fara Chute, PA-C [facility]

## 2015-08-28 NOTE — Patient Instructions (Addendum)
For the insect bite, apply a small amount of OTC hydrocortisone cream or ointment tot he area 1-2 times each day. If that's not enough, you may also take an OTC oral antihistamine (like Claritin)    IF you received an x-ray today, you will receive an invoice from [facility]iology. Please contact [facility] Radiology at [phone] with questions or concerns regarding your invoice.   IF you received labwork today, you will receive an invoice from [employer]. Please contact [facility] at [phone] with questions or concerns regarding your invoice.   Our billing staff will not be able to assist you with questions regarding bills from these companies.  You will be contacted with the lab results as soon as they are available. The fastest way to get your results is to activate your My Chart account. Instructions are located on the last page of this paperwork. If you have not heard from [place] regarding the results in 2 weeks, please contact this office.

## 2015-09-10 DIAGNOSIS — Z1231 Encounter for screening mammogram for malignant neoplasm of breast: Secondary | ICD-10-CM | POA: Diagnosis not present

## 2015-09-11 ENCOUNTER — Ambulatory Visit (INDEPENDENT_AMBULATORY_CARE_PROVIDER_SITE_OTHER): Payer: Medicare Other | Admitting: Family Medicine

## 2015-09-11 VITALS — BP 122/70 | HR 77 | Temp 97.9°F | Resp 16 | Ht 61.0 in | Wt 173.4 lb

## 2015-09-11 DIAGNOSIS — L989 Disorder of the skin and subcutaneous tissue, unspecified: Secondary | ICD-10-CM

## 2015-09-11 DIAGNOSIS — S60862A Insect bite (nonvenomous) of left wrist, initial encounter: Secondary | ICD-10-CM

## 2015-09-11 DIAGNOSIS — S20462A Insect bite (nonvenomous) of left back wall of thorax, initial encounter: Secondary | ICD-10-CM | POA: Diagnosis not present

## 2015-09-11 DIAGNOSIS — W57XXXA Bitten or stung by nonvenomous insect and other nonvenomous arthropods, initial encounter: Secondary | ICD-10-CM | POA: Diagnosis not present

## 2015-09-11 DIAGNOSIS — R238 Other skin changes: Secondary | ICD-10-CM

## 2015-09-11 MED ORDER — MUPIROCIN CALCIUM 2 % EX CREA: 1.0000 "application " | TOPICAL_CREAM | Freq: Two times a day (BID) | CUTANEOUS | Status: DC

## 2015-09-11 NOTE — Progress Notes (Signed)
Subjective:    Patient ID: Sierra Carpenter, female    DOB: 07/22/1945, 70 y.o.   MRN: [id number]  HPI This is a pleasant 70 yo female who presents today with two areas of skin irritation for 5 days; she thinks they are from bug bites. She has recently been gardening and noticed a red spot on her left wrist and a red spot on her left shoulder. They are not particularly itchy. The area on her left wrist has possibly been exacerbated by her watch band. It has gotten slightly larger since Jul 06, 2022. She did not see or feel an insect. She has not had any known tick bites. No fever/chills. No muscle or joint pain.   Her mother passed away the end of 06-Jul-2022. She is still looking after her brother.   Past Medical History  Diagnosis Date  . Hyperlipidemia   . Hypertension   . Coronary atherosclerosis     without significant obstruction  . Aortic valve sclerosis   . History of nuclear stress test 07/12/2010    bruce protocol myoview; normal pattern of perfusion, low risk, post-stress EF 92%   Past Surgical History  Procedure Laterality Date  . Appendectomy    . Abdominal hysterectomy    . Cholecystectomy    . Cardiac catheterization      moderate stenosis in 1st diagonal & RCA  . Transthoracic echocardiogram  07/12/2010    EF=>55% with normal systolic function; trace MR/TR/PR   Family History  Problem Relation Age of Onset  . Heart disease Father   . Heart disease Brother   . Heart disease Son   . Heart disease Paternal Grandfather   . Alzheimer's disease Mother   . Heart disease Brother   . Heart disease Brother    Social History  Substance Use Topics  . Smoking status: Never Smoker   . Smokeless tobacco: None  . Alcohol Use: No      Review of Systems Per HPI    Objective:   Physical Exam  Constitutional: She is oriented to person, place, and time. She appears well-developed and well-nourished.  HENT:  Head: Normocephalic.  Eyes: Conjunctivae are normal.  Cardiovascular:  Normal rate.   Pulmonary/Chest: Effort normal.  Neurological: She is alert and oriented to person, place, and time.  Skin: Skin is warm and dry.  Left medial wrist with 2.5 cm area slightly raised erythema, small central white area. No warmth, no streaking. Left side of upper back with 3 cm area erythema, no warmth.   Psychiatric: She has a normal mood and affect. Her behavior is normal. Judgment and thought content normal.  Vitals reviewed.     BP 122/70 mmHg  Pulse 77  Temp(Src) 97.9 F (36.6 C) (Oral)  Resp 16  Ht 5\' 1"  (1.549 m)  Wt 173 lb 6.4 oz (78.654 kg)  BMI 32.78 kg/m2  SpO2 97% Wt Readings from Last 3 Encounters:  09/11/15 173 lb 6.4 oz (78.654 kg)  08/28/15 174 lb (78.926 kg)  07/20/15 170 lb 9.6 oz (77.384 kg)       Assessment & Plan:  1. Skin irritation - mupirocin cream (BACTROBAN) 2 %; Apply 1 application topically 2 (two) times daily.  Dispense: 15 g; Refill: 0  2. Insect bite - mupirocin cream (BACTROBAN) 2 %; Apply 1 application topically 2 (two) times daily.  Dispense: 15 g; Refill: 0 - keep area clean and dry - I have instructed her to notify me if she develops any increased erythema,  fever/chills/muscle/joint pain.  Clarene Reamer, FNP-BC  [facility]  09/11/2015 12:47 PM

## 2015-09-11 NOTE — Patient Instructions (Addendum)
Let me know if your redness gets worse, you develop a fever or muscle aches   Insect Bite Mosquitoes, flies, fleas, bedbugs, and many other insects can bite. Insect bites are different from insect stings. A sting is when poison (venom) is injected into the skin. Insect bites can cause pain or itching for a few days, but they are usually not serious. Some insects can spread diseases to people through a bite. SYMPTOMS  Symptoms of an insect bite include:  Itching or pain in the bite area.  Redness and swelling in the bite area.  An open wound (skin ulcer). In many cases, symptoms last for 2-4 days.  DIAGNOSIS  This condition is usually diagnosed based on symptoms and a physical exam. TREATMENT  Treatment is usually not needed for an insect bite. Symptoms often go away on their own. Your health care provider may recommend creams or lotions to help reduce itching. Antibiotic medicines may be prescribed if the bite becomes infected. A tetanus shot may be given in some cases. If you develop an allergic reaction to an insect bite, your health care provider will prescribe medicines to treat the reaction (antihistamines). This is rare. HOME CARE INSTRUCTIONS  Do not scratch the bite area.  Keep the bite area clean and dry. Wash the bite area daily with soap and water as told by your health care provider.  If directed, applyice to the bite area.  Put ice in a plastic bag.  Place a towel between your skin and the bag.  Leave the ice on for 20 minutes, 2-3 times per day.  To help reduce itching and swelling, try applying a baking soda paste, cortisone cream, or calamine lotion to the bite area as told by your health care provider.  Apply or take over-the-counter and prescription medicines only as told by your health care provider.  If you were prescribed an antibiotic medicine, use it as told by your health care provider. Do not stop using the antibiotic even if your condition  improves.  Keep all follow-up visits as told by your health care provider. This is important. PREVENTION   Use insect repellent. The best insect repellents contain:  DEET, picaridin, oil of lemon eucalyptus (OLE), or IR3535.  Higher amounts of an active ingredient.  When you are outdoors, wear clothing that covers your arms and legs.  Avoid opening windows that do not have window screens. SEEK MEDICAL CARE IF:  You have increased redness, swelling, or pain in the bite area.  You have a fever. SEEK IMMEDIATE MEDICAL CARE IF:   You have joint pain.   You have fluid, blood, or pus coming from the bite area.  You have a headache or neck pain.  You have unusual weakness.  You have a rash.  You have chest pain or shortness of breath.  You have abdominal pain, nausea, or vomiting.  You feel unusually tired or sleepy.   This information is not intended to replace advice given to you by your health care provider. Make sure you discuss any questions you have with your health care provider.   Document Released: 04/28/2004 Document Revised: 12/10/2014 Document Reviewed: 08/06/2014 Elsevier Interactive Patient Education 2016 [employer].    IF you received an x-ray today, you will receive an invoice from [facility] Radiology. Please contact [facility] at [phone] with questions or concerns regarding your invoice.   IF you received labwork today, you will receive an invoice from [employer]. Please contact [facility] at  [phone] with questions or concerns regarding your invoice.   Our billing staff will not be able to assist you with questions regarding bills from these companies.  You will be contacted with the lab results as soon as they are available. The fastest way to get your results is to activate your My Chart account. Instructions are located on the last page of this paperwork. If you have not heard from [place] regarding the  results in 2 weeks, please contact this office.

## 2015-09-17 ENCOUNTER — Other Ambulatory Visit: Payer: Self-pay | Admitting: Obstetrics and Gynecology

## 2015-09-17 DIAGNOSIS — R928 Other abnormal and inconclusive findings on diagnostic imaging of breast: Secondary | ICD-10-CM

## 2015-09-23 ENCOUNTER — Ambulatory Visit
Admission: RE | Admit: 2015-09-23 | Discharge: 2015-09-23 | Disposition: A | Discharge status: Home / Self Care | Payer: Medicare Other | Source: Ambulatory Visit | Attending: Obstetrics and Gynecology | Admitting: Obstetrics and Gynecology

## 2015-09-23 DIAGNOSIS — R921 Mammographic calcification found on diagnostic imaging of breast: Secondary | ICD-10-CM | POA: Diagnosis not present

## 2015-09-23 DIAGNOSIS — R928 Other abnormal and inconclusive findings on diagnostic imaging of breast: Secondary | ICD-10-CM

## 2015-11-05 ENCOUNTER — Ambulatory Visit: Payer: Medicare Other | Admitting: Emergency Medicine

## 2015-11-18 ENCOUNTER — Ambulatory Visit (INDEPENDENT_AMBULATORY_CARE_PROVIDER_SITE_OTHER): Payer: Medicare Other | Admitting: Family Medicine

## 2015-11-18 ENCOUNTER — Encounter: Payer: Self-pay | Admitting: Family Medicine

## 2015-11-18 VITALS — BP 122/70 | HR 74 | Temp 98.1°F | Resp 18 | Ht 61.0 in | Wt 164.0 lb

## 2015-11-18 DIAGNOSIS — Z1159 Encounter for screening for other viral diseases: Secondary | ICD-10-CM

## 2015-11-18 DIAGNOSIS — E2839 Other primary ovarian failure: Secondary | ICD-10-CM | POA: Diagnosis not present

## 2015-11-18 DIAGNOSIS — I2510 Atherosclerotic heart disease of native coronary artery without angina pectoris: Secondary | ICD-10-CM | POA: Diagnosis not present

## 2015-11-18 DIAGNOSIS — E7800 Pure hypercholesterolemia, unspecified: Secondary | ICD-10-CM | POA: Diagnosis not present

## 2015-11-18 DIAGNOSIS — I10 Essential (primary) hypertension: Secondary | ICD-10-CM

## 2015-11-18 DIAGNOSIS — I358 Other nonrheumatic aortic valve disorders: Secondary | ICD-10-CM

## 2015-11-18 LAB — CBC WITH DIFFERENTIAL/PLATELET
BASOS PCT: 1 %
Basophils Absolute: 85 cells/uL (ref 0–200)
EOS PCT: 7 %
Eosinophils Absolute: 595 cells/uL — ABNORMAL HIGH (ref 15–500)
HEMATOCRIT: 47.1 % — AB (ref 35.0–45.0)
Hemoglobin: 16.4 g/dL — ABNORMAL HIGH (ref 11.7–15.5)
LYMPHS ABS: 1870 {cells}/uL (ref 850–3900)
LYMPHS PCT: 22 %
MCH: 32 pg (ref 27.0–33.0)
MCHC: 34.8 g/dL (ref 32.0–36.0)
MCV: 92 fL (ref 80.0–100.0)
MONO ABS: 510 {cells}/uL (ref 200–950)
MPV: 10.3 fL (ref 7.5–12.5)
Monocytes Relative: 6 %
Neutro Abs: 5440 cells/uL (ref 1500–7800)
Neutrophils Relative %: 64 %
Platelets: 316 10*3/uL (ref 140–400)
RBC: 5.12 MIL/uL — AB (ref 3.80–5.10)
RDW: 13.3 % (ref 11.0–15.0)
WBC: 8.5 10*3/uL (ref 3.8–10.8)

## 2015-11-18 LAB — COMPREHENSIVE METABOLIC PANEL
ALK PHOS: 73 U/L (ref 33–130)
ALT: 18 U/L (ref 6–29)
AST: 19 U/L (ref 10–35)
Albumin: 4.7 g/dL (ref 3.6–5.1)
BILIRUBIN TOTAL: 0.5 mg/dL (ref 0.2–1.2)
BUN: 18 mg/dL (ref 7–25)
CALCIUM: 10.3 mg/dL (ref 8.6–10.4)
CO2: 28 mmol/L (ref 20–31)
CREATININE: 1 mg/dL — AB (ref 0.50–0.99)
Chloride: 102 mmol/L (ref 98–110)
GLUCOSE: 93 mg/dL (ref 65–99)
Potassium: 4.3 mmol/L (ref 3.5–5.3)
SODIUM: 141 mmol/L (ref 135–146)
Total Protein: 6.7 g/dL (ref 6.1–8.1)

## 2015-11-18 LAB — LIPID PANEL
Cholesterol: 136 mg/dL (ref 125–200)
HDL: 43 mg/dL — ABNORMAL LOW (ref >=46)
LDL CALC: 64 mg/dL (ref <130)
Total CHOL/HDL Ratio: 3.2 Ratio (ref <=5.0)
Triglycerides: 143 mg/dL (ref <150)
VLDL: 29 mg/dL (ref <30)

## 2015-11-18 NOTE — Patient Instructions (Addendum)
IF you received an x-ray today, you will receive an invoice from [facility]. Please contact [facility] at [phone] with questions or concerns regarding your invoice.   IF you received labwork today, you will receive an invoice from [employer]. Please contact [facility] at [phone] with questions or concerns regarding your invoice.   Our billing staff will not be able to assist you with questions regarding bills from these companies.  You will be contacted with the lab results as soon as they are available. The fastest way to get your results is to activate your My Chart account. Instructions are located on the last page of this paperwork. If you have not heard from [place] regarding the results in 2 weeks, please contact this office.     High Cholesterol High cholesterol refers to having a high level of cholesterol in your blood. Cholesterol is a white, waxy, fat-like protein that your body needs in small amounts. Your liver makes all the cholesterol you need. Excess cholesterol comes from the food you eat. Cholesterol travels in your bloodstream through your blood vessels. If you have high cholesterol, deposits (plaque) may build up on the walls of your blood vessels. This makes the arteries narrower and stiffer. Plaque increases your risk of heart attack and stroke. Work with your health care provider to keep your cholesterol levels in a healthy range. RISK FACTORS Several things can make you more likely to have high cholesterol. These include:   Eating foods high in animal fat (saturated fat) or cholesterol.  Being overweight.  Not getting enough exercise.  Having a family history of high cholesterol. SIGNS AND SYMPTOMS High cholesterol does not cause symptoms. DIAGNOSIS  Your health care provider can do a blood test to check whether you have high cholesterol. If you are older than 20, your health care provider may check your  cholesterol every 4-6 years. You may be checked more often if you already have high cholesterol or other risk factors for heart disease. The blood test for cholesterol measures the following:  Bad cholesterol (LDL cholesterol). This is the type of cholesterol that causes heart disease. This number should be less than 100.  Good cholesterol (HDL cholesterol). This type helps protect against heart disease. A healthy level of HDL cholesterol is 60 or higher.  Total cholesterol. This is the combined number of LDL cholesterol and HDL cholesterol. A healthy number is less than 200. TREATMENT  High cholesterol can be treated with diet changes, lifestyle changes, and medicine.   Diet changes may include eating more whole grains, fruits, vegetables, nuts, and fish. You may also have to cut back on red meat and foods with a lot of added sugar.  Lifestyle changes may include getting at least 40 minutes of aerobic exercise three times a week. Aerobic exercises include walking, biking, and swimming. Aerobic exercise along with a healthy diet can help you maintain a healthy weight. Lifestyle changes may also include quitting smoking.  If diet and lifestyle changes are not enough to lower your cholesterol, your health care provider may prescribe a statin medicine. This medicine has been shown to lower cholesterol and also lower the risk of heart disease. HOME CARE INSTRUCTIONS  Only take over-the-counter or prescription medicines as directed by your health care provider.   Follow a healthy diet as directed by your health care provider. For instance:   Eat chicken (without skin), fish, veal, shellfish, ground [place] breast, and round or loin cuts of  red meat.  Do not eat fried foods and fatty meats, such as hot dogs and salami.   Eat plenty of fruits, such as apples.   Eat plenty of vegetables, such as broccoli, potatoes, and carrots.   Eat beans, peas, and lentils.   Eat grains, such as barley,  rice, couscous, and bulgur wheat.   Eat pasta without cream sauces.   Use skim or nonfat milk and low-fat or nonfat yogurt and cheeses. Do not eat or drink whole milk, cream, ice cream, egg yolks, and hard cheeses.   Do not eat stick margarine or tub margarines that contain trans fats (also called partially hydrogenated oils).   Do not eat cakes, cookies, crackers, or other baked goods that contain trans fats.   Do not eat saturated tropical oils, such as coconut and palm oil.   Exercise as directed by your health care provider. Increase your activity level with activities such as gardening or walking.   Keep all follow-up appointments.  SEEK MEDICAL CARE IF:  You are struggling to maintain a healthy diet or weight.  You need help starting an exercise program.  You need help to stop smoking. SEEK IMMEDIATE MEDICAL CARE IF:  You have chest pain.  You have trouble breathing.   This information is not intended to replace advice given to you by your health care provider. Make sure you discuss any questions you have with your health care provider.   Document Released: 03/21/2005 Document Revised: 04/11/2014 Document Reviewed: 01/11/2013 Elsevier Interactive Patient Education [employer].

## 2015-11-18 NOTE — Progress Notes (Signed)
Subjective:    Patient ID: Sierra Carpenter, female    DOB: 11-Sep-1945, 70 y.o.   MRN: [id number]  11/18/2015  Establish Care   HPI This 70 y.o. female presents to establish care.  Last physical: 05-05-2015 Daub Pap smear:  08-18-2011; [facility]a [facility]/g[facility].  Age.   Mammogram:  09-23-2015; every six months.  Colonoscopy:  07-15-2015 Bone density:  2003; overdue and agreeable. TDAP:  2012 Pneumovax:  2016, Prevnar 2015 Zostavax:  2014 Influenza:  2016; [facility] Eye exam:   +glasses; 05/2015;  Dental exam:   HTN: Patient reports good compliance with medication, good tolerance to medication, and good symptom control.  Home BP runs 124/70.  Hypercholesterolemia: Patient reports good compliance with medication, good tolerance to medication, and good symptom control.  Coffee, vinegar, spinach corn wrap.    CAD: followed by Melvern Banker in past; now followed by Dr. Loletha Grayer; annually.  Followed by aortic sclerosis.  Grief reaction: mother died this year.    DDD lumbar: s/p ortho consultation.    Hepatitis C screening: due; requesting.   Review of Systems  Constitutional: Negative for chills, diaphoresis, fatigue and fever.  Eyes: Negative for visual disturbance.  Respiratory: Negative for cough and shortness of breath.   Cardiovascular: Negative for chest pain, palpitations and leg swelling.  Gastrointestinal: Negative for abdominal pain, constipation, diarrhea, nausea and vomiting.  Endocrine: Negative for cold intolerance, heat intolerance, polydipsia, polyphagia and polyuria.  Neurological: Negative for dizziness, tremors, seizures, syncope, facial asymmetry, speech difficulty, weakness, light-headedness, numbness and headaches.    Past Medical History:  Diagnosis Date  . Aortic valve sclerosis   . Coronary atherosclerosis    without significant obstruction  . History of nuclear stress test 07/12/2010   bruce protocol myoview; normal pattern of perfusion, low risk, post-stress EF 92%  .  Hyperlipidemia   . Hypertension    Past Surgical History:  Procedure Laterality Date  . ABDOMINAL HYSTERECTOMY     fibroids; ovaries intact.  . APPENDECTOMY    . CARDIAC CATHETERIZATION     moderate stenosis in 1st diagonal & RCA  . CHOLECYSTECTOMY    . TRANSTHORACIC ECHOCARDIOGRAM  07/12/2010   EF=>55% with normal systolic function; trace MR/TR/PR   No Known Allergies  Social History   Social History  . Marital status: Married    Spouse name: N/A  . Number of children: 1  . Years of education: 52   Occupational History  . Not on file.   Social History Main Topics  . Smoking status: Never Smoker  . Smokeless tobacco: Never Used  . Alcohol use No  . Drug use: No  . Sexual activity: Yes   Other Topics Concern  . Not on file   Social History Narrative   Marital status: widowed since 2014 due to COPD; married x 37  Years      Children: 1 daughter; 1 son died of CHF age 6 pacemaker.  4 grandchildren.      Lives: alone      Employment: retired at age 49 from [employer].      Tobacco:  never       Alcohol:  never      Exercise:  None in 2017; was walking 2 miles daily.  DDD lumbar limiting walking.      Family History  Problem Relation Age of Onset  . Heart disease Father 32    AMI  . Heart disease Brother   . Heart disease Son   . Heart disease Paternal  Grandfather   . Alzheimer's disease Mother   . Heart disease Brother   . Heart disease Brother        Objective:    BP 122/70 (BP Location: Right Arm, Patient Position: Sitting, Cuff Size: Small)   Pulse 74   Temp 98.1 F (36.7 C) (Oral)   Resp 18   Ht 5\' 1"  (1.549 m)   Wt 164 lb (74.4 kg)   SpO2 97%   BMI 30.99 kg/m  Physical Exam  Constitutional: She is oriented to person, place, and time. She appears well-developed and well-nourished. No distress.  HENT:  Head: Normocephalic and atraumatic.  Right Ear: External ear normal.  Left Ear: External ear normal.  Nose: Nose normal.  Mouth/Throat:  Oropharynx is clear and moist.  Eyes: Conjunctivae and EOM are normal. Pupils are equal, round, and reactive to light.  Neck: Normal range of motion. Neck supple. Carotid bruit is not present. No thyromegaly present.  Cardiovascular: Normal rate, regular rhythm, normal heart sounds and intact distal pulses.  Exam reveals no gallop and no friction rub.   No murmur heard. Pulmonary/Chest: Effort normal and breath sounds normal. She has no wheezes. She has no rales.  Abdominal: Soft. Bowel sounds are normal. She exhibits no distension and no mass. There is no tenderness. There is no rebound and no guarding.  Lymphadenopathy:    She has no cervical adenopathy.  Neurological: She is alert and oriented to person, place, and time. No cranial nerve deficit.  Skin: Skin is warm and dry. No rash noted. She is not diaphoretic. No erythema. No pallor.  Psychiatric: She has a normal mood and affect. Her behavior is normal.        Assessment & Plan:   1. Essential hypertension, benign   2. Pure hypercholesterolemia   3. Need for hepatitis C screening test   4. Estrogen deficiency    -stable. -obtain labs. -continue current medications. -obtain bone density scan and Hepatitis C screening   Orders Placed This Encounter  Procedures  . DG Bone Density    Standing Status:   Future    Standing Expiration Date:   01/17/2017    Order Specific Question:   Reason for Exam (SYMPTOM  OR DIAGNOSIS REQUIRED)    Answer:   annual screening; estrogen deficiency    Order Specific Question:   Preferred imaging location?    Answer:   [facility]  . CBC with Differential/Platelet  . Comprehensive metabolic panel    Order Specific Question:   Has the patient fasted?    Answer:   Yes  . Lipid panel    Order Specific Question:   Has the patient fasted?    Answer:   Yes  . Hepatitis C antibody   No orders of the defined types were placed in this encounter.   Return in about 6 months (around  05/20/2016) for complete physical examiniation.   Sierra Carpenter, M.D. [facility] [address] [place], [place]  [address] [phone] phone [phone] fax

## 2015-11-19 LAB — HEPATITIS C ANTIBODY: HCV AB: NEGATIVE

## 2015-12-01 ENCOUNTER — Encounter: Payer: Self-pay | Admitting: Family Medicine

## 2015-12-04 ENCOUNTER — Telehealth: Payer: Self-pay

## 2015-12-04 NOTE — Telephone Encounter (Signed)
Pt received flu shot 11/30/15... // pt states she has not heard about DEXA scan referral appointment; myChart shows referral expires10/31/17  [phone]

## 2015-12-04 NOTE — Telephone Encounter (Signed)
Referrals, please check on status of bone density scan.

## 2015-12-15 ENCOUNTER — Telehealth: Payer: Self-pay | Admitting: Cardiovascular Disease

## 2015-12-15 ENCOUNTER — Other Ambulatory Visit: Payer: Medicare Other

## 2015-12-15 NOTE — Telephone Encounter (Signed)
New message  Pt call to schedule appt for ov with Dr. Sallyanne Kuster. Pt states she is to receive paper work for lab work and to schedule. Please call back to discuss

## 2015-12-15 NOTE — Telephone Encounter (Signed)
Does this patient need labwork prior to October visit? Looks like she had PCP office draw last month.

## 2015-12-16 NOTE — Telephone Encounter (Signed)
Labs are in epic

## 2015-12-16 NOTE — Telephone Encounter (Signed)
Patient had blood work with PCP last month. CBC, CMP, Lipid, and HepC completed.  Do you need any additional labs completed prior to her upcoming office visit.

## 2015-12-16 NOTE — Telephone Encounter (Signed)
That should be sufficient as long as we have copies.

## 2015-12-22 ENCOUNTER — Encounter: Payer: Self-pay | Admitting: Family Medicine

## 2015-12-24 ENCOUNTER — Ambulatory Visit
Admission: RE | Admit: 2015-12-24 | Discharge: 2015-12-24 | Disposition: A | Discharge status: Home / Self Care | Payer: Medicare Other | Source: Ambulatory Visit | Attending: Family Medicine | Admitting: Family Medicine

## 2015-12-24 DIAGNOSIS — Z13820 Encounter for screening for osteoporosis: Secondary | ICD-10-CM | POA: Diagnosis not present

## 2015-12-24 DIAGNOSIS — Z780 Asymptomatic menopausal state: Secondary | ICD-10-CM | POA: Diagnosis not present

## 2015-12-24 DIAGNOSIS — E2839 Other primary ovarian failure: Secondary | ICD-10-CM

## 2015-12-30 ENCOUNTER — Other Ambulatory Visit: Payer: Self-pay | Admitting: Cardiovascular Disease

## 2015-12-30 ENCOUNTER — Encounter: Payer: Self-pay | Admitting: Family Medicine

## 2015-12-31 NOTE — Telephone Encounter (Signed)
Rx request sent to pharmacy.  

## 2016-01-13 ENCOUNTER — Other Ambulatory Visit: Payer: Self-pay | Admitting: Cardiovascular Disease

## 2016-01-15 MED ORDER — ATORVASTATIN CALCIUM 40 MG PO TABS: 40.0000 mg | ORAL_TABLET | Freq: Every day | ORAL | 0 refills | Status: DC

## 2016-01-15 NOTE — Addendum Note (Signed)
Addended by: Diana Eves on: 01/15/2016 08:47 AM   Modules accepted: Orders

## 2016-02-02 ENCOUNTER — Encounter: Payer: Self-pay | Admitting: Cardiovascular Disease

## 2016-02-02 ENCOUNTER — Ambulatory Visit (INDEPENDENT_AMBULATORY_CARE_PROVIDER_SITE_OTHER): Payer: Medicare Other | Admitting: Cardiovascular Disease

## 2016-02-02 VITALS — BP 140/77 | HR 73 | Ht 61.0 in | Wt 163.6 lb

## 2016-02-02 DIAGNOSIS — I358 Other nonrheumatic aortic valve disorders: Secondary | ICD-10-CM

## 2016-02-02 DIAGNOSIS — I2510 Atherosclerotic heart disease of native coronary artery without angina pectoris: Secondary | ICD-10-CM | POA: Diagnosis not present

## 2016-02-02 DIAGNOSIS — I10 Essential (primary) hypertension: Secondary | ICD-10-CM

## 2016-02-02 DIAGNOSIS — R0789 Other chest pain: Secondary | ICD-10-CM

## 2016-02-02 DIAGNOSIS — E7800 Pure hypercholesterolemia, unspecified: Secondary | ICD-10-CM

## 2016-02-02 NOTE — Progress Notes (Signed)
Cardiology Office Note    Date:  02/06/2016   ID:  Sierra Carpenter, DOB [birth year], MRN [id number]  PCP:  Sierra Forts, MD  Cardiologist:   Sanda Klein, MD   Chief Complaint  Patient presents with  . Follow-up    chest tighting and fluttering; randomly.     History of Present Illness:  Sierra Carpenter is a 70 y.o. female with coronary artery disease, hypertension and hyperlipidemia.  She describes a couple of episodes of chest tightness when she gets emotionally upset. Her mother has passed away, but she still has a lot of stress related to care for her brother. She does not have chest discomfort when she performs physical activity such as raking the leaves in her yard.  In 2000 she had coronary angiography that showed minor coronary artery disease (50% ostial first diagonal artery, 30% proximal RCA) and she had a nuclear stress test in 2012 was normal. Also in 2012 for echocardiogram showed valve sclerosis without stenosis. Today her aortic ejection murmur sounds very loud.  The patient specifically denies dyspnea at rest or with exertion, orthopnea, paroxysmal nocturnal dyspnea, syncope, palpitations, focal neurological deficits, intermittent claudication, lower extremity edema, unexplained weight gain, cough, hemoptysis or wheezing.     Past Medical History:  Diagnosis Date  . Aortic valve sclerosis   . Coronary atherosclerosis    without significant obstruction  . History of nuclear stress test 07/12/2010   bruce protocol myoview; normal pattern of perfusion, low risk, post-stress EF 92%  . Hyperlipidemia   . Hypertension     Past Surgical History:  Procedure Laterality Date  . ABDOMINAL HYSTERECTOMY     fibroids; ovaries intact.  . APPENDECTOMY    . CARDIAC CATHETERIZATION     moderate stenosis in 1st diagonal & RCA  . CHOLECYSTECTOMY    . TRANSTHORACIC ECHOCARDIOGRAM  07/12/2010   EF=>55% with normal systolic function; trace MR/TR/PR    Current  Medications: Outpatient Medications Prior to Visit  Medication Sig Dispense Refill  . albuterol (PROVENTIL HFA;VENTOLIN HFA) 108 (90 Base) MCG/ACT inhaler Inhale 2 puffs into the lungs every 4 (four) hours as needed for wheezing or shortness of breath (cough, shortness of breath or wheezing.). 1 Inhaler 1  . Ascorbic Acid (VITAMIN C PO) Take by mouth daily.    Marland Kitchen aspirin 81 MG tablet Take 160 mg by mouth daily.    Marland Kitchen atorvastatin (LIPITOR) 40 MG tablet Take 1 tablet (40 mg total) by mouth daily. MUST KEEP APPOINTMENT 02/02/16 WITH DR Andi Layfield FOR FUTURE REFILLS 30 tablet 0  . co-enzyme Q-10 30 MG capsule Take 100 mg by mouth 2 (two) times daily.    . fish oil-omega-3 fatty acids 1000 MG capsule Take 1 capsule by mouth 3 (three) times daily.    Marland Kitchen losartan-hydrochlorothiazide (HYZAAR) 50-12.5 MG tablet TAKE ONE TABLET BY MOUTH ONCE DAILY 90 tablet 3  . Multiple Vitamin (MULTIVITAMIN) tablet Take 1 tablet by mouth daily.    . Multiple Vitamins-Minerals (PRESERVISION AREDS 2 PO) Take by mouth 2 (two) times daily.    Marland Kitchen erythromycin ophthalmic ointment Place 1 application into the left eye at bedtime. (Patient not taking: Reported on 11/18/2015) 3.5 g 0   No facility-administered medications prior to visit.      Allergies:   Review of patient's allergies indicates no known allergies.   Social History   Social History  . Marital status: Married    Spouse name: N/A  . Number of children: 1  . Years of  education: 76   Social History Main Topics  . Smoking status: Never Smoker  . Smokeless tobacco: Never Used  . Alcohol use No  . Drug use: No  . Sexual activity: Yes   Other Topics Concern  . None   Social History Narrative   Marital status: widowed since 2014 due to COPD; married x 8  Years      Children: 1 daughter; 1 son died of CHF age 63 pacemaker.  4 grandchildren.      Lives: alone      Employment: retired at age 37 from [employer].      Tobacco:  never       Alcohol:  never       Exercise:  None in 2017; was walking 2 miles daily.  DDD lumbar limiting walking.        Family History:  The patient's family history includes Alzheimer's disease in her mother; Heart disease in her brother, brother, brother, paternal grandfather, and son; Heart disease (age of onset: 20) in her father.   ROS:   Please see the history of present illness.    ROS All other systems reviewed and are negative.   PHYSICAL EXAM:   VS:  BP 140/77   Pulse 73   Ht 5\' 1"  (1.549 m)   Wt 163 lb 9.6 oz (74.2 kg)   BMI 30.91 kg/m    GEN: Well nourished, well developed, in no acute distress  HEENT: normal  Neck: no JVD, carotid bruits, or masses Cardiac: RRR; 3/6 aortic ejection murmur radiating towards the neck, no diastolic murmurs, rubs, or gallops,no edema  Respiratory:  clear to auscultation bilaterally, normal work of breathing GI: soft, nontender, nondistended, + BS MS: no deformity or atrophy  Skin: warm and dry, no rash Neuro:  Alert and Oriented x 3, Strength and sensation are intact Psych: euthymic mood, full affect  Wt Readings from Last 3 Encounters:  02/02/16 163 lb 9.6 oz (74.2 kg)  11/18/15 164 lb (74.4 kg)  09/11/15 173 lb 6.4 oz (78.7 kg)      Studies/Labs Reviewed:   EKG:  EKG is ordered today.  The ekg ordered today demonstrates Sinus rhythm, poor R-wave progression with R-wave transition in V4-C5, QTC 420 ms  Recent Labs: 05/05/2015: TSH 2.462 11/18/2015: ALT 18; BUN 18; Creat 1.00; Hemoglobin 16.4; Platelets 316; Potassium 4.3; Sodium 141   Lipid Panel    Component Value Date/Time   CHOL 136 11/18/2015 1111   TRIG 143 11/18/2015 1111   HDL 43 (L) 11/18/2015 1111   CHOLHDL 3.2 11/18/2015 1111   VLDL 29 11/18/2015 1111   LDLCALC 64 11/18/2015 1111      ASSESSMENT:    1. Other chest pain   2. Atherosclerosis of native coronary artery of native heart without angina pectoris   3. Aortic valve sclerosis   4. Essential hypertension   5. Pure  hypercholesterolemia      PLAN:  In order of problems listed above:  1. Chest pain: Atypical, seems to be related only to emotional outbursts.  2. CAD: No high risk findings on ECG. Consider performing a treadmill stress test, but first evaluated for worsening valvular heart disease. 3. AV murmur: Recheck echocardiogram for progression to frank aortic stenosis 4. HTN: Fair control today and usually better at home 5. HLP: Extent LDL cholesterol current regimen, HDL is unlikely to improve without substantial weight loss and increase physical activity. She's actually lost about 10 lb in last couple of years.  Medication Adjustments/Labs and Tests Ordered: Current medicines are reviewed at length with the patient today.  Concerns regarding medicines are outlined above.  Medication changes, Labs and Tests ordered today are listed in the Patient Instructions below. Patient Instructions  Medication Instructions: Dr Sallyanne Kuster recommends that you continue on your current medications as directed. Please refer to the Current Medication list given to you today.  Labwork: NONE ORDERED  Testing/Procedures: 1. Echocardiogram - Your physician has requested that you have an echocardiogram. Echocardiography is a painless test that uses sound waves to create images of your heart. It provides your doctor with information about the size and shape of your heart and how well your heart's chambers and valves are working. This procedure takes approximately one hour. There are no restrictions for this procedure. This will be performed at our [facility] location - [address] [place] physician has requested that you have an exercise tolerance test. For further information please visit [email or web address]. Please also follow instruction sheet, as given.  Follow-up: Dr Sallyanne Kuster recommends that you schedule a follow-up appointment in 1 year. You will receive a reminder  letter in the mail two months in advance. If you don't receive a letter, please call our office to schedule the follow-up appointment.  If you need a refill on your cardiac medications before your next appointment, please call your pharmacy.    Signed, Sanda Klein, MD  02/06/2016 7:32 PM    [address], [place], [place]  [address] Phone: [phone]; Fax: [phone]

## 2016-02-02 NOTE — Patient Instructions (Signed)
Medication Instructions: Dr Sallyanne Kuster recommends that you continue on your current medications as directed. Please refer to the Current Medication list given to you today.  Labwork: NONE ORDERED  Testing/Procedures: 1. Echocardiogram - Your physician has requested that you have an echocardiogram. Echocardiography is a painless test that uses sound waves to create images of your heart. It provides your doctor with information about the size and shape of your heart and how well your heart's chambers and valves are working. This procedure takes approximately one hour. There are no restrictions for this procedure. This will be performed at our [facility] location - [address] [place] physician has requested that you have an exercise tolerance test. For further information please visit [email or web address]. Please also follow instruction sheet, as given.  Follow-up: Dr Sallyanne Kuster recommends that you schedule a follow-up appointment in 1 year. You will receive a reminder letter in the mail two months in advance. If you don't receive a letter, please call our office to schedule the follow-up appointment.  If you need a refill on your cardiac medications before your next appointment, please call your pharmacy.

## 2016-02-11 ENCOUNTER — Other Ambulatory Visit: Payer: Self-pay | Admitting: Cardiovascular Disease

## 2016-02-11 ENCOUNTER — Inpatient Hospital Stay (HOSPITAL_COMMUNITY): Admission: RE | Admit: 2016-02-11 | Payer: Medicare Other | Source: Ambulatory Visit

## 2016-02-11 DIAGNOSIS — R072 Precordial pain: Secondary | ICD-10-CM

## 2016-02-14 ENCOUNTER — Other Ambulatory Visit: Payer: Self-pay | Admitting: Cardiovascular Disease

## 2016-02-16 ENCOUNTER — Ambulatory Visit (HOSPITAL_COMMUNITY)
Admission: RE | Admit: 2016-02-16 | Discharge: 2016-02-16 | Disposition: A | Discharge status: Home / Self Care | Payer: Medicare Other | Source: Ambulatory Visit | Attending: Cardiovascular Disease | Admitting: Cardiovascular Disease

## 2016-02-16 DIAGNOSIS — R072 Precordial pain: Secondary | ICD-10-CM | POA: Diagnosis not present

## 2016-02-16 LAB — MYOCARDIAL PERFUSION IMAGING
CHL CUP NUCLEAR SDS: 6
CHL CUP NUCLEAR SRS: 3
CHL CUP NUCLEAR SSS: 9
CHL CUP RESTING HR STRESS: 59 {beats}/min
LV dias vol: 45 mL (ref 46–106)
LV sys vol: 13 mL
NUC STRESS TID: 1.2
Peak HR: 89 {beats}/min

## 2016-02-16 MED ORDER — TECHNETIUM TC 99M TETROFOSMIN IV KIT
10.1000 | PACK | Freq: Once | INTRAVENOUS | Status: AC | PRN
  Administered 2016-02-16: 10.1 via INTRAVENOUS
  Filled 2016-02-16: qty 11

## 2016-02-16 MED ORDER — TECHNETIUM TC 99M TETROFOSMIN IV KIT
29.8000 | PACK | Freq: Once | INTRAVENOUS | Status: AC | PRN
  Administered 2016-02-16: 29.8 via INTRAVENOUS
  Filled 2016-02-16: qty 30

## 2016-02-16 MED ORDER — REGADENOSON 0.4 MG/5ML IV SOLN
0.4000 mg | Freq: Once | INTRAVENOUS | Status: AC
  Administered 2016-02-16: 0.4 mg via INTRAVENOUS

## 2016-02-17 ENCOUNTER — Other Ambulatory Visit: Payer: Self-pay | Admitting: Family Medicine

## 2016-02-17 ENCOUNTER — Encounter: Payer: Self-pay | Admitting: Family Medicine

## 2016-02-17 DIAGNOSIS — R921 Mammographic calcification found on diagnostic imaging of breast: Secondary | ICD-10-CM

## 2016-02-22 ENCOUNTER — Other Ambulatory Visit: Payer: Self-pay

## 2016-02-22 ENCOUNTER — Ambulatory Visit (HOSPITAL_COMMUNITY): Payer: Medicare Other | Attending: Internal Medicine

## 2016-02-22 DIAGNOSIS — I352 Nonrheumatic aortic (valve) stenosis with insufficiency: Secondary | ICD-10-CM | POA: Insufficient documentation

## 2016-02-22 DIAGNOSIS — I358 Other nonrheumatic aortic valve disorders: Secondary | ICD-10-CM | POA: Diagnosis not present

## 2016-02-22 DIAGNOSIS — I517 Cardiomegaly: Secondary | ICD-10-CM | POA: Insufficient documentation

## 2016-03-11 ENCOUNTER — Ambulatory Visit
Admission: RE | Admit: 2016-03-11 | Discharge: 2016-03-11 | Disposition: A | Discharge status: Home / Self Care | Payer: Medicare Other | Source: Ambulatory Visit | Attending: Family Medicine | Admitting: Family Medicine

## 2016-03-11 DIAGNOSIS — R921 Mammographic calcification found on diagnostic imaging of breast: Secondary | ICD-10-CM | POA: Diagnosis not present

## 2016-03-18 ENCOUNTER — Other Ambulatory Visit: Payer: Self-pay | Admitting: Cardiovascular Disease

## 2016-03-18 NOTE — Telephone Encounter (Signed)
Rx(s) sent to pharmacy electronically.  

## 2016-05-12 ENCOUNTER — Ambulatory Visit (INDEPENDENT_AMBULATORY_CARE_PROVIDER_SITE_OTHER): Payer: Medicare Other | Admitting: Emergency Medicine

## 2016-05-12 ENCOUNTER — Encounter: Payer: Self-pay | Admitting: Emergency Medicine

## 2016-05-12 VITALS — BP 160/70 | HR 90 | Temp 98.2°F | Resp 16 | Ht 61.0 in | Wt 165.4 lb

## 2016-05-12 DIAGNOSIS — J209 Acute bronchitis, unspecified: Secondary | ICD-10-CM | POA: Diagnosis not present

## 2016-05-12 DIAGNOSIS — R05 Cough: Secondary | ICD-10-CM | POA: Diagnosis not present

## 2016-05-12 DIAGNOSIS — R059 Cough, unspecified: Secondary | ICD-10-CM

## 2016-05-12 MED ORDER — ALBUTEROL SULFATE HFA 108 (90 BASE) MCG/ACT IN AERS: 2.0000 | INHALATION_SPRAY | RESPIRATORY_TRACT | 1 refills | Status: DC | PRN

## 2016-05-12 MED ORDER — AMOXICILLIN-POT CLAVULANATE 875-125 MG PO TABS: 1.0000 | ORAL_TABLET | Freq: Two times a day (BID) | ORAL | 0 refills | Status: AC

## 2016-05-12 MED ORDER — PREDNISONE 20 MG PO TABS: 40.0000 mg | ORAL_TABLET | Freq: Every day | ORAL | 0 refills | Status: AC

## 2016-05-12 MED ORDER — PROMETHAZINE-CODEINE 6.25-10 MG/5ML PO SYRP: 5.0000 mL | ORAL_SOLUTION | Freq: Every evening | ORAL | 0 refills | Status: DC | PRN

## 2016-05-12 NOTE — Progress Notes (Signed)
Sierra Carpenter 70 y.o.   Chief Complaint  Patient presents with  . Cough    headache, chills, body aches, feels tired    HISTORY OF PRESENT ILLNESS: This is a 71 y.o. female complaining of flu symptoms since last Sunday, 4 days ago.  Cough  This is a new problem. The current episode started in the past 7 days. The problem has been gradually worsening. The problem occurs every few minutes. The cough is productive of sputum. Associated symptoms include chills, a fever, myalgias, nasal congestion, postnasal drip, rhinorrhea and wheezing. Pertinent negatives include no chest pain, ear pain, eye redness, headaches, rash, sore throat or shortness of breath. Nothing aggravates the symptoms. Risk factors for lung disease include smoking/tobacco exposure (late husband). She has tried OTC cough suppressant for the symptoms. The treatment provided mild relief.     Prior to Admission medications   Medication Sig Start Date End Date Taking? Authorizing Provider  albuterol (PROVENTIL HFA;VENTOLIN HFA) 108 (90 Base) MCG/ACT inhaler Inhale 2 puffs into the lungs every 4 (four) hours as needed for wheezing or shortness of breath (cough, shortness of breath or wheezing.). 05/12/16  Yes Horald Pollen, MD  Ascorbic Acid (VITAMIN C PO) Take by mouth daily.   Yes Historical Provider, MD  aspirin 81 MG tablet Take 160 mg by mouth daily.   Yes Historical Provider, MD  atorvastatin (LIPITOR) 40 MG tablet Take 1 tablet (40 mg total) by mouth daily. 03/18/16  Yes Mihai Croitoru, MD  Calcium Carb-Cholecalciferol (CALCIUM 600+D3) 600-200 MG-UNIT TABS Take by mouth 2 (two) times daily.   Yes Historical Provider, MD  co-enzyme Q-10 30 MG capsule Take 100 mg by mouth 2 (two) times daily.   Yes Historical Provider, MD  fish oil-omega-3 fatty acids 1000 MG capsule Take 1 capsule by mouth 3 (three) times daily.   Yes Historical Provider, MD  losartan-hydrochlorothiazide (HYZAAR) 50-12.5 MG tablet TAKE ONE TABLET BY  MOUTH ONCE DAILY 12/31/15  Yes Mihai Croitoru, MD  Multiple Vitamin (MULTIVITAMIN) tablet Take 1 tablet by mouth daily.   Yes Historical Provider, MD  Multiple Vitamins-Minerals (PRESERVISION AREDS 2 PO) Take by mouth 2 (two) times daily.   Yes Historical Provider, MD  amoxicillin-clavulanate (AUGMENTIN) 875-125 MG tablet Take 1 tablet by mouth 2 (two) times daily. 05/12/16 05/19/16  Horald Pollen, MD  predniSONE (DELTASONE) 20 MG tablet Take 2 tablets (40 mg total) by mouth daily with breakfast. 05/12/16 05/17/16  Horald Pollen, MD  promethazine-codeine [facility] WITH CODEINE) 6.25-10 MG/5ML syrup Take 5 mLs by mouth at bedtime as needed for cough. 05/12/16   Horald Pollen, MD    No Known Allergies  Patient Active Problem List   Diagnosis Date Noted  . Acute bronchitis 05/12/2016  . Aortic valve sclerosis 08/28/2015  . Polycythemia 06/01/2015  . Coronary atherosclerosis 01/01/2013  . Hypertension 05/09/2011  . Hyperlipidemia 05/09/2011  . Colon polyps 05/09/2011    Past Medical History:  Diagnosis Date  . Aortic valve sclerosis   . Coronary atherosclerosis    without significant obstruction  . History of nuclear stress test 07/12/2010   bruce protocol myoview; normal pattern of perfusion, low risk, post-stress EF 92%  . Hyperlipidemia   . Hypertension     Past Surgical History:  Procedure Laterality Date  . ABDOMINAL HYSTERECTOMY     fibroids; ovaries intact.  . APPENDECTOMY    . CARDIAC CATHETERIZATION     moderate stenosis in 1st diagonal & RCA  . CHOLECYSTECTOMY    .  TRANSTHORACIC ECHOCARDIOGRAM  07/12/2010   EF=>55% with normal systolic function; trace MR/TR/PR    Social History   Social History  . Marital status: Married    Spouse name: N/A  . Number of children: 1  . Years of education: 84   Occupational History  . Not on file.   Social History Main Topics  . Smoking status: Never Smoker  . Smokeless tobacco: Never Used  . Alcohol use No  .  Drug use: No  . Sexual activity: Yes   Other Topics Concern  . Not on file   Social History Narrative   Marital status: widowed since 2014 due to COPD; married x 76  Years      Children: 1 daughter; 1 son died of CHF age 54 pacemaker.  4 grandchildren.      Lives: alone      Employment: retired at age 13 from [employer].      Tobacco:  never       Alcohol:  never      Exercise:  None in 2017; was walking 2 miles daily.  DDD lumbar limiting walking.       Family History  Problem Relation Age of Onset  . Heart disease Father 60    AMI  . Heart disease Brother   . Heart disease Son   . Heart disease Paternal Grandfather   . Alzheimer's disease Mother   . Heart disease Brother   . Heart disease Brother      Review of Systems  Constitutional: Positive for chills and fever.  HENT: Positive for congestion, postnasal drip and rhinorrhea. Negative for ear pain, nosebleeds, sinus pain and sore throat.   Eyes: Negative for discharge and redness.  Respiratory: Positive for cough, sputum production and wheezing. Negative for shortness of breath.   Cardiovascular: Negative for chest pain and palpitations.  Gastrointestinal: Negative for abdominal pain, diarrhea, nausea and vomiting.  Genitourinary: Negative for dysuria and hematuria.  Musculoskeletal: Positive for myalgias.  Skin: Negative for rash.  Neurological: Negative for dizziness, sensory change, focal weakness and headaches.  All other systems reviewed and are negative.  Vitals:   05/12/16 0842  BP: (!) 160/70  Pulse: 90  Resp: 16  Temp: 98.2 F (36.8 C)     Physical Exam  Constitutional: She is oriented to person, place, and time. She appears well-developed and well-nourished.  HENT:  Head: Normocephalic and atraumatic.  Right Ear: Hearing, tympanic membrane, external ear and ear canal normal.  Left Ear: Hearing, tympanic membrane, external ear and ear canal normal.  Mouth/Throat: Posterior oropharyngeal  erythema present. No oropharyngeal exudate or tonsillar abscesses.  Eyes: Conjunctivae and EOM are normal. Pupils are equal, round, and reactive to light.  Neck: Normal range of motion. Neck supple. No JVD present. No thyromegaly present.  Cardiovascular: Normal rate, regular rhythm and normal heart sounds.   Pulmonary/Chest: Effort normal. She has wheezes (diffuse bilaterally). She has no rales.  Abdominal: Soft. She exhibits no distension. There is no tenderness.  Musculoskeletal: Normal range of motion.  Lymphadenopathy:    She has no cervical adenopathy.  Neurological: She is alert and oriented to person, place, and time. No sensory deficit. She exhibits normal muscle tone.  Skin: Skin is warm and dry. Capillary refill takes less than 2 seconds.  Psychiatric: She has a normal mood and affect. Her behavior is normal.  Vitals reviewed.    ASSESSMENT & PLAN: Sierra Carpenter was seen today for cough.  Diagnoses and all orders for  this visit:  Acute bronchitis, unspecified organism  Cough -     albuterol (PROVENTIL HFA;VENTOLIN HFA) 108 (90 Base) MCG/ACT inhaler; Inhale 2 puffs into the lungs every 4 (four) hours as needed for wheezing or shortness of breath (cough, shortness of breath or wheezing.).  Bronchospasm with bronchitis, acute  Other orders -     predniSONE (DELTASONE) 20 MG tablet; Take 2 tablets (40 mg total) by mouth daily with breakfast. -     promethazine-codeine (PHENERGAN WITH CODEINE) 6.25-10 MG/5ML syrup; Take 5 mLs by mouth at bedtime as needed for cough. -     amoxicillin-clavulanate (AUGMENTIN) 875-125 MG tablet; Take 1 tablet by mouth 2 (two) times daily.    Patient Instructions       IF you received an x-ray today, you will receive an invoice from [facility]. Please contact [facility] at [phone] with questions or concerns regarding your invoice.   IF you received labwork today, you will receive an invoice from [facility]. Please contact  [facility] at [phone] with questions or concerns regarding your invoice.   Our billing staff will not be able to assist you with questions regarding bills from these companies.  You will be contacted with the lab results as soon as they are available. The fastest way to get your results is to activate your My Chart account. Instructions are located on the last page of this paperwork. If you have not heard from [place] regarding the results in 2 weeks, please contact this office.      Acute Bronchitis, Adult Acute bronchitis is when air tubes (bronchi) in the lungs suddenly get swollen. The condition can make it hard to breathe. It can also cause these symptoms:  A cough.  Coughing up clear, yellow, or green mucus.  Wheezing.  Chest congestion.  Shortness of breath.  A fever.  Body aches.  Chills.  A sore throat. Follow these instructions at home: Medicines  Take over-the-counter and prescription medicines only as told by your doctor.  If you were prescribed an antibiotic medicine, take it as told by your doctor. Do not stop taking the antibiotic even if you start to feel better. General instructions  Rest.  Drink enough fluids to keep your pee (urine) clear or pale yellow.  Avoid smoking and secondhand smoke. If you smoke and you need help quitting, ask your doctor. Quitting will help your lungs heal faster.  Use an inhaler, cool mist vaporizer, or humidifier as told by your doctor.  Keep all follow-up visits as told by your doctor. This is important. How is this prevented? To lower your risk of getting this condition again:  Wash your hands often with soap and water. If you cannot use soap and water, use hand sanitizer.  Avoid contact with people who have cold symptoms.  Try not to touch your hands to your mouth, nose, or eyes.  Make sure to get the flu shot every year. Contact a doctor if:  Your symptoms do not get better in 2 weeks. Get help right away  if:  You cough up blood.  You have chest pain.  You have very bad shortness of breath.  You become dehydrated.  You faint (pass out) or keep feeling like you are going to pass out.  You keep throwing up (vomiting).  You have a very bad headache.  Your fever or chills gets worse. This information is not intended to replace advice given to you by your health care provider. Make sure you discuss  any questions you have with your health care provider. Document Released: 09/07/2007 Document Revised: 10/28/2015 Document Reviewed: 09/09/2015 Elsevier Interactive Patient Education  2017 Elsevier Inc.      Agustina Caroli, MD [facility]

## 2016-05-12 NOTE — Patient Instructions (Addendum)
     IF you received an x-ray today, you will receive an invoice from [facility]. Please contact [facility] at [phone] with questions or concerns regarding your invoice.   IF you received labwork today, you will receive an invoice from [facility]. Please contact [facility] at [phone] with questions or concerns regarding your invoice.   Our billing staff will not be able to assist you with questions regarding bills from these companies.  You will be contacted with the lab results as soon as they are available. The fastest way to get your results is to activate your My Chart account. Instructions are located on the last page of this paperwork. If you have not heard from us regarding the results in 2 weeks, please contact this office.      Acute Bronchitis, Adult Acute bronchitis is when air tubes (bronchi) in the lungs suddenly get swollen. The condition can make it hard to breathe. It can also cause these symptoms:  A cough.  Coughing up clear, yellow, or green mucus.  Wheezing.  Chest congestion.  Shortness of breath.  A fever.  Body aches.  Chills.  A sore throat.  Follow these instructions at home: Medicines  Take over-the-counter and prescription medicines only as told by your doctor.  If you were prescribed an antibiotic medicine, take it as told by your doctor. Do not stop taking the antibiotic even if you start to feel better. General instructions  Rest.  Drink enough fluids to keep your pee (urine) clear or pale yellow.  Avoid smoking and secondhand smoke. If you smoke and you need help quitting, ask your doctor. Quitting will help your lungs heal faster.  Use an inhaler, cool mist vaporizer, or humidifier as told by your doctor.  Keep all follow-up visits as told by your doctor. This is important. How is this prevented? To lower your risk of getting this condition again:  Wash your hands often with soap and water. If you cannot  use soap and water, use hand sanitizer.  Avoid contact with people who have cold symptoms.  Try not to touch your hands to your mouth, nose, or eyes.  Make sure to get the flu shot every year.  Contact a doctor if:  Your symptoms do not get better in 2 weeks. Get help right away if:  You cough up blood.  You have chest pain.  You have very bad shortness of breath.  You become dehydrated.  You faint (pass out) or keep feeling like you are going to pass out.  You keep throwing up (vomiting).  You have a very bad headache.  Your fever or chills gets worse. This information is not intended to replace advice given to you by your health care provider. Make sure you discuss any questions you have with your health care provider. Document Released: 09/07/2007 Document Revised: 10/28/2015 Document Reviewed: 09/09/2015 Elsevier Interactive Patient Education  2017 Elsevier Inc.  

## 2016-05-24 ENCOUNTER — Encounter: Payer: Medicare Other | Admitting: Family Medicine

## 2016-06-01 ENCOUNTER — Ambulatory Visit (INDEPENDENT_AMBULATORY_CARE_PROVIDER_SITE_OTHER): Payer: Medicare Other | Admitting: Family Medicine

## 2016-06-01 ENCOUNTER — Encounter: Payer: Self-pay | Admitting: Family Medicine

## 2016-06-01 VITALS — BP 135/78 | HR 84 | Temp 98.3°F | Resp 18 | Ht 61.0 in | Wt 167.2 lb

## 2016-06-01 DIAGNOSIS — I2510 Atherosclerotic heart disease of native coronary artery without angina pectoris: Secondary | ICD-10-CM | POA: Diagnosis not present

## 2016-06-01 DIAGNOSIS — Z6831 Body mass index (BMI) 31.0-31.9, adult: Secondary | ICD-10-CM | POA: Diagnosis not present

## 2016-06-01 DIAGNOSIS — Z0000 Encounter for general adult medical examination without abnormal findings: Secondary | ICD-10-CM

## 2016-06-01 DIAGNOSIS — E6609 Other obesity due to excess calories: Secondary | ICD-10-CM

## 2016-06-01 DIAGNOSIS — D751 Secondary polycythemia: Secondary | ICD-10-CM | POA: Diagnosis not present

## 2016-06-01 DIAGNOSIS — E7800 Pure hypercholesterolemia, unspecified: Secondary | ICD-10-CM | POA: Diagnosis not present

## 2016-06-01 DIAGNOSIS — I358 Other nonrheumatic aortic valve disorders: Secondary | ICD-10-CM

## 2016-06-01 DIAGNOSIS — Z23 Encounter for immunization: Secondary | ICD-10-CM

## 2016-06-01 DIAGNOSIS — I10 Essential (primary) hypertension: Secondary | ICD-10-CM

## 2016-06-01 MED ORDER — LOSARTAN POTASSIUM-HCTZ 50-12.5 MG PO TABS: 1.0000 | ORAL_TABLET | Freq: Every day | ORAL | 3 refills | Status: DC

## 2016-06-01 MED ORDER — ATORVASTATIN CALCIUM 40 MG PO TABS: 40.0000 mg | ORAL_TABLET | Freq: Every day | ORAL | 3 refills | Status: DC

## 2016-06-01 MED ORDER — ZOSTER VAC RECOMB ADJUVANTED 50 MCG/0.5ML IM SUSR: 1.0000 | Freq: Once | INTRAMUSCULAR | 1 refills | Status: AC

## 2016-06-01 NOTE — Progress Notes (Signed)
Subjective:    Patient ID: Sierra Carpenter, female    DOB: 1945/11/19, 71 y.o.   MRN: [id number]  06/01/2016  Annual Exam   HPI This 71 y.o. female presents for annual wellness examination and chronic medical follow-up.   Last physical: 05-05-15 Pap smear: n/a age. hysterectomy Mammogram: 03-11-16 Colonoscopy:  2017 [facility].  Repeat in five years. Bone density: 12-24-2015 Eye exam: Dental exam:   Lost brother and uncle since last visit.  Feels like falling apart.  Christmas was rough.  Had also lost mother.  Now having to redefine role.    Lower back pain: chronic lower back pain; s/p lumbar spine films.  Struggles in the morning.  If receives jury duty notice, would like to be written out.  Referred to ortho; has exercises.  Not currently performing exercises.  Does had treadmill.  Can go 10 minutes on treadmill.  Also has cardio biycycle.  Cough: upper airways; scant intermittent sputum; clear sputum.  Prednisone, abx.  HTN: Patient reports good compliance with medication, good tolerance to medication, and good symptom control.    Hypercholesterolemia: Patient reports good compliance with medication, good tolerance to medication, and good symptom control.     Immunization History  Administered Date(s) Administered  . Influenza Split 12/08/2012  . Influenza,inj,Quad PF,36+ Mos 12/18/2014  . Influenza-Unspecified 12/10/2013, 11/30/2015, 06/01/2016  . Pneumococcal Conjugate-13 12/10/2013  . Pneumococcal Polysaccharide-23 12/18/2014  . Tdap 08/07/2010   BP Readings from Last 3 Encounters:  06/06/16 138/70  06/01/16 135/78  05/12/16 (!) 160/70   Wt Readings from Last 3 Encounters:  06/06/16 167 lb 9.6 oz (76 kg)  06/01/16 167 lb 3.2 oz (75.8 kg)  05/12/16 165 lb 6.4 oz (75 kg)     Review of Systems  Constitutional: Negative for activity change, appetite change, chills, diaphoresis, fatigue, fever and unexpected weight change.  HENT: Negative for congestion, dental  problem, drooling, ear discharge, ear pain, facial swelling, hearing loss, mouth sores, nosebleeds, postnasal drip, rhinorrhea, sinus pressure, sneezing, sore throat, tinnitus, trouble swallowing and voice change.   Eyes: Negative for photophobia, pain, discharge, redness, itching and visual disturbance.  Respiratory: Negative for apnea, cough, choking, chest tightness, shortness of breath, wheezing and stridor.   Cardiovascular: Negative for chest pain, palpitations and leg swelling.  Gastrointestinal: Negative for abdominal distention, abdominal pain, anal bleeding, blood in stool, constipation, diarrhea, nausea, rectal pain and vomiting.  Endocrine: Negative for cold intolerance, heat intolerance, polydipsia, polyphagia and polyuria.  Genitourinary: Negative for decreased urine volume, difficulty urinating, dyspareunia, dysuria, enuresis, flank pain, frequency, genital sores, hematuria, menstrual problem, pelvic pain, urgency, vaginal bleeding, vaginal discharge and vaginal pain.  Musculoskeletal: Negative for arthralgias, back pain, gait problem, joint swelling, myalgias, neck pain and neck stiffness.  Skin: Negative for color change, pallor, rash and wound.  Allergic/Immunologic: Negative for environmental allergies, food allergies and immunocompromised state.  Neurological: Negative for dizziness, tremors, seizures, syncope, facial asymmetry, speech difficulty, weakness, light-headedness, numbness and headaches.  Hematological: Negative for adenopathy. Does not bruise/bleed easily.  Psychiatric/Behavioral: Negative for agitation, behavioral problems, confusion, decreased concentration, dysphoric mood, hallucinations, self-injury, sleep disturbance and suicidal ideas. The patient is not nervous/anxious and is not hyperactive.     Past Medical History:  Diagnosis Date  . Aortic valve sclerosis   . Coronary atherosclerosis    without significant obstruction  . Heart murmur   . History of  nuclear stress test 07/12/2010   bruce protocol myoview; normal pattern of perfusion, low risk, post-stress EF 92%  .  Hyperlipidemia   . Hypertension    Past Surgical History:  Procedure Laterality Date  . ABDOMINAL HYSTERECTOMY     fibroids; ovaries intact.  . APPENDECTOMY    . CARDIAC CATHETERIZATION     moderate stenosis in 1st diagonal & RCA  . CESAREAN SECTION    . CHOLECYSTECTOMY    . TRANSTHORACIC ECHOCARDIOGRAM  07/12/2010   EF=>55% with normal systolic function; trace MR/TR/PR   No Known Allergies  Social History   Social History  . Marital status: Married    Spouse name: N/A  . Number of children: 1  . Years of education: 28   Occupational History  . 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 Smoking status: Never Smoker  . Smokeless tobacco: Never Used  . Alcohol use No  . Drug use: No  . Sexual activity: Yes   Other Topics Concern  . Not on file   Social History Narrative   Marital status: widowed since 2014 due to COPD; married x 37 years.      Children: 1 daughter; 1 son died of CHF age 72 pacemaker.  4 grandchildren.      Lives: alone      Employment: retired at age 20 from [employer].      Tobacco:  never       Alcohol:  never      Exercise:  None in 2017; was walking 2 miles daily.  DDD lumbar limiting walking.      ADLs: independent with ADLs; drives.        Advanced Directives: none; has paper.  FULL CODE; no prolonged measures.  HCPOA: daughter/Misty Wenn-Maness.      Family History  Problem Relation Age of Onset  . Heart disease Father 56    AMI  . Heart disease Brother   . Heart disease Son   . Heart disease Paternal Grandfather   . Alzheimer's disease Mother   . Heart disease Brother   . COPD Brother   . Heart disease Brother        Objective:    BP 135/78   Pulse 84   Temp 98.3 F (36.8 C) (Oral)   Resp 18   Ht 5\' 1"  (1.549 m)   Wt 167 lb 3.2 oz (75.8 kg)   SpO2 96%   BMI 31.59 kg/m  Physical  Exam  Constitutional: She is oriented to person, place, and time. She appears well-developed and well-nourished. No distress.  HENT:  Head: Normocephalic and atraumatic.  Right Ear: External ear normal.  Left Ear: External ear normal.  Nose: Nose normal.  Mouth/Throat: Oropharynx is clear and moist.  Eyes: Conjunctivae and EOM are normal. Pupils are equal, round, and reactive to light.  Neck: Normal range of motion and full passive range of motion without pain. Neck supple. No JVD present. Carotid bruit is not present. No thyromegaly present.  Cardiovascular: Normal rate, regular rhythm and normal heart sounds.  Exam reveals no gallop and no friction rub.   No murmur heard. Pulmonary/Chest: Effort normal and breath sounds normal. She has no wheezes. She has no rales. Right breast exhibits no inverted nipple, no mass, no nipple discharge, no skin change and no tenderness. Left breast exhibits no inverted nipple, no mass, no nipple discharge, no skin change and no tenderness. Breasts are symmetrical.  Abdominal: Soft. Bowel sounds are normal. She exhibits no distension and no mass. There is no tenderness. There is no rebound  and no guarding.  Musculoskeletal:       Right shoulder: Normal.       Left shoulder: Normal.       Cervical back: Normal.  Lymphadenopathy:    She has no cervical adenopathy.  Neurological: She is alert and oriented to person, place, and time. She has normal reflexes. No cranial nerve deficit. She exhibits normal muscle tone. Coordination normal.  Skin: Skin is warm and dry. No rash noted. She is not diaphoretic. No erythema. No pallor.  Psychiatric: She has a normal mood and affect. Her behavior is normal. Judgment and thought content normal.  Nursing note and vitals reviewed.  Depression screen [facility] 2/9 06/06/2016 06/01/2016 05/12/2016 11/18/2015 09/11/2015  Decreased Interest 0 0 0 0 0  Down, Depressed, Hopeless 0 0 0 0 0  PHQ - 2 Score 0 0 0 0 0   Fall Risk  06/06/2016  06/01/2016 05/12/2016 11/18/2015 09/11/2015  Falls in the past year? No No No No No   Functional Status Survey: Is the patient deaf or have difficulty hearing?: Yes Does the patient have difficulty seeing, even when wearing glasses/contacts?: No Does the patient have difficulty concentrating, remembering, or making decisions?: No Does the patient have difficulty walking or climbing stairs?: No Does the patient have difficulty dressing or bathing?: No Does the patient have difficulty doing errands alone such as visiting a doctor's office or shopping?: No      Assessment & Plan:   1. Annual physical exam   2. Essential hypertension, benign   3. Pure hypercholesterolemia   4. Polycythemia   5. Atherosclerosis of native coronary artery of native heart without angina pectoris   6. Aortic valve sclerosis   7. Class 1 obesity due to excess calories with serious comorbidity and body mass index (BMI) of 31.0 to 31.9 in adult   8. Need for shingles vaccine    -anticipatory guidance provided. -obtain labs for chronic disease management; refills provided. -rx for Shingrix provided.   Orders Placed This Encounter  Procedures  . CBC with Differential/Platelet  . Comprehensive metabolic panel    Order Specific Question:   Has the patient fasted?    Answer:   Yes  . Lipid panel    Order Specific Question:   Has the patient fasted?    Answer:   Yes  . POCT urinalysis dipstick   Meds ordered this encounter  Medications  . Zoster Vac Recomb Adjuvanted (SHINGRIX) 50 MCG SUSR    Sig: Inject 1 Dose into the muscle once.    Dispense:  1 each    Refill:  1  . losartan-hydrochlorothiazide (HYZAAR) 50-12.5 MG tablet    Sig: Take 1 tablet by mouth daily.    Dispense:  90 tablet    Refill:  3  . atorvastatin (LIPITOR) 40 MG tablet    Sig: Take 1 tablet (40 mg total) by mouth daily.    Dispense:  90 tablet    Refill:  3    Please consider 90 day supplies to promote better adherence    Return in  about 6 months (around 11/29/2016) for recheck blood pressure and high cholesterol.   Jarmon Javid Elayne Guerin, M.D. Primary [facility] at [facility] previously [facility] [address] [place], [place]  [phone] phone [phone] fax

## 2016-06-01 NOTE — Patient Instructions (Addendum)
IF you received an x-ray today, you will receive an invoice from [facility]. Please contact [facility] at [phone] with questions or concerns regarding your invoice.   IF you received labwork today, you will receive an invoice from [place]. Please contact [facility] at [phone] with questions or concerns regarding your invoice.   Our billing staff will not be able to assist you with questions regarding bills from these companies.  You will be contacted with the lab results as soon as they are available. The fastest way to get your results is to activate your My Chart account. Instructions are located on the last page of this paperwork. If you have not heard from [place] regarding the results in 2 weeks, please contact this office.     cKeeping You Healthy  Get These Tests  Blood Pressure- Have your blood pressure checked by your healthcare provider at least once a year.  Normal blood pressure is 120/80.  Weight- Have your body mass index (BMI) calculated to screen for obesity.  BMI is a measure of body fat based on height and weight.  You can calculate your own BMI at [employer].it  Cholesterol- Have your cholesterol checked every year.  Diabetes- Have your blood sugar checked every year if you have high blood pressure, high cholesterol, a family history of diabetes or if you are overweight.  Pap Test - Have a pap test every 1 to 5 years if you have been sexually active.  If you are older than 65 and recent pap tests have been normal you may not need additional pap tests.  In addition, if you have had a hysterectomy  for benign disease additional pap tests are not necessary.  Mammogram-Yearly mammograms are essential for early detection of breast cancer  Screening for Colon Cancer- Colonoscopy starting at age 52. Screening may begin sooner depending on your family history and other health conditions.  Follow up colonoscopy as directed by your  Gastroenterologist.  Screening for Osteoporosis- Screening begins at age 78 with bone density scanning, sooner if you are at higher risk for developing Osteoporosis.  Get these medicines  Calcium with Vitamin D- Your body requires 1200-1500 mg of Calcium a day and [id number] IU of Vitamin D a day.  You can only absorb 500 mg of Calcium at a time therefore Calcium must be taken in 2 or 3 separate doses throughout the day.  Hormones- Hormone therapy has been associated with increased risk for certain cancers and heart disease.  Talk to your healthcare provider about if you need relief from menopausal symptoms.  Aspirin- Ask your healthcare provider about taking Aspirin to prevent Heart Disease and Stroke.  Get these Immuniztions  Flu shot- Every fall  Pneumonia shot- Once after the age of 22; if you are younger ask your healthcare provider if you need a pneumonia shot.  Tetanus- Every ten years.  Zostavax- Once after the age of 62 to prevent shingles.  Take these steps  Don't smoke- Your healthcare provider can help you quit. For tips on how to quit, ask your healthcare provider or go to www.smokefree.gov or call 1-800 QUIT-NOW.  Be physically active- Exercise 5 days a week for a minimum of 30 minutes.  If you are not already physically active, start slow and gradually work up to 30 minutes of moderate physical activity.  Try walking, dancing, bike riding, swimming, etc.  Eat a healthy diet- Eat a variety of healthy foods such as fruits, vegetables, whole grains, low  milk, low fat cheeses, yogurt, lean meats, chicken, fish, eggs, dried beans, tofu, etc.  For more information go to www.thenutritionsource.org  Dental visit- Brush and floss teeth twice daily; visit your dentist twice a year.  Eye exam- Visit your Optometrist or Ophthalmologist yearly.  Drink alcohol in moderation- Limit alcohol intake to one drink or less a day.  Never drink and drive.  Depression- Your emotional  health is as important as your physical health.  If you're feeling down or losing interest in things you normally enjoy, please talk to your healthcare provider.  Seat Belts- can save your life; always wear one  Smoke/Carbon Monoxide detectors- These detectors need to be installed on the appropriate level of your home.  Replace batteries at least once a year.  Violence- If anyone is threatening or hurting you, please tell your healthcare provider. Living Will/ Health care power of attorney- Discuss with your healthcare provider and family. 

## 2016-06-02 LAB — COMPREHENSIVE METABOLIC PANEL
A/G RATIO: 1.8 (ref 1.2–2.2)
ALK PHOS: 337 IU/L — AB (ref 39–117)
ALT: 72 IU/L — ABNORMAL HIGH (ref 0–32)
AST: 46 IU/L — AB (ref 0–40)
Albumin: 4.4 g/dL (ref 3.6–4.8)
BUN/Creatinine Ratio: 15 (ref 12–28)
BUN: 14 mg/dL (ref 8–27)
Bilirubin Total: 0.5 mg/dL (ref 0.0–1.2)
CALCIUM: 10.9 mg/dL — AB (ref 8.7–10.3)
CO2: 27 mmol/L (ref 18–29)
Chloride: 100 mmol/L (ref 96–106)
Creatinine, Ser: 0.94 mg/dL (ref 0.57–1.00)
GFR calc Af Amer: 72 mL/min/{1.73_m2} (ref >59)
GFR, EST NON AFRICAN AMERICAN: 63 mL/min/{1.73_m2} (ref >59)
Globulin, Total: 2.4 g/dL (ref 1.5–4.5)
Glucose: 91 mg/dL (ref 65–99)
POTASSIUM: 4.5 mmol/L (ref 3.5–5.2)
SODIUM: 146 mmol/L — AB (ref 134–144)
Total Protein: 6.8 g/dL (ref 6.0–8.5)

## 2016-06-02 LAB — CBC WITH DIFFERENTIAL/PLATELET
BASOS: 1 %
Basophils Absolute: 0.1 10*3/uL (ref 0.0–0.2)
EOS (ABSOLUTE): 0.8 10*3/uL — ABNORMAL HIGH (ref 0.0–0.4)
EOS: 8 %
HEMATOCRIT: 44.6 % (ref 34.0–46.6)
Hemoglobin: 15.6 g/dL (ref 11.1–15.9)
Immature Grans (Abs): 0.1 10*3/uL (ref 0.0–0.1)
Immature Granulocytes: 1 %
LYMPHS ABS: 2.1 10*3/uL (ref 0.7–3.1)
Lymphs: 21 %
MCH: 32.9 pg (ref 26.6–33.0)
MCHC: 35 g/dL (ref 31.5–35.7)
MCV: 94 fL (ref 79–97)
MONOS ABS: 0.8 10*3/uL (ref 0.1–0.9)
Monocytes: 7 %
NEUTROS ABS: 6.5 10*3/uL (ref 1.4–7.0)
Neutrophils: 62 %
Platelets: 332 10*3/uL (ref 150–379)
RBC: 4.74 x10E6/uL (ref 3.77–5.28)
RDW: 14.7 % (ref 12.3–15.4)
WBC: 10.3 10*3/uL (ref 3.4–10.8)

## 2016-06-02 LAB — LIPID PANEL
CHOL/HDL RATIO: 3.6 ratio (ref 0.0–4.4)
Cholesterol, Total: 197 mg/dL (ref 100–199)
HDL: 54 mg/dL (ref >39)
LDL Calculated: 105 mg/dL — ABNORMAL HIGH (ref 0–99)
TRIGLYCERIDES: 190 mg/dL — AB (ref 0–149)
VLDL Cholesterol Cal: 38 mg/dL (ref 5–40)

## 2016-06-06 ENCOUNTER — Ambulatory Visit (INDEPENDENT_AMBULATORY_CARE_PROVIDER_SITE_OTHER): Payer: Medicare Other | Admitting: Emergency Medicine

## 2016-06-06 VITALS — BP 138/70 | HR 84 | Temp 98.2°F | Resp 16 | Ht 61.0 in | Wt 167.6 lb

## 2016-06-06 DIAGNOSIS — R202 Paresthesia of skin: Secondary | ICD-10-CM | POA: Insufficient documentation

## 2016-06-06 DIAGNOSIS — I2510 Atherosclerotic heart disease of native coronary artery without angina pectoris: Secondary | ICD-10-CM

## 2016-06-06 NOTE — Progress Notes (Signed)
Sierra Carpenter 70 y.o.   Chief Complaint  Patient presents with  . lip numbness    swollen    HISTORY OF PRESENT ILLNESS: This is a 71 y.o. female complaining of numbness to left lower lip x several days; had shingles vaccine here last Wednesday and developed left arm inflammation/soreness that is now better; also developed several blisters in the posterior scalp that are mostly gone now; no other significant symptoms.  HPI   Prior to Admission medications   Medication Sig Start Date End Date Taking? Authorizing Provider  Ascorbic Acid (VITAMIN C PO) Take by mouth daily.   Yes Historical Provider, MD  aspirin 81 MG tablet Take 160 mg by mouth daily.   Yes Historical Provider, MD  atorvastatin (LIPITOR) 40 MG tablet Take 1 tablet (40 mg total) by mouth daily. 06/01/16  Yes Wardell Honour, MD  Calcium Carb-Cholecalciferol (CALCIUM 600+D3) 600-200 MG-UNIT TABS Take by mouth 2 (two) times daily.   Yes Historical Provider, MD  co-enzyme Q-10 30 MG capsule Take 100 mg by mouth 2 (two) times daily.   Yes Historical Provider, MD  fish oil-omega-3 fatty acids 1000 MG capsule Take 1 capsule by mouth 3 (three) times daily.   Yes Historical Provider, MD  losartan-hydrochlorothiazide (HYZAAR) 50-12.5 MG tablet Take 1 tablet by mouth daily. 06/01/16  Yes Wardell Honour, MD  Multiple Vitamin (MULTIVITAMIN) tablet Take 1 tablet by mouth daily.   Yes Historical Provider, MD  Multiple Vitamins-Minerals (PRESERVISION AREDS 2 PO) Take by mouth 2 (two) times daily.   Yes Historical Provider, MD  albuterol (PROVENTIL HFA;VENTOLIN HFA) 108 (90 Base) MCG/ACT inhaler Inhale 2 puffs into the lungs every 4 (four) hours as needed for wheezing or shortness of breath (cough, shortness of breath or wheezing.). Patient not taking: Reported on 06/06/2016 05/12/16   Horald Pollen, MD    No Known Allergies  Patient Active Problem List   Diagnosis Date Noted  . Aortic valve sclerosis 08/28/2015  . Polycythemia  06/01/2015  . Coronary atherosclerosis 01/01/2013  . Hypertension 05/09/2011  . Hyperlipidemia 05/09/2011  . Colon polyps 05/09/2011    Past Medical History:  Diagnosis Date  . Aortic valve sclerosis   . Coronary atherosclerosis    without significant obstruction  . Heart murmur   . History of nuclear stress test 07/12/2010   bruce protocol myoview; normal pattern of perfusion, low risk, post-stress EF 92%  . Hyperlipidemia   . Hypertension     Past Surgical History:  Procedure Laterality Date  . ABDOMINAL HYSTERECTOMY     fibroids; ovaries intact.  . APPENDECTOMY    . CARDIAC CATHETERIZATION     moderate stenosis in 1st diagonal & RCA  . CESAREAN SECTION    . CHOLECYSTECTOMY    . TRANSTHORACIC ECHOCARDIOGRAM  07/12/2010   EF=>55% with normal systolic function; trace MR/TR/PR    Social History   Social History  . Marital status: Married    Spouse name: N/A  . Number of children: 1  . Years of education: 73   Occupational History  . 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 Smoking status: Never Smoker  . Smokeless tobacco: Never Used  . Alcohol use No  . Drug use: No  . Sexual activity: Yes   Other Topics Concern  . Not on file   Social History Narrative   Marital status: widowed since 2014 due to COPD; married x 37 years.  Children: 1 daughter; 1 son died of CHF age 45 pacemaker.  4 grandchildren.      Lives: alone      Employment: retired at age 76 from [employer].      Tobacco:  never       Alcohol:  never      Exercise:  None in 2017; was walking 2 miles daily.  DDD lumbar limiting walking.      ADLs: independent with ADLs; drives.        Advanced Directives: none; has paper.  FULL CODE; no prolonged measures.  HCPOA: daughter/Misty Wenn-Maness.       Family History  Problem Relation Age of Onset  . Heart disease Father 62    AMI  . Heart disease Brother   . Heart disease Son   . Heart disease Paternal Grandfather    . Alzheimer's disease Mother   . Heart disease Brother   . COPD Brother   . Heart disease Brother      Review of Systems  Constitutional: Negative for chills and fever.  HENT: Negative for congestion, ear pain, nosebleeds and sore throat.   Eyes: Negative for blurred vision, double vision and pain.  Respiratory: Negative for cough, shortness of breath and wheezing.   Cardiovascular: Negative for chest pain and palpitations.  Gastrointestinal: Negative for abdominal pain, diarrhea, nausea and vomiting.  Genitourinary: Negative for dysuria and hematuria.  Musculoskeletal: Negative for back pain, myalgias and neck pain.  Skin: Positive for rash (scalp).  Neurological: Positive for sensory change (lower lip). Negative for dizziness, speech change, focal weakness and headaches.  All other systems reviewed and are negative.  Vitals:   06/06/16 1440  BP: 138/70  Pulse: 84  Resp: 16  Temp: 98.2 F (36.8 C)     Physical Exam  Constitutional: She is oriented to person, place, and time. She appears well-developed and well-nourished.  HENT:  Head: Normocephalic and atraumatic.  Nose: Nose normal.  Mouth/Throat: Oropharynx is clear and moist. No oropharyngeal exudate.  Eyes: Conjunctivae and EOM are normal. Pupils are equal, round, and reactive to light.  Neck: Normal range of motion. Neck supple. No JVD present. No thyromegaly present.  Cardiovascular: Normal rate, regular rhythm and normal heart sounds.   Pulmonary/Chest: Effort normal and breath sounds normal.  Abdominal: Soft. She exhibits no distension. There is no tenderness.  Musculoskeletal: Normal range of motion.  LUE : NVI, no cellulitis, FROM  Lymphadenopathy:    She has no cervical adenopathy.  Neurological: She is alert and oriented to person, place, and time. She displays normal reflexes. No cranial nerve deficit or sensory deficit. She exhibits normal muscle tone. Coordination normal.  Skin: Skin is warm and dry.  Capillary refill takes less than 2 seconds. No rash noted.  Psychiatric: She has a normal mood and affect. Her behavior is normal.  Vitals reviewed.    ASSESSMENT & PLAN: Sierra Carpenter was seen today for lip numbness.  Diagnoses and all orders for this visit:  Paresthesia of lower lip   No sign of CVA.   Agustina Caroli, MD [facility]

## 2016-06-06 NOTE — Patient Instructions (Signed)
     IF you received an x-ray today, you will receive an invoice from [facility]. Please contact [facility] at [phone] with questions or concerns regarding your invoice.   IF you received labwork today, you will receive an invoice from [facility]. Please contact [facility] at [phone] with questions or concerns regarding your invoice.   Our billing staff will not be able to assist you with questions regarding bills from these companies.  You will be contacted with the lab results as soon as they are available. The fastest way to get your results is to activate your My Chart account. Instructions are located on the last page of this paperwork. If you have not heard from us regarding the results in 2 weeks, please contact this office.     

## 2016-06-07 DIAGNOSIS — D1801 Hemangioma of skin and subcutaneous tissue: Secondary | ICD-10-CM | POA: Diagnosis not present

## 2016-06-07 DIAGNOSIS — L570 Actinic keratosis: Secondary | ICD-10-CM | POA: Diagnosis not present

## 2016-06-07 DIAGNOSIS — L814 Other melanin hyperpigmentation: Secondary | ICD-10-CM | POA: Diagnosis not present

## 2016-06-07 DIAGNOSIS — D225 Melanocytic nevi of trunk: Secondary | ICD-10-CM | POA: Diagnosis not present

## 2016-06-07 DIAGNOSIS — L821 Other seborrheic keratosis: Secondary | ICD-10-CM | POA: Diagnosis not present

## 2016-06-13 DIAGNOSIS — Z6831 Body mass index (BMI) 31.0-31.9, adult: Secondary | ICD-10-CM

## 2016-06-13 DIAGNOSIS — E6609 Other obesity due to excess calories: Secondary | ICD-10-CM | POA: Insufficient documentation

## 2016-06-24 DIAGNOSIS — H353131 Nonexudative age-related macular degeneration, bilateral, early dry stage: Secondary | ICD-10-CM | POA: Diagnosis not present

## 2016-06-24 DIAGNOSIS — D2211 Melanocytic nevi of right eyelid, including canthus: Secondary | ICD-10-CM | POA: Diagnosis not present

## 2016-06-24 DIAGNOSIS — H2513 Age-related nuclear cataract, bilateral: Secondary | ICD-10-CM | POA: Diagnosis not present

## 2016-06-24 DIAGNOSIS — H53002 Unspecified amblyopia, left eye: Secondary | ICD-10-CM | POA: Diagnosis not present

## 2016-07-12 ENCOUNTER — Other Ambulatory Visit: Payer: Self-pay | Admitting: Ophthalmology

## 2016-07-12 DIAGNOSIS — D2211 Melanocytic nevi of right eyelid, including canthus: Secondary | ICD-10-CM | POA: Diagnosis not present

## 2016-09-19 ENCOUNTER — Encounter: Payer: Self-pay | Admitting: Emergency Medicine

## 2016-09-19 ENCOUNTER — Ambulatory Visit (INDEPENDENT_AMBULATORY_CARE_PROVIDER_SITE_OTHER): Payer: Medicare Other | Admitting: Emergency Medicine

## 2016-09-19 VITALS — BP 100/62 | HR 93 | Temp 97.8°F | Resp 18 | Ht 61.0 in | Wt 173.0 lb

## 2016-09-19 DIAGNOSIS — M25861 Other specified joint disorders, right knee: Secondary | ICD-10-CM | POA: Diagnosis not present

## 2016-09-19 DIAGNOSIS — I2510 Atherosclerotic heart disease of native coronary artery without angina pectoris: Secondary | ICD-10-CM | POA: Diagnosis not present

## 2016-09-19 NOTE — Progress Notes (Signed)
Sierra Carpenter 70 y.o.   Chief Complaint  Patient presents with  . Knee Pain    knot on right knee x3days     HISTORY OF PRESENT ILLNESS: This is a 71 y.o. female complaining of "cyst" on right side of right knee x 3 days; improving; denies trauma.   HPI   Prior to Admission medications   Medication Sig Start Date End Date Taking? Authorizing Provider  Ascorbic Acid (VITAMIN C PO) Take by mouth daily.   Yes [provider]  aspirin 81 MG tablet Take 160 mg by mouth daily.   Yes [provider]  atorvastatin (LIPITOR) 40 MG tablet Take 1 tablet (40 mg total) by mouth daily. 06/01/16  Yes Wardell Honour, MD  Calcium Carb-Cholecalciferol (CALCIUM 600+D3) 600-200 MG-UNIT TABS Take by mouth 2 (two) times daily.   Yes [provider]  co-enzyme Q-10 30 MG capsule Take 100 mg by mouth 2 (two) times daily.   Yes [provider]  fish oil-omega-3 fatty acids 1000 MG capsule Take 1 capsule by mouth 3 (three) times daily.   Yes [provider]  losartan-hydrochlorothiazide (HYZAAR) 50-12.5 MG tablet Take 1 tablet by mouth daily. 06/01/16  Yes Wardell Honour, MD  Multiple Vitamin (MULTIVITAMIN) tablet Take 1 tablet by mouth daily.   Yes [provider]  Multiple Vitamins-Minerals (PRESERVISION AREDS 2 PO) Take by mouth 2 (two) times daily.   Yes [provider]  albuterol (PROVENTIL HFA;VENTOLIN HFA) 108 (90 Base) MCG/ACT inhaler Inhale 2 puffs into the lungs every 4 (four) hours as needed for wheezing or shortness of breath (cough, shortness of breath or wheezing.). Patient not taking: Reported on 06/06/2016 05/12/16   Horald Pollen, MD    No Known Allergies  Patient Active Problem List   Diagnosis Date Noted  . Class 1 obesity due to excess calories with serious comorbidity and body mass index (BMI) of 31.0 to 31.9 in adult 06/13/2016  . Paresthesia of lower lip 06/06/2016  . Aortic valve sclerosis 08/28/2015  .  Polycythemia 06/01/2015  . Coronary atherosclerosis 01/01/2013  . Hypertension 05/09/2011  . Hyperlipidemia 05/09/2011  . Colon polyps 05/09/2011    Past Medical History:  Diagnosis Date  . Aortic valve sclerosis   . Coronary atherosclerosis    without significant obstruction  . Heart murmur   . History of nuclear stress test 07/12/2010   bruce protocol myoview; normal pattern of perfusion, low risk, post-stress EF 92%  . Hyperlipidemia   . Hypertension     Past Surgical History:  Procedure Laterality Date  . ABDOMINAL HYSTERECTOMY     fibroids; ovaries intact.  . APPENDECTOMY    . CARDIAC CATHETERIZATION     moderate stenosis in 1st diagonal & RCA  . CESAREAN SECTION    . CHOLECYSTECTOMY    . TRANSTHORACIC ECHOCARDIOGRAM  07/12/2010   EF=>55% with normal systolic function; trace MR/TR/PR    Social History   Social History  . Marital status: Married    Spouse name: N/A  . Number of children: 1  . Years of education: 20   Occupational History  . 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 Smoking status: Never Smoker  . Smokeless tobacco: Never Used  . Alcohol use No  . Drug use: No  . Sexual activity: Yes   Other Topics Concern  . Not on file   Social History Narrative   Marital status: widowed since  2014 due to COPD; married x 37 years.      Children: 1 daughter; 1 son died of CHF age 45 pacemaker.  4 grandchildren.      Lives: alone      Employment: retired at age 10 from [employer].      Tobacco:  never       Alcohol:  never      Exercise:  None in 2017; was walking 2 miles daily.  DDD lumbar limiting walking.      ADLs: independent with ADLs; drives.        Advanced Directives: none; has paper.  FULL CODE; no prolonged measures.  HCPOA: daughter/Misty Wenn-Maness.       Family History  Problem Relation Age of Onset  . Heart disease Father 63       AMI  . Heart disease Brother   . Heart disease Son   . Heart disease  Paternal Grandfather   . Alzheimer's disease Mother   . Heart disease Brother   . COPD Brother   . Heart disease Brother      Review of Systems  Constitutional: Negative.  Negative for chills, fever and malaise/fatigue.  HENT: Negative.   Eyes: Negative.   Cardiovascular: Negative.   Gastrointestinal: Negative for abdominal pain, nausea and vomiting.  Genitourinary: Negative for dysuria and hematuria.  Musculoskeletal: Negative for back pain and joint pain.  Skin: Negative.  Negative for rash.  Neurological: Negative for dizziness and headaches.  All other systems reviewed and are negative.  Vitals:   09/19/16 1128  BP: 100/62  Pulse: 93  Resp: 18  Temp: 97.8 F (36.6 C)    Physical Exam  Constitutional: She is oriented to person, place, and time. She appears well-developed and well-nourished.  HENT:  Head: Normocephalic and atraumatic.  Eyes: EOM are normal. Pupils are equal, round, and reactive to light.  Neck: Normal range of motion. Neck supple.  Cardiovascular: Normal rate and regular rhythm.   Pulmonary/Chest: Effort normal and breath sounds normal.  Musculoskeletal:  Right knee: lateral non-tender cystic mass without fluctuation or surrounding erythema or bruising. FROM; rest of extremities WNL.  Neurological: She is alert and oriented to person, place, and time.  Skin: Skin is warm and dry. Capillary refill takes less than 2 seconds.  Psychiatric: She has a normal mood and affect.  Vitals reviewed.    ASSESSMENT & PLAN: Sierra Carpenter was seen today for knee pain.  Diagnoses and all orders for this visit:  Cyst of right knee joint    Patient Instructions       IF you received an x-ray today, you will receive an invoice from [facility]. Please contact [facility] at [phone] with questions or concerns regarding your invoice.   IF you received labwork today, you will receive an invoice from [place]. Please contact [facility] at  [phone] with questions or concerns regarding your invoice.   Our billing staff will not be able to assist you with questions regarding bills from these companies.  You will be contacted with the lab results as soon as they are available. The fastest way to get your results is to activate your My Chart account. Instructions are located on the last page of this paperwork. If you have not heard from [place] regarding the results in 2 weeks, please contact this office.      Baker Cyst A Baker cyst, also called a popliteal cyst, is a sac-like growth that forms at the back of the knee.  The cyst forms when the fluid-filled sac (bursa) that cushions the knee joint becomes enlarged. The bursa that becomes a Baker cyst is located at the back of the knee joint. What are the causes? In most cases, a Baker cyst results from another knee problem that causes swelling inside the knee. This makes the fluid inside the knee joint (synovial fluid) flow into the bursa behind the knee, causing the bursa to enlarge. What increases the risk? You may be more likely to develop a Baker cyst if you already have a knee problem, such as:  A tear in cartilage that cushions the knee joint (meniscal tear).  A tear in the tissues that connect the bones of the knee joint (ligament tear).  Knee swelling from osteoarthritis, rheumatoid arthritis, or gout.  What are the signs or symptoms? A Baker cyst does not always cause symptoms. A lump behind the knee may be the only sign of the condition. The lump may be painful, especially when the knee is straightened. If the lump is painful, the pain may come and go. The knee may also be stiff. Symptoms may quickly get more severe if the cyst breaks open (ruptures). If your cyst ruptures, signs and symptoms may affect the knee and the back of the lower leg (calf) and may include:  Sudden or worsening pain.  Swelling.  Bruising.  How is this diagnosed? This condition may be  diagnosed based on your symptoms and medical history. Your health care provider will also do a physical exam. This may include:  Feeling the cyst to check whether it is tender.  Checking your knee for signs of another knee condition that causes swelling.  You may have imaging tests, such as:  X-rays.  MRI.  Ultrasound.  How is this treated? A Baker cyst that is not painful may go away without treatment. If the cyst gets large or painful, it will likely get better if the underlying knee problem is treated. Treatment for a Baker cyst may include:  Resting.  Keeping weight off of the knee. This means not leaning on the knee to support your body weight.  NSAIDs to reduce pain and swelling.  A procedure to drain the fluid from the cyst with a needle (aspiration). You may also get an injection of a medicine that reduces swelling (steroid).  Surgery. This may be needed if other treatments do not work. This usually involves correcting knee damage and removing the cyst.  Follow these instructions at home:  Take over-the-counter and prescription medicines only as told by your health care provider.  Rest and return to your normal activities as told by your health care provider. Avoid activities that make pain or swelling worse. Ask your health care provider what activities are safe for you.  Keep all follow-up visits as told by your health care provider. This is important. Contact a health care provider if:  You have knee pain, stiffness, or swelling that does not get better. Get help right away if:  You have sudden or worsening pain and swelling in your calf area. This information is not intended to replace advice given to you by your health care provider. Make sure you discuss any questions you have with your health care provider. Document Released: 03/21/2005 Document Revised: 12/10/2015 Document Reviewed: 12/10/2015 Elsevier Interactive Patient Education  2018 [employer]vier  Inc.      Agustina Caroli, MD [facility]

## 2016-09-19 NOTE — Patient Instructions (Addendum)
   IF you received an x-ray today, you will receive an invoice from [facility]. Please contact [facility] at [phone] with questions or concerns regarding your invoice.   IF you received labwork today, you will receive an invoice from [facility]. Please contact [facility] at [phone] with questions or concerns regarding your invoice.   Our billing staff will not be able to assist you with questions regarding bills from these companies.  You will be contacted with the lab results as soon as they are available. The fastest way to get your results is to activate your My Chart account. Instructions are located on the last page of this paperwork. If you have not heard from us regarding the results in 2 weeks, please contact this office.     Baker Cyst A Baker cyst, also called a popliteal cyst, is a sac-like growth that forms at the back of the knee. The cyst forms when the fluid-filled sac (bursa) that cushions the knee joint becomes enlarged. The bursa that becomes a Baker cyst is located at the back of the knee joint. What are the causes? In most cases, a Baker cyst results from another knee problem that causes swelling inside the knee. This makes the fluid inside the knee joint (synovial fluid) flow into the bursa behind the knee, causing the bursa to enlarge. What increases the risk? You may be more likely to develop a Baker cyst if you already have a knee problem, such as:  A tear in cartilage that cushions the knee joint (meniscal tear).  A tear in the tissues that connect the bones of the knee joint (ligament tear).  Knee swelling from osteoarthritis, rheumatoid arthritis, or gout.  What are the signs or symptoms? A Baker cyst does not always cause symptoms. A lump behind the knee may be the only sign of the condition. The lump may be painful, especially when the knee is straightened. If the lump is painful, the pain may come and go. The knee may also be  stiff. Symptoms may quickly get more severe if the cyst breaks open (ruptures). If your cyst ruptures, signs and symptoms may affect the knee and the back of the lower leg (calf) and may include:  Sudden or worsening pain.  Swelling.  Bruising.  How is this diagnosed? This condition may be diagnosed based on your symptoms and medical history. Your health care provider will also do a physical exam. This may include:  Feeling the cyst to check whether it is tender.  Checking your knee for signs of another knee condition that causes swelling.  You may have imaging tests, such as:  X-rays.  MRI.  Ultrasound.  How is this treated? A Baker cyst that is not painful may go away without treatment. If the cyst gets large or painful, it will likely get better if the underlying knee problem is treated. Treatment for a Baker cyst may include:  Resting.  Keeping weight off of the knee. This means not leaning on the knee to support your body weight.  NSAIDs to reduce pain and swelling.  A procedure to drain the fluid from the cyst with a needle (aspiration). You may also get an injection of a medicine that reduces swelling (steroid).  Surgery. This may be needed if other treatments do not work. This usually involves correcting knee damage and removing the cyst.  Follow these instructions at home:  Take over-the-counter and prescription medicines only as told by your health care provider.  Rest   and return to your normal activities as told by your health care provider. Avoid activities that make pain or swelling worse. Ask your health care provider what activities are safe for you.  Keep all follow-up visits as told by your health care provider. This is important. Contact a health care provider if:  You have knee pain, stiffness, or swelling that does not get better. Get help right away if:  You have sudden or worsening pain and swelling in your calf area. This information is not  intended to replace advice given to you by your health care provider. Make sure you discuss any questions you have with your health care provider. Document Released: 03/21/2005 Document Revised: 12/10/2015 Document Reviewed: 12/10/2015 Elsevier Interactive Patient Education  2018 Elsevier Inc.  

## 2016-10-03 ENCOUNTER — Other Ambulatory Visit: Payer: Self-pay | Admitting: Family Medicine

## 2016-10-03 DIAGNOSIS — R921 Mammographic calcification found on diagnostic imaging of breast: Secondary | ICD-10-CM

## 2016-10-13 ENCOUNTER — Ambulatory Visit
Admission: RE | Admit: 2016-10-13 | Discharge: 2016-10-13 | Disposition: A | Discharge status: Home / Self Care | Payer: Medicare Other | Source: Ambulatory Visit | Attending: Family Medicine | Admitting: Family Medicine

## 2016-10-13 DIAGNOSIS — R921 Mammographic calcification found on diagnostic imaging of breast: Secondary | ICD-10-CM

## 2016-10-13 DIAGNOSIS — R928 Other abnormal and inconclusive findings on diagnostic imaging of breast: Secondary | ICD-10-CM | POA: Diagnosis not present

## 2016-11-28 NOTE — Progress Notes (Signed)
Subjective:    Patient ID: Sierra Carpenter, female    DOB: 1945-07-20, 71 y.o.   MRN: [id number]  11/29/2016  Hypertension (6 month follow-up); Hyperlipidemia; and Back Pain   HPI This 71 y.o. female presents for evaluation of hypercholesterolemia, hypertension, back pain.  No changes to management made at last visit in 05/2016.  Has been walking on the treadmill but cannot walk for long due to chronic lower back pain.    LFTs were elevated at last visit with AST 46/ALT 72/Alkaline phosphatase 337.  Calcium also 10.9.  Due for repeat today.  Cholesterol under good control.   S/p mammogram in 10/2016 that revealed calcifications most consistent with benign process.  Follow up in December 2018.    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  Last lumbar spine films 2016.  Referred to orthopedist. Othelia Pulling.  Had nothing to offer patient; suffers with daily pain.  No radiation into legs; no n/t/w.  No saddle paresthesias; no b/b dysfunction. Pt called after last visit asking to be excused from jury duty; very upset that PCP did not provide this pardon.  Had a horrible reaction to Shingrix.  Woke up in middle of night; swollen and really hot. Had to ice arm.  The following week, had pimples on scalp and swelling of L facial region.  Refuses to have repeat Shingles vaccine.  Did have second dose; did well. Had an appointment with dermatologist the following day; was improving and healing up.  Took three weeks for numbness to resolve from L lower lip.    R knee cyst:  Has gone down from last visit.  Baker's cyst.  No xray of knee.  Much smaller; no referral to specialist.   Taking Vitamin D 800 IU with calcium daily.  Taking at breakfast and night.   BP Readings from Last 3 Encounters:  11/29/16 126/72  09/19/16 100/62  06/06/16 138/70   Wt Readings from Last 3 Encounters:  11/29/16 172 lb (78 kg)  09/19/16 173 lb (78.5 kg)  06/06/16 167 lb 9.6 oz (76 kg)   Immunization  History  Administered Date(s) Administered  . Influenza Split 12/08/2012  . Influenza,inj,Quad PF,6+ Mos 12/18/2014, 11/29/2016  . Influenza-Unspecified 12/10/2013, 11/30/2015, 06/01/2016  . Pneumococcal Conjugate-13 12/10/2013  . Pneumococcal Polysaccharide-23 12/18/2014  . Tdap 08/07/2010    Review of Systems  Constitutional: Negative for chills, diaphoresis, fatigue and fever.  Eyes: Negative for visual disturbance.  Respiratory: Negative for cough and shortness of breath.   Cardiovascular: Negative for chest pain, palpitations and leg swelling.  Gastrointestinal: Negative for abdominal pain, constipation, diarrhea, nausea and vomiting.  Endocrine: Negative for cold intolerance, heat intolerance, polydipsia, polyphagia and polyuria.  Musculoskeletal: Positive for back pain and gait problem.  Neurological: Negative for dizziness, tremors, seizures, syncope, facial asymmetry, speech difficulty, weakness, light-headedness, numbness and headaches.    Past Medical History:  Diagnosis Date  . Aortic valve sclerosis   . Coronary atherosclerosis    without significant obstruction  . Heart murmur   . History of nuclear stress test 07/12/2010   bruce protocol myoview; normal pattern of perfusion, low risk, post-stress EF 92%  . Hyperlipidemia   . Hypertension    Past Surgical History:  Procedure Laterality Date  . ABDOMINAL HYSTERECTOMY     fibroids; ovaries intact.  . APPENDECTOMY    . CARDIAC CATHETERIZATION     moderate stenosis in 1st diagonal & RCA  . CESAREAN SECTION    . CHOLECYSTECTOMY    .  TRANSTHORACIC ECHOCARDIOGRAM  07/12/2010   EF=>55% with normal systolic function; trace MR/TR/PR   No Known Allergies Current Outpatient Prescriptions  Medication Sig Dispense Refill  . albuterol (PROVENTIL HFA;VENTOLIN HFA) 108 (90 Base) MCG/ACT inhaler Inhale 2 puffs into the lungs every 4 (four) hours as needed for wheezing or shortness of breath (cough, shortness of breath or  wheezing.). 1 Inhaler 1  . Ascorbic Acid (VITAMIN C PO) Take by mouth daily.    Marland Kitchen aspirin 81 MG tablet Take 1 tablet (81 mg total) by mouth daily. 1 tablet   . atorvastatin (LIPITOR) 40 MG tablet Take 1 tablet (40 mg total) by mouth daily. 90 tablet 3  . Calcium Carb-Cholecalciferol (CALCIUM 600+D3) 600-200 MG-UNIT TABS Take by mouth 2 (two) times daily.    Marland Kitchen co-enzyme Q-10 30 MG capsule Take 100 mg by mouth 2 (two) times daily.    . fish oil-omega-3 fatty acids 1000 MG capsule Take 1 capsule by mouth 3 (three) times daily.    Marland Kitchen losartan-hydrochlorothiazide (HYZAAR) 50-12.5 MG tablet Take 1 tablet by mouth daily. 90 tablet 3  . Multiple Vitamins-Minerals (PRESERVISION AREDS 2 PO) Take by mouth 2 (two) times daily.    . meloxicam (MOBIC) 7.5 MG tablet Take 1 tablet (7.5 mg total) by mouth daily. 90 tablet 0   No current facility-administered medications for this visit.    Social History   Social History  . Marital status: Married    Spouse name: N/A  . Number of children: 1  . Years of education: 10   Occupational History  . 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 Smoking status: Never Smoker  . Smokeless tobacco: Never Used  . Alcohol use No  . Drug use: No  . Sexual activity: Yes   Other Topics Concern  . Not on file   Social History Narrative   Marital status: widowed since 2014 due to COPD; married x 37 years.      Children: 1 daughter; 1 son died of CHF age 56 pacemaker.  4 grandchildren.      Lives: alone      Employment: retired at age 28 from [employer].      Tobacco:  never       Alcohol:  never      Exercise:  None in 2017; was walking 2 miles daily.  DDD lumbar limiting walking.      ADLs: independent with ADLs; drives.        Advanced Directives: none; has paper.  FULL CODE; no prolonged measures.  HCPOA: daughter/Misty Wenn-Maness.      Family History  Problem Relation Age of Onset  . Heart disease Father 48       AMI  . Heart  disease Brother   . Heart disease Son   . Heart disease Paternal Grandfather   . Alzheimer's disease Mother   . Heart disease Brother   . COPD Brother   . Heart disease Brother   . Breast cancer Neg Hx        Objective:    BP 126/72   Pulse 84   Temp 98 F (36.7 C) (Oral)   Resp 16   Ht 5' 2.21" (1.58 m)   Wt 172 lb (78 kg)   SpO2 95%   BMI 31.25 kg/m  Physical Exam  Constitutional: She is oriented to person, place, and time. She appears well-developed and well-nourished. No distress.  HENT:  Head: Normocephalic  and atraumatic.  Right Ear: External ear normal.  Left Ear: External ear normal.  Nose: Nose normal.  Mouth/Throat: Oropharynx is clear and moist.  Eyes: Pupils are equal, round, and reactive to light. Conjunctivae and EOM are normal.  Neck: Normal range of motion. Neck supple. Carotid bruit is not present. No thyromegaly present.  Cardiovascular: Normal rate, regular rhythm, normal heart sounds and intact distal pulses.  Exam reveals no gallop and no friction rub.   No murmur heard. Pulmonary/Chest: Effort normal and breath sounds normal. She has no wheezes. She has no rales.  Abdominal: Soft. Bowel sounds are normal. She exhibits no distension and no mass. There is no tenderness. There is no rebound and no guarding.  Musculoskeletal:       Lumbar back: She exhibits decreased range of motion, pain and spasm. She exhibits no tenderness, no bony tenderness and normal pulse.       Legs: R KNEE: large cystic lesion lateral inferior aspect of knee; full ROM of knee; normal extension and flexion.   Lymphadenopathy:    She has no cervical adenopathy.  Neurological: She is alert and oriented to person, place, and time. No cranial nerve deficit.  Skin: Skin is warm and dry. No rash noted. She is not diaphoretic. No erythema. No pallor.  Psychiatric: She has a normal mood and affect. Her behavior is normal.   Results for orders placed or performed in visit on 06/01/16    CBC with Differential/Platelet  Result Value Ref Range   WBC 10.3 3.4 - 10.8 x10E3/uL   RBC 4.74 3.77 - 5.28 x10E6/uL   Hemoglobin 15.6 11.1 - 15.9 g/dL   Hematocrit 44.6 34.0 - 46.6 %   MCV 94 79 - 97 fL   MCH 32.9 26.6 - 33.0 pg   MCHC 35.0 31.5 - 35.7 g/dL   RDW 14.7 12.3 - 15.4 %   Platelets 332 150 - 379 x10E3/uL   Neutrophils 62 Not Estab. %   Lymphs 21 Not Estab. %   Monocytes 7 Not Estab. %   Eos 8 Not Estab. %   Basos 1 Not Estab. %   Neutrophils Absolute 6.5 1.4 - 7.0 x10E3/uL   Lymphocytes Absolute 2.1 0.7 - 3.1 x10E3/uL   Monocytes Absolute 0.8 0.1 - 0.9 x10E3/uL   EOS (ABSOLUTE) 0.8 (H) 0.0 - 0.4 x10E3/uL   Basophils Absolute 0.1 0.0 - 0.2 x10E3/uL   Immature Granulocytes 1 Not Estab. %   Immature Grans (Abs) 0.1 0.0 - 0.1 x10E3/uL  Comprehensive metabolic panel  Result Value Ref Range   Glucose 91 65 - 99 mg/dL   BUN 14 8 - 27 mg/dL   Creatinine, Ser 0.94 0.57 - 1.00 mg/dL   GFR calc non Af Amer 63 >59 mL/min/1.73   GFR calc Af Amer 72 >59 mL/min/1.73   BUN/Creatinine Ratio 15 12 - 28   Sodium 146 (H) 134 - 144 mmol/L   Potassium 4.5 3.5 - 5.2 mmol/L   Chloride 100 96 - 106 mmol/L   CO2 27 18 - 29 mmol/L   Calcium 10.9 (H) 8.7 - 10.3 mg/dL   Total Protein 6.8 6.0 - 8.5 g/dL   Albumin 4.4 3.6 - 4.8 g/dL   Globulin, Total 2.4 1.5 - 4.5 g/dL   Albumin/Globulin Ratio 1.8 1.2 - 2.2   Bilirubin Total 0.5 0.0 - 1.2 mg/dL   Alkaline Phosphatase 337 (H) 39 - 117 IU/L   AST 46 (H) 0 - 40 IU/L   ALT 72 (H) 0 -  32 IU/L  Lipid panel  Result Value Ref Range   Cholesterol, Total 197 100 - 199 mg/dL   Triglycerides 190 (H) 0 - 149 mg/dL   HDL 54 >39 mg/dL   VLDL Cholesterol Cal 38 5 - 40 mg/dL   LDL Calculated 105 (H) 0 - 99 mg/dL   Chol/HDL Ratio 3.6 0.0 - 4.4 ratio units   No results found. Depression screen [facility] 2/9 11/29/2016 09/19/2016 06/06/2016 06/01/2016 05/12/2016  Decreased Interest 0 0 0 0 0  Down, Depressed, Hopeless 0 0 0 0 0  PHQ - 2 Score 0 0 0 0 0    Fall Risk  11/29/2016 09/19/2016 06/06/2016 06/01/2016 05/12/2016  Falls in the past year? No No No No No        Assessment & Plan:   1. Essential hypertension   2. Pure hypercholesterolemia   3. Aortic valve sclerosis   4. Atherosclerosis of native coronary artery of native heart without angina pectoris   5. Polycythemia   6. Need for immunization against influenza   7. Degenerative disc disease, lumbar   8. Cyst of right knee joint   9. Urge incontinence   10. Hypercalcemia   11. Breast calcifications    -New onset R knee cyst; obtain knee xray; refer to ortho. -worsening lower back pain; repeat lumbar spine imaging as has been several years since has been imaged; due to worsening pain, refer back to ortho for consultation; may be a candidate for injections. -new onset breast calcifications on mammography; to follow closely with repeat imaging in six months. -new onset urge incontinence; send urine cx to rule out UTI with recent and sudden onset of symptoms. -obtain labs for chronic disease management. -new onset hypercalcemia with elevated LFTs; repeat labs today.  Obtain PTH intact. -prolonged face-to-face for 40 minutes with greater than 50% of time dedicated to counseling and coordination of care.   Orders Placed This Encounter  Procedures  . Urine Culture  . DG Lumbar Spine Complete    Standing Status:   Future    Number of Occurrences:   1    Standing Expiration Date:   11/29/2017    Order Specific Question:   Reason for Exam (SYMPTOM  OR DIAGNOSIS REQUIRED)    Answer:   R lateral knee cyst?    Order Specific Question:   Preferred imaging location?    Answer:   External  . DG Knee Complete 4 Views Right    Standing Status:   Future    Number of Occurrences:   1    Standing Expiration Date:   11/29/2017    Order Specific Question:   Reason for Exam (SYMPTOM  OR DIAGNOSIS REQUIRED)    Answer:   R lateral knee cyst?    Order Specific Question:   Preferred imaging  location?    Answer:   External  . Flu Vaccine QUAD 36+ mos IM  . CBC with Differential/Platelet  . Comprehensive metabolic panel    Order Specific Question:   Has the patient fasted?    Answer:   Yes  . Lipid panel    Order Specific Question:   Has the patient fasted?    Answer:   Yes  . Urinalysis, Routine w reflex microscopic  . Parathyroid hormone, intact (no Ca)  . Ambulatory referral to Orthopedic Surgery    Referral Priority:   Routine    Referral Type:   Surgical    Referral Reason:   Specialty Services Required  Requested Specialty:   Orthopedic Surgery    Number of Visits Requested:   1   Meds ordered this encounter  Medications  . meloxicam (MOBIC) 7.5 MG tablet    Sig: Take 1 tablet (7.5 mg total) by mouth daily.    Dispense:  90 tablet    Refill:  0  . aspirin 81 MG tablet    Sig: Take 1 tablet (81 mg total) by mouth daily.    Dispense:  1 tablet    Return in about 6 months (around 06/01/2017) for complete physical examiniation.   Natasa Stigall Elayne Guerin, M.D. Primary [facility] at [facility] previously [facility] [address] [place], [place]  [address] [phone] phone [phone] fax

## 2016-11-29 ENCOUNTER — Ambulatory Visit (INDEPENDENT_AMBULATORY_CARE_PROVIDER_SITE_OTHER): Payer: Medicare Other

## 2016-11-29 ENCOUNTER — Ambulatory Visit (INDEPENDENT_AMBULATORY_CARE_PROVIDER_SITE_OTHER): Payer: Medicare Other | Admitting: Family Medicine

## 2016-11-29 ENCOUNTER — Encounter: Payer: Self-pay | Admitting: Family Medicine

## 2016-11-29 VITALS — BP 126/72 | HR 84 | Temp 98.0°F | Resp 16 | Ht 62.21 in | Wt 172.0 lb

## 2016-11-29 DIAGNOSIS — I358 Other nonrheumatic aortic valve disorders: Secondary | ICD-10-CM

## 2016-11-29 DIAGNOSIS — D751 Secondary polycythemia: Secondary | ICD-10-CM | POA: Diagnosis not present

## 2016-11-29 DIAGNOSIS — M25861 Other specified joint disorders, right knee: Secondary | ICD-10-CM

## 2016-11-29 DIAGNOSIS — R921 Mammographic calcification found on diagnostic imaging of breast: Secondary | ICD-10-CM | POA: Diagnosis not present

## 2016-11-29 DIAGNOSIS — M5136 Other intervertebral disc degeneration, lumbar region: Secondary | ICD-10-CM | POA: Diagnosis not present

## 2016-11-29 DIAGNOSIS — N3941 Urge incontinence: Secondary | ICD-10-CM | POA: Diagnosis not present

## 2016-11-29 DIAGNOSIS — I2510 Atherosclerotic heart disease of native coronary artery without angina pectoris: Secondary | ICD-10-CM

## 2016-11-29 DIAGNOSIS — I10 Essential (primary) hypertension: Secondary | ICD-10-CM

## 2016-11-29 DIAGNOSIS — M7989 Other specified soft tissue disorders: Secondary | ICD-10-CM | POA: Diagnosis not present

## 2016-11-29 DIAGNOSIS — E7800 Pure hypercholesterolemia, unspecified: Secondary | ICD-10-CM

## 2016-11-29 DIAGNOSIS — Z23 Encounter for immunization: Secondary | ICD-10-CM | POA: Diagnosis not present

## 2016-11-29 MED ORDER — ASPIRIN 81 MG PO TABS: 81.0000 mg | ORAL_TABLET | Freq: Every day | ORAL | Status: DC

## 2016-11-29 MED ORDER — MELOXICAM 7.5 MG PO TABS: 7.5000 mg | ORAL_TABLET | Freq: Every day | ORAL | 0 refills | Status: DC

## 2016-11-29 NOTE — Patient Instructions (Signed)
     IF you received an x-ray today, you will receive an invoice from [facility]. Please contact [facility] at [phone] with questions or concerns regarding your invoice.   IF you received labwork today, you will receive an invoice from [facility]. Please contact [facility] at [phone] with questions or concerns regarding your invoice.   Our billing staff will not be able to assist you with questions regarding bills from these companies.  You will be contacted with the lab results as soon as they are available. The fastest way to get your results is to activate your My Chart account. Instructions are located on the last page of this paperwork. If you have not heard from us regarding the results in 2 weeks, please contact this office.     

## 2016-11-30 LAB — COMPREHENSIVE METABOLIC PANEL
A/G RATIO: 1.8 (ref 1.2–2.2)
ALT: 19 IU/L (ref 0–32)
AST: 20 IU/L (ref 0–40)
Albumin: 4.7 g/dL (ref 3.6–4.8)
Alkaline Phosphatase: 103 IU/L (ref 39–117)
BILIRUBIN TOTAL: 0.5 mg/dL (ref 0.0–1.2)
BUN/Creatinine Ratio: 17 (ref 12–28)
BUN: 20 mg/dL (ref 8–27)
CHLORIDE: 99 mmol/L (ref 96–106)
CO2: 25 mmol/L (ref 20–29)
Calcium: 11.8 mg/dL — ABNORMAL HIGH (ref 8.7–10.3)
Creatinine, Ser: 1.15 mg/dL — ABNORMAL HIGH (ref 0.57–1.00)
GFR calc Af Amer: 56 mL/min/{1.73_m2} — ABNORMAL LOW (ref >59)
GFR calc non Af Amer: 49 mL/min/{1.73_m2} — ABNORMAL LOW (ref >59)
GLUCOSE: 104 mg/dL — AB (ref 65–99)
Globulin, Total: 2.6 g/dL (ref 1.5–4.5)
POTASSIUM: 4.7 mmol/L (ref 3.5–5.2)
Sodium: 142 mmol/L (ref 134–144)
Total Protein: 7.3 g/dL (ref 6.0–8.5)

## 2016-11-30 LAB — CBC WITH DIFFERENTIAL/PLATELET
BASOS ABS: 0.1 10*3/uL (ref 0.0–0.2)
Basos: 1 %
EOS (ABSOLUTE): 0.6 10*3/uL — ABNORMAL HIGH (ref 0.0–0.4)
Eos: 6 %
Hematocrit: 49.5 % — ABNORMAL HIGH (ref 34.0–46.6)
Hemoglobin: 16.9 g/dL — ABNORMAL HIGH (ref 11.1–15.9)
IMMATURE GRANS (ABS): 0 10*3/uL (ref 0.0–0.1)
Immature Granulocytes: 0 %
LYMPHS ABS: 2.2 10*3/uL (ref 0.7–3.1)
LYMPHS: 24 %
MCH: 32.5 pg (ref 26.6–33.0)
MCHC: 34.1 g/dL (ref 31.5–35.7)
MCV: 95 fL (ref 79–97)
MONOCYTES: 5 %
Monocytes Absolute: 0.5 10*3/uL (ref 0.1–0.9)
NEUTROS ABS: 5.9 10*3/uL (ref 1.4–7.0)
Neutrophils: 64 %
PLATELETS: 312 10*3/uL (ref 150–379)
RBC: 5.2 x10E6/uL (ref 3.77–5.28)
RDW: 13.7 % (ref 12.3–15.4)
WBC: 9.3 10*3/uL (ref 3.4–10.8)

## 2016-11-30 LAB — URINALYSIS, ROUTINE W REFLEX MICROSCOPIC
Bilirubin, UA: NEGATIVE
Glucose, UA: NEGATIVE
Ketones, UA: NEGATIVE
Leukocytes, UA: NEGATIVE
Nitrite, UA: NEGATIVE
PH UA: 6.5 (ref 5.0–7.5)
PROTEIN UA: NEGATIVE
RBC, UA: NEGATIVE
Specific Gravity, UA: 1.016 (ref 1.005–1.030)
UUROB: 0.2 mg/dL (ref 0.2–1.0)

## 2016-11-30 LAB — LIPID PANEL
CHOL/HDL RATIO: 3.7 ratio (ref 0.0–4.4)
Cholesterol, Total: 172 mg/dL (ref 100–199)
HDL: 46 mg/dL (ref >39)
LDL CALC: 91 mg/dL (ref 0–99)
TRIGLYCERIDES: 173 mg/dL — AB (ref 0–149)
VLDL Cholesterol Cal: 35 mg/dL (ref 5–40)

## 2016-11-30 LAB — PARATHYROID HORMONE, INTACT (NO CA)

## 2016-11-30 LAB — URINE CULTURE: Organism ID, Bacteria: NO GROWTH

## 2016-12-02 LAB — PARATHYROID HORMONE, INTACT (NO CA): PTH: 16 pg/mL (ref 15–65)

## 2016-12-03 ENCOUNTER — Ambulatory Visit (INDEPENDENT_AMBULATORY_CARE_PROVIDER_SITE_OTHER): Payer: Medicare Other | Admitting: Emergency Medicine

## 2016-12-03 DIAGNOSIS — I2510 Atherosclerotic heart disease of native coronary artery without angina pectoris: Secondary | ICD-10-CM | POA: Diagnosis not present

## 2016-12-03 NOTE — Progress Notes (Signed)
Here for labs only

## 2016-12-05 ENCOUNTER — Other Ambulatory Visit: Payer: Self-pay | Admitting: Family Medicine

## 2016-12-05 DIAGNOSIS — R2241 Localized swelling, mass and lump, right lower limb: Secondary | ICD-10-CM

## 2016-12-08 ENCOUNTER — Encounter: Payer: Self-pay | Admitting: Family Medicine

## 2016-12-13 DIAGNOSIS — M5416 Radiculopathy, lumbar region: Secondary | ICD-10-CM | POA: Diagnosis not present

## 2016-12-13 DIAGNOSIS — M545 Low back pain: Secondary | ICD-10-CM | POA: Diagnosis not present

## 2016-12-14 ENCOUNTER — Other Ambulatory Visit: Payer: Self-pay | Admitting: Orthopedic Surgery

## 2016-12-14 DIAGNOSIS — M545 Low back pain: Secondary | ICD-10-CM

## 2016-12-23 ENCOUNTER — Other Ambulatory Visit: Payer: Medicare Other

## 2016-12-24 ENCOUNTER — Ambulatory Visit
Admission: RE | Admit: 2016-12-24 | Discharge: 2016-12-24 | Disposition: A | Discharge status: Home / Self Care | Payer: Medicare Other | Source: Ambulatory Visit | Attending: Orthopedic Surgery | Admitting: Orthopedic Surgery

## 2016-12-24 ENCOUNTER — Ambulatory Visit
Admission: RE | Admit: 2016-12-24 | Discharge: 2016-12-24 | Disposition: A | Discharge status: Home / Self Care | Payer: Medicare Other | Source: Ambulatory Visit | Attending: Family Medicine | Admitting: Family Medicine

## 2016-12-24 DIAGNOSIS — R2241 Localized swelling, mass and lump, right lower limb: Secondary | ICD-10-CM

## 2016-12-24 DIAGNOSIS — M545 Low back pain: Secondary | ICD-10-CM

## 2016-12-24 DIAGNOSIS — M5127 Other intervertebral disc displacement, lumbosacral region: Secondary | ICD-10-CM | POA: Diagnosis not present

## 2016-12-24 DIAGNOSIS — M25461 Effusion, right knee: Secondary | ICD-10-CM | POA: Diagnosis not present

## 2016-12-27 DIAGNOSIS — M545 Low back pain: Secondary | ICD-10-CM | POA: Diagnosis not present

## 2017-02-15 ENCOUNTER — Other Ambulatory Visit: Payer: Self-pay | Admitting: Family Medicine

## 2017-02-15 DIAGNOSIS — R921 Mammographic calcification found on diagnostic imaging of breast: Secondary | ICD-10-CM

## 2017-03-06 ENCOUNTER — Encounter: Payer: Self-pay | Admitting: Cardiovascular Disease

## 2017-03-06 ENCOUNTER — Ambulatory Visit (INDEPENDENT_AMBULATORY_CARE_PROVIDER_SITE_OTHER): Payer: Medicare Other | Admitting: Cardiovascular Disease

## 2017-03-06 VITALS — BP 142/72 | HR 75 | Ht 62.75 in | Wt 174.4 lb

## 2017-03-06 DIAGNOSIS — I2510 Atherosclerotic heart disease of native coronary artery without angina pectoris: Secondary | ICD-10-CM

## 2017-03-06 DIAGNOSIS — I10 Essential (primary) hypertension: Secondary | ICD-10-CM

## 2017-03-06 DIAGNOSIS — E6609 Other obesity due to excess calories: Secondary | ICD-10-CM

## 2017-03-06 DIAGNOSIS — Z6831 Body mass index (BMI) 31.0-31.9, adult: Secondary | ICD-10-CM | POA: Diagnosis not present

## 2017-03-06 DIAGNOSIS — E7800 Pure hypercholesterolemia, unspecified: Secondary | ICD-10-CM

## 2017-03-06 DIAGNOSIS — I350 Nonrheumatic aortic (valve) stenosis: Secondary | ICD-10-CM

## 2017-03-06 NOTE — Patient Instructions (Signed)
Dr Croitoru recommends that you schedule a follow-up appointment in 12 months. You will receive a reminder letter in the mail two months in advance. If you don't receive a letter, please call our office to schedule the follow-up appointment.  If you need a refill on your cardiac medications before your next appointment, please call your pharmacy. 

## 2017-03-06 NOTE — Progress Notes (Signed)
Cardiology Office Note    Date:  03/06/2017   ID:  Sierra Carpenter, Sierra Carpenter 1945/11/04, MRN [id number]  PCP:  Sierra Honour, MD  Cardiologist:   Sierra Klein, MD   No chief complaint: AS follow up   History of Present Illness:  Sierra Carpenter is a 71 y.o. female with coronary artery disease, hypertension and hyperlipidemia.  The patient specifically denies any chest pain at rest exertion, dyspnea at rest or with exertion, orthopnea, paroxysmal nocturnal dyspnea, syncope, palpitations, focal neurological deficits, intermittent claudication, lower extremity edema, unexplained weight gain, cough, hemoptysis or wheezing.  She has been a widow now for 4 years and has also recently lost her last brother.  She remains physically active, taking care of her 1 acre lawn, trimming, leaf blowing, sweeping/mopping do not cause any shortness of breath or chest pain or dizziness.  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  In 2000 she had coronary angiography that showed minor coronary artery disease (50% ostial first diagonal artery, 30% proximal RCA).  She had a normal stress test in November 2018.  Also November 2017 her echo showed moderate aortic stenosis with an estimated valve area of around 1.3 cm and a mean gradient of 28 mmHg.  She has normal left ventricular systolic function.  Past Medical History:  Diagnosis Date  . Aortic valve sclerosis   . Coronary atherosclerosis    without significant obstruction  . Heart murmur   . History of nuclear stress test 07/12/2010   bruce protocol myoview; normal pattern of perfusion, low risk, post-stress EF 92%  . Hyperlipidemia   . Hypertension     Past Surgical History:  Procedure Laterality Date  . ABDOMINAL HYSTERECTOMY     fibroids; ovaries intact.  . APPENDECTOMY    . CARDIAC CATHETERIZATION     moderate stenosis in 1st diagonal & RCA  . CESAREAN SECTION    . CHOLECYSTECTOMY    . TRANSTHORACIC  ECHOCARDIOGRAM  07/12/2010   EF=>55% with normal systolic function; trace MR/TR/PR    Current Medications: Outpatient Medications Prior to Visit  Medication Sig Dispense Refill  . albuterol (PROVENTIL HFA;VENTOLIN HFA) 108 (90 Base) MCG/ACT inhaler Inhale 2 puffs into the lungs every 4 (four) hours as needed for wheezing or shortness of breath (cough, shortness of breath or wheezing.). 1 Inhaler 1  . Ascorbic Acid (VITAMIN C PO) Take by mouth daily.    Marland Kitchen aspirin 81 MG tablet Take 1 tablet (81 mg total) by mouth daily. 1 tablet   . atorvastatin (LIPITOR) 40 MG tablet Take 1 tablet (40 mg total) by mouth daily. 90 tablet 3  . Calcium Carb-Cholecalciferol (CALCIUM 600+D3) 600-200 MG-UNIT TABS Take by mouth 2 (two) times daily.    Marland Kitchen co-enzyme Q-10 30 MG capsule Take 100 mg by mouth 2 (two) times daily.    . fish oil-omega-3 fatty acids 1000 MG capsule Take 1 capsule by mouth 3 (three) times daily.    Marland Kitchen losartan-hydrochlorothiazide (HYZAAR) 50-12.5 MG tablet Take 1 tablet by mouth daily. 90 tablet 3  . meloxicam (MOBIC) 7.5 MG tablet Take 1 tablet (7.5 mg total) by mouth daily. (Patient taking differently: Take by mouth every other day. Pt takes o.5 tablet) 90 tablet 0  . Multiple Vitamins-Minerals (PRESERVISION AREDS 2 PO) Take 1 tablet by mouth daily.      No facility-administered medications prior to visit.      Allergies:   Patient has no known allergies.  Social History   Socioeconomic History  . Marital status: Married    Spouse name: None  . Number of children: 1  . Years of education: 1  . Highest education level: None  Social Needs  . Financial resource strain: None  . Food insecurity - worry: None  . Food insecurity - inability: None  . Transportation needs - medical: None  . Transportation needs - non-medical: None  Occupational History  . Occupation: retired    Comment: Production designer, theatre/television/film  Tobacco Use  . Smoking status: Never Smoker  . Smokeless tobacco: Never Used    Substance and Sexual Activity  . Alcohol use: No  . Drug use: No  . Sexual activity: Yes  Other Topics Concern  . None  Social History Narrative   Marital status: widowed since 2014 due to COPD; married x 37 years.      Children: 1 daughter; 1 son died of CHF age 70 pacemaker.  4 grandchildren.      Lives: alone      Employment: retired at age 32 from [employer].      Tobacco:  never       Alcohol:  never      Exercise:  None in 2017; was walking 2 miles daily.  DDD lumbar limiting walking.      ADLs: independent with ADLs; drives.        Advanced Directives: none; has paper.  FULL CODE; no prolonged measures.  HCPOA: daughter/Sierra Carpenter.     Family History:  The patient's family history includes Alzheimer's disease in her mother; COPD in her brother; Heart disease in her brother, brother, brother, paternal grandfather, and son; Heart disease (age of onset: 70) in her father.   ROS:   Please see the history of present illness.    ROS All other systems reviewed and are negative.   PHYSICAL EXAM:   VS:  BP (!) 142/72   Pulse 75   Ht 5' 2.75" (1.594 m)   Wt 174 lb 6.4 oz (79.1 kg)   BMI 31.14 kg/m    Recheck BP 130/70 mmHg GEN: Well nourished, well developed, in no acute distress  HEENT: normal  Neck: no JVD, carotid bruits, or masses Cardiac: RRR; 3/6 aortic ejection murmur radiating towards the neck, no diastolic murmurs, rubs, or gallops,no edema  Respiratory:  clear to auscultation bilaterally, normal work of breathing GI: soft, nontender, nondistended, + BS MS: no deformity or atrophy  Skin: warm and dry, no rash Neuro:  Alert and Oriented x 3, Strength and sensation are intact Psych: euthymic mood, full affect  Wt Readings from Last 3 Encounters:  03/06/17 174 lb 6.4 oz (79.1 kg)  11/29/16 172 lb (78 kg)  09/19/16 173 lb (78.5 kg)      Studies/Labs Reviewed:   EKG:  EKG is ordered today.  The ekg ordered today demonstrates Sinus rhythm, poor R-wave  progression with R-wave transition in V5, Q waves in leads III and aVF, not yet meeting criteria for left anterior fascicular block.  No change from previous tracing  Recent Labs: 11/29/2016: ALT 19; BUN 20; Creatinine, Ser 1.15; Hemoglobin 16.9; Platelets 312; Potassium 4.7; Sodium 142   Lipid Panel    Component Value Date/Time   CHOL 172 11/29/2016 0952   TRIG 173 (H) 11/29/2016 0952   HDL 46 11/29/2016 0952   CHOLHDL 3.7 11/29/2016 0952   CHOLHDL 3.2 11/18/2015 1111   VLDL 29 11/18/2015 1111   LDLCALC 91 11/29/2016 9735  ASSESSMENT:    1. Atherosclerosis of native coronary artery of native heart without angina pectoris   2. Non-rheumatic aortic stenosis   3. Essential hypertension   4. Pure hypercholesterolemia   5. Class 1 obesity due to excess calories with serious comorbidity and body mass index (BMI) of 31.0 to 31.9 in adult      PLAN:  In order of problems listed above:  1. CAD: Recent nuclear perfusion imaging showed normal findings.  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  No repolarization abnormalities are seen. 2. AS: Echo 1 year ago showed moderate aortic stenosis.  She is asymptomatic during physical activity.  Encouraged her to promptly report exertional symptoms.  Discussed options for aortic valve replacement either surgical with a biological prosthesis or TAVR, when she becomes symptomatic. 3. HTN: blood pressure slightly elevated on arrival, normal upon recheck:  4. HLP: Extent LDL cholesterol current regimen, HDL has improved.  5. Obesity: Would benefit from additional weight loss. Exercise limited by back pain.   Medication Adjustments/Labs and Tests Ordered: Current medicines are reviewed at length with the patient today.  Concerns regarding medicines are outlined above.  Medication changes, Labs and Tests ordered today are listed in the Patient Instructions below. Patient  Instructions  Dr Sallyanne Kuster recommends that you schedule a follow-up appointment in 12 months. You will receive a reminder letter in the mail two months in advance. If you don't receive a letter, please call our office to schedule the follow-up appointment.  If you need a refill on your cardiac medications before your next appointment, please call your pharmacy.    Signed, Sierra Klein, MD  03/06/2017 10:09 AM    [facility] [place], [place], [place]  [address] Phone: [phone]; Fax: [phone]

## 2017-03-06 NOTE — Addendum Note (Signed)
Addended by: Zebedee Iba on: 03/06/2017 11:02 AM   Modules accepted: Orders

## 2017-04-26 ENCOUNTER — Ambulatory Visit
Admission: RE | Admit: 2017-04-26 | Discharge: 2017-04-26 | Disposition: A | Discharge status: Home / Self Care | Payer: Medicare Other | Source: Ambulatory Visit | Attending: Family Medicine | Admitting: Family Medicine

## 2017-04-26 DIAGNOSIS — R921 Mammographic calcification found on diagnostic imaging of breast: Secondary | ICD-10-CM

## 2017-06-08 DIAGNOSIS — D1801 Hemangioma of skin and subcutaneous tissue: Secondary | ICD-10-CM | POA: Diagnosis not present

## 2017-06-08 DIAGNOSIS — L814 Other melanin hyperpigmentation: Secondary | ICD-10-CM | POA: Diagnosis not present

## 2017-06-08 DIAGNOSIS — D225 Melanocytic nevi of trunk: Secondary | ICD-10-CM | POA: Diagnosis not present

## 2017-06-08 DIAGNOSIS — D220 Melanocytic nevi of lip: Secondary | ICD-10-CM | POA: Diagnosis not present

## 2017-06-08 DIAGNOSIS — D2339 Other benign neoplasm of skin of other parts of face: Secondary | ICD-10-CM | POA: Diagnosis not present

## 2017-06-08 DIAGNOSIS — L570 Actinic keratosis: Secondary | ICD-10-CM | POA: Diagnosis not present

## 2017-06-27 DIAGNOSIS — H04123 Dry eye syndrome of bilateral lacrimal glands: Secondary | ICD-10-CM | POA: Diagnosis not present

## 2017-06-27 DIAGNOSIS — H353131 Nonexudative age-related macular degeneration, bilateral, early dry stage: Secondary | ICD-10-CM | POA: Diagnosis not present

## 2017-06-27 DIAGNOSIS — H2513 Age-related nuclear cataract, bilateral: Secondary | ICD-10-CM | POA: Diagnosis not present

## 2017-06-27 DIAGNOSIS — H53002 Unspecified amblyopia, left eye: Secondary | ICD-10-CM | POA: Diagnosis not present

## 2017-06-28 ENCOUNTER — Other Ambulatory Visit: Payer: Self-pay | Admitting: Cardiovascular Disease

## 2017-06-28 NOTE — Telephone Encounter (Signed)
Pt calling stating that she needs a refill on losartan-HCTZ, sent to [facility]. Please address

## 2017-06-29 MED ORDER — LOSARTAN POTASSIUM-HCTZ 50-12.5 MG PO TABS: 1.0000 | ORAL_TABLET | Freq: Every day | ORAL | 2 refills | Status: DC

## 2017-08-07 ENCOUNTER — Encounter: Payer: Self-pay | Admitting: Family Medicine

## 2017-08-12 ENCOUNTER — Ambulatory Visit (INDEPENDENT_AMBULATORY_CARE_PROVIDER_SITE_OTHER): Payer: Medicare Other

## 2017-08-12 ENCOUNTER — Encounter: Payer: Self-pay | Admitting: Family Medicine

## 2017-08-12 ENCOUNTER — Ambulatory Visit (INDEPENDENT_AMBULATORY_CARE_PROVIDER_SITE_OTHER): Payer: Medicare Other | Admitting: Family Medicine

## 2017-08-12 ENCOUNTER — Other Ambulatory Visit: Payer: Self-pay

## 2017-08-12 VITALS — BP 130/72 | HR 77 | Temp 98.0°F | Resp 16 | Ht 60.43 in | Wt 155.0 lb

## 2017-08-12 DIAGNOSIS — Z0000 Encounter for general adult medical examination without abnormal findings: Secondary | ICD-10-CM | POA: Diagnosis not present

## 2017-08-12 DIAGNOSIS — I10 Essential (primary) hypertension: Secondary | ICD-10-CM

## 2017-08-12 DIAGNOSIS — N3941 Urge incontinence: Secondary | ICD-10-CM | POA: Diagnosis not present

## 2017-08-12 DIAGNOSIS — M25511 Pain in right shoulder: Secondary | ICD-10-CM | POA: Diagnosis not present

## 2017-08-12 DIAGNOSIS — E7800 Pure hypercholesterolemia, unspecified: Secondary | ICD-10-CM

## 2017-08-12 DIAGNOSIS — I350 Nonrheumatic aortic (valve) stenosis: Secondary | ICD-10-CM

## 2017-08-12 DIAGNOSIS — D751 Secondary polycythemia: Secondary | ICD-10-CM | POA: Diagnosis not present

## 2017-08-12 DIAGNOSIS — R739 Hyperglycemia, unspecified: Secondary | ICD-10-CM

## 2017-08-12 DIAGNOSIS — I2510 Atherosclerotic heart disease of native coronary artery without angina pectoris: Secondary | ICD-10-CM | POA: Diagnosis not present

## 2017-08-12 LAB — POCT URINALYSIS DIP (MANUAL ENTRY)
Bilirubin, UA: NEGATIVE
GLUCOSE UA: NEGATIVE mg/dL
Nitrite, UA: NEGATIVE
Protein Ur, POC: NEGATIVE mg/dL
RBC UA: NEGATIVE
Spec Grav, UA: 1.02 (ref 1.010–1.025)
UROBILINOGEN UA: 0.2 U/dL
pH, UA: 6.5 (ref 5.0–8.0)

## 2017-08-12 MED ORDER — MELOXICAM 7.5 MG PO TABS: 7.5000 mg | ORAL_TABLET | Freq: Every day | ORAL | 0 refills | Status: DC

## 2017-08-12 MED ORDER — OXYBUTYNIN CHLORIDE 5 MG PO TABS: 5.0000 mg | ORAL_TABLET | Freq: Two times a day (BID) | ORAL | 5 refills | Status: DC

## 2017-08-12 MED ORDER — ATORVASTATIN CALCIUM 40 MG PO TABS: 40.0000 mg | ORAL_TABLET | Freq: Every day | ORAL | 3 refills | Status: DC

## 2017-08-12 NOTE — Patient Instructions (Addendum)
DECREASE CALCIUM-D TO ONCE DAILY. BRING IN COPY OF LIVING WILL. PERFORM SHOULDER EXERCISES DAILY. CONGRATULATIONS ON WEIGHT LOSS!!!!!!!  I AM SO PROUD OF YOU!  IF you received an x-ray today, you will receive an invoice from [facility]. Please contact [facility] at [phone] with questions or concerns regarding your invoice.   IF you received labwork today, you will receive an invoice from [facility]. Please contact [facility] at [phone] with questions or concerns regarding your invoice.   Our billing staff will not be able to assist you with questions regarding bills from these companies.  You will be contacted with the lab results as soon as they are available. The fastest way to get your results is to activate your My Chart account. Instructions are located on the last page of this paperwork. If you have not heard from [place] regarding the results in 2 weeks, please contact this office.      Shoulder Exercises Ask your health care provider which exercises are safe for you. Do exercises exactly as told by your health care provider and adjust them as directed. It is normal to feel mild stretching, pulling, tightness, or discomfort as you do these exercises, but you should stop right away if you feel sudden pain or your pain gets worse.Do not begin these exercises until told by your health care provider. RANGE OF MOTION EXERCISES These exercises warm up your muscles and joints and improve the movement and flexibility of your shoulder. These exercises also help to relieve pain, numbness, and tingling. These exercises involve stretching your injured shoulder directly. Exercise A: Pendulum  1. Stand near a wall or a surface that you can hold onto for balance. 2. Bend at the waist and let your left / right arm hang straight down. Use your other arm to support you. Keep your back straight and do not lock your knees. 3. Relax your left / right arm and shoulder muscles, and  move your hips and your trunk so your left / right arm swings freely. Your arm should swing because of the motion of your body, not because you are using your arm or shoulder muscles. 4. Keep moving your body so your arm swings in the following directions, as told by your health care provider: ? Side to side. ? Forward and backward. ? In clockwise and counterclockwise circles. 5. Continue each motion for __________ seconds, or for as long as told by your health care provider. 6. Slowly return to the starting position. Repeat __________ times. Complete this exercise __________ times a day. Exercise B:Flexion, Standing  1. Stand and hold a broomstick, a cane, or a similar object. Place your hands a little more than shoulder-width apart on the object. Your left / right hand should be palm-up, and your other hand should be palm-down. 2. Keep your elbow straight and keep your shoulder muscles relaxed. Push the stick down with your healthy arm to raise your left / right arm in front of your body, and then over your head until you feel a stretch in your shoulder. ? Avoid shrugging your shoulder while you raise your arm. Keep your shoulder blade tucked down toward the middle of your back. 3. Hold for __________ seconds. 4. Slowly return to the starting position. Repeat __________ times. Complete this exercise __________ times a day. Exercise C: Abduction, Standing 1. Stand and hold a broomstick, a cane, or a similar object. Place your hands a little more than shoulder-width apart on the object. Your left / right  hand should be palm-up, and your other hand should be palm-down. 2. While keeping your elbow straight and your shoulder muscles relaxed, push the stick across your body toward your left / right side. Raise your left / right arm to the side of your body and then over your head until you feel a stretch in your shoulder. ? Do not raise your arm above shoulder height, unless your health care provider  tells you to do that. ? Avoid shrugging your shoulder while you raise your arm. Keep your shoulder blade tucked down toward the middle of your back. 3. Hold for __________ seconds. 4. Slowly return to the starting position. Repeat __________ times. Complete this exercise __________ times a day. Exercise D:Internal Rotation  1. Place your left / right hand behind your back, palm-up. 2. Use your other hand to dangle an exercise band, a towel, or a similar object over your shoulder. Grasp the band with your left / right hand so you are holding onto both ends. 3. Gently pull up on the band until you feel a stretch in the front of your left / right shoulder. ? Avoid shrugging your shoulder while you raise your arm. Keep your shoulder blade tucked down toward the middle of your back. 4. Hold for __________ seconds. 5. Release the stretch by letting go of the band and lowering your hands. Repeat __________ times. Complete this exercise __________ times a day. STRETCHING EXERCISES These exercises warm up your muscles and joints and improve the movement and flexibility of your shoulder. These exercises also help to relieve pain, numbness, and tingling. These exercises are done using your healthy shoulder to help stretch the muscles of your injured shoulder. Exercise E: Warehouse manager (External Rotation and Abduction)  1. Stand in a doorway with one of your feet slightly in front of the other. This is called a staggered stance. If you cannot reach your forearms to the door frame, stand facing a corner of a room. 2. Choose one of the following positions as told by your health care provider: ? Place your hands and forearms on the door frame above your head. ? Place your hands and forearms on the door frame at the height of your head. ? Place your hands on the door frame at the height of your elbows. 3. Slowly move your weight onto your front foot until you feel a stretch across your chest and in the  front of your shoulders. Keep your head and chest upright and keep your abdominal muscles tight. 4. Hold for __________ seconds. 5. To release the stretch, shift your weight to your back foot. Repeat __________ times. Complete this stretch __________ times a day. Exercise F:Extension, Standing 1. Stand and hold a broomstick, a cane, or a similar object behind your back. ? Your hands should be a little wider than shoulder-width apart. ? Your palms should face away from your back. 2. Keeping your elbows straight and keeping your shoulder muscles relaxed, move the stick away from your body until you feel a stretch in your shoulder. ? Avoid shrugging your shoulders while you move the stick. Keep your shoulder blade tucked down toward the middle of your back. 3. Hold for __________ seconds. 4. Slowly return to the starting position. Repeat __________ times. Complete this exercise __________ times a day. STRENGTHENING EXERCISES These exercises build strength and endurance in your shoulder. Endurance is the ability to use your muscles for a long time, even after they get tired. Exercise G:External Rotation  1. Sit in a stable chair without armrests. 2. Secure an exercise band at elbow height on your left / right side. 3. Place a soft object, such as a folded towel or a small pillow, between your left / right upper arm and your body to move your elbow a few inches away (about 10 cm) from your side. 4. Hold the end of the band so it is tight and there is no slack. 5. Keeping your elbow pressed against the soft object, move your left / right forearm out, away from your abdomen. Keep your body steady so only your forearm moves. 6. Hold for __________ seconds. 7. Slowly return to the starting position. Repeat __________ times. Complete this exercise __________ times a day. Exercise H:Shoulder Abduction  1. Sit in a stable chair without armrests, or stand. 2. Hold a __________ weight in your left /  right hand, or hold an exercise band with both hands. 3. Start with your arms straight down and your left / right palm facing in, toward your body. 4. Slowly lift your left / right hand out to your side. Do not lift your hand above shoulder height unless your health care provider tells you that this is safe. ? Keep your arms straight. ? Avoid shrugging your shoulder while you do this movement. Keep your shoulder blade tucked down toward the middle of your back. 5. Hold for __________ seconds. 6. Slowly lower your arm, and return to the starting position. Repeat __________ times. Complete this exercise __________ times a day. Exercise I:Shoulder Extension 1. Sit in a stable chair without armrests, or stand. 2. Secure an exercise band to a stable object in front of you where it is at shoulder height. 3. Hold one end of the exercise band in each hand. Your palms should face each other. 4. Straighten your elbows and lift your hands up to shoulder height. 5. Step back, away from the secured end of the exercise band, until the band is tight and there is no slack. 6. Squeeze your shoulder blades together as you pull your hands down to the sides of your thighs. Stop when your hands are straight down by your sides. Do not let your hands go behind your body. 7. Hold for __________ seconds. 8. Slowly return to the starting position. Repeat __________ times. Complete this exercise __________ times a day. Exercise J:Standing Shoulder Row 1. Sit in a stable chair without armrests, or stand. 2. Secure an exercise band to a stable object in front of you so it is at waist height. 3. Hold one end of the exercise band in each hand. Your palms should be in a thumbs-up position. 4. Bend each of your elbows to an "L" shape (about 90 degrees) and keep your upper arms at your sides. 5. Step back until the band is tight and there is no slack. 6. Slowly pull your elbows back behind you. 7. Hold for __________  seconds. 8. Slowly return to the starting position. Repeat __________ times. Complete this exercise __________ times a day. Exercise K:Shoulder Press-Ups  1. Sit in a stable chair that has armrests. Sit upright, with your feet flat on the floor. 2. Put your hands on the armrests so your elbows are bent and your fingers are pointing forward. Your hands should be about even with the sides of your body. 3. Push down on the armrests and use your arms to lift yourself off of the chair. Straighten your elbows and lift yourself up as much  as you comfortably can. ? Move your shoulder blades down, and avoid letting your shoulders move up toward your ears. ? Keep your feet on the ground. As you get stronger, your feet should support less of your body weight as you lift yourself up. 4. Hold for __________ seconds. 5. Slowly lower yourself back into the chair. Repeat __________ times. Complete this exercise __________ times a day. Exercise L: Wall Push-Ups  1. Stand so you are facing a stable wall. Your feet should be about one arm-length away from the wall. 2. Lean forward and place your palms on the wall at shoulder height. 3. Keep your feet flat on the floor as you bend your elbows and lean forward toward the wall. 4. Hold for __________ seconds. 5. Straighten your elbows to push yourself back to the starting position. Repeat __________ times. Complete this exercise __________ times a day. This information is not intended to replace advice given to you by your health care provider. Make sure you discuss any questions you have with your health care provider. Document Released: 02/02/2005 Document Revised: 12/14/2015 Document Reviewed: 11/30/2014 Elsevier Interactive Patient Education  2018 [employer].

## 2017-08-12 NOTE — Progress Notes (Signed)
Subjective:    Patient ID: Mendi J Peavler, female    DOB: 04-04-1946, 72 y.o.   MRN: [id number]  08/12/2017  Medicare Wellness    HPI This 72 y.o. female presents for [place] and follow-up of chronic medical conditions.  Last physical:  06-01-2016 Pap smear:  hysterectomy Mammogram: 04-2017 Colonoscopy: Bone density: 12-2015 Eye exam: Dental exam:   Visual Acuity Screening   Right eye Left eye Both eyes  Without correction: 20/30  20/25  With correction:       BP Readings from Last 3 Encounters:  08/12/17 130/72  03/06/17 (!) 142/72  11/29/16 126/72   Wt Readings from Last 3 Encounters:  08/12/17 155 lb (70.3 kg)  03/06/17 174 lb 6.4 oz (79.1 kg)  11/29/16 172 lb (78 kg)   Immunization History  Administered Date(s) Administered  . Influenza Split 12/08/2012  . Influenza,inj,Quad PF,6+ Mos 12/18/2014, 11/29/2016  . Influenza-Unspecified 12/10/2013, 11/30/2015, 06/01/2016  . Pneumococcal Conjugate-13 12/10/2013  . Pneumococcal Polysaccharide-23 12/18/2014  . Tdap 08/07/2010   Health Maintenance  Topic Date Due  . INFLUENZA VACCINE  11/02/2017  . MAMMOGRAM  10/14/2018  . COLONOSCOPY  02/05/2020  . TETANUS/TDAP  08/06/2020  . DEXA SCAN  Completed  . Hepatitis C Screening  Completed  . PNA vac Low Risk Adult  Completed   Management changes made last visit include the following: -New onset R knee cyst; obtain knee xray; refer to ortho. -worsening lower back pain; repeat lumbar spine imaging as has been several years since has been imaged; due to worsening pain, refer back to ortho for consultation; may be a candidate for injections. -new onset breast calcifications on mammography; to follow closely with repeat imaging in six months. -new onset urge incontinence; send urine cx to rule out UTI with recent and sudden onset of symptoms. -obtain labs for chronic disease management. -new onset hypercalcemia with elevated LFTs; repeat labs today.   Obtain PTH intact. -prolonged face-to-face for 40 minutes with greater than 50% of time dedicated to counseling and coordination of care.    HTN: Patient reports good compliance with medication, good tolerance to medication, and good symptom control.    Hypercholesterolemia: Patient reports good compliance with medication, good tolerance to medication, and good symptom control.    DDD lumbar: Mobic sparingly; gets help from neighbors and family and friends.   Requesting excuse from jury duty.  Prescribed back brace.  Cannot sit with back brace.  Urinary incontinence: excessive leakage.  Urgency.  Urine cx negative in 11/2017.  R knee mass: no surgical indication.  Return if develops pain or enlarging.  R SHOULDER PAIN: with movements above head.  Onset three months ago. No injury.  Hypercalcemia: taking Vitamin D; 800IU twice daily.  Review of Systems  Constitutional: Negative for activity change, appetite change, chills, diaphoresis, fatigue, fever and unexpected weight change.  HENT: Negative for congestion, dental problem, drooling, ear discharge, ear pain, facial swelling, hearing loss, mouth sores, nosebleeds, postnasal drip, rhinorrhea, sinus pressure, sneezing, sore throat, tinnitus, trouble swallowing and voice change.   Eyes: Negative for photophobia, pain, discharge, redness, itching and visual disturbance.  Respiratory: Negative for apnea, cough, choking, chest tightness, shortness of breath, wheezing and stridor.   Cardiovascular: Negative for chest pain, palpitations and leg swelling.  Gastrointestinal: Positive for diarrhea. Negative for abdominal distention, abdominal pain, anal bleeding, blood in stool, constipation, nausea, rectal pain and vomiting.  Endocrine: Positive for polyuria. Negative for cold intolerance, heat intolerance, polydipsia and polyphagia.  Genitourinary: Positive for frequency and urgency. Negative for decreased urine volume, difficulty urinating,  dyspareunia, dysuria, enuresis, flank pain, genital sores, hematuria, menstrual problem, pelvic pain, vaginal bleeding, vaginal discharge and vaginal pain.       Nocturia x 0-3.  Urinary leakage.  Musculoskeletal: Positive for arthralgias and back pain. Negative for gait problem, joint swelling, myalgias, neck pain and neck stiffness.  Skin: Negative for color change, pallor, rash and wound.  Allergic/Immunologic: Negative for environmental allergies, food allergies and immunocompromised state.  Neurological: Negative for dizziness, tremors, seizures, syncope, facial asymmetry, speech difficulty, weakness, light-headedness, numbness and headaches.  Hematological: Negative for adenopathy. Does not bruise/bleed easily.  Psychiatric/Behavioral: Negative for agitation, behavioral problems, confusion, decreased concentration, dysphoric mood, hallucinations, self-injury, sleep disturbance and suicidal ideas. The patient is not nervous/anxious and is not hyperactive.        Bedtime 900; wakes up 500.    Past Medical History:  Diagnosis Date  . Aortic valve sclerosis   . Coronary atherosclerosis    without significant obstruction  . Heart murmur   . History of nuclear stress test 07/12/2010   bruce protocol myoview; normal pattern of perfusion, low risk, post-stress EF 92%  . Hyperlipidemia   . Hypertension    Past Surgical History:  Procedure Laterality Date  . ABDOMINAL HYSTERECTOMY     fibroids; ovaries intact.  . APPENDECTOMY    . CARDIAC CATHETERIZATION     moderate stenosis in 1st diagonal & RCA  . CESAREAN SECTION    . CHOLECYSTECTOMY    . TRANSTHORACIC ECHOCARDIOGRAM  07/12/2010   EF=>55% with normal systolic function; trace MR/TR/PR   No Known Allergies Current Outpatient Medications on File Prior to Visit  Medication Sig Dispense Refill  . Ascorbic Acid (VITAMIN C PO) Take by mouth daily.    Marland Kitchen aspirin 81 MG tablet Take 1 tablet (81 mg total) by mouth daily. 1 tablet   . Calcium  Carb-Cholecalciferol (CALCIUM 600+D3) 600-200 MG-UNIT TABS Take by mouth 2 (two) times daily.    Marland Kitchen co-enzyme Q-10 30 MG capsule Take 100 mg by mouth 2 (two) times daily.    . fish oil-omega-3 fatty acids 1000 MG capsule Take 1 capsule by mouth 3 (three) times daily.    Marland Kitchen losartan-hydrochlorothiazide (HYZAAR) 50-12.5 MG tablet Take 1 tablet by mouth daily. 90 tablet 2  . Multiple Vitamins-Minerals (PRESERVISION AREDS 2 PO) Take 1 tablet by mouth daily.      No current facility-administered medications on file prior to visit.    Social History   Socioeconomic History  . Marital status: Widowed    Spouse name: Not on file  . Number of children: 1  . Years of education: 23  . Highest education level: Not on file  Occupational History  . Occupation: retired    Comment: Pensions consultant and [employer]  Social Needs  . Financial resource strain: Not on file  . Food insecurity:    Worry: Not on file    Inability: Not on file  . Transportation needs:    Medical: Not on file    Non-medical: Not on file  Tobacco Use  . Smoking status: Never Smoker  . Smokeless tobacco: Never Used  Substance and Sexual Activity  . Alcohol use: No  . Drug use: No  . Sexual activity: Yes    Birth control/protection: Post-menopausal, Surgical  Lifestyle  . Physical activity:    Days per week: Not on file    Minutes per session: Not on file  . Stress:  Not on file  Relationships  . Social connections:    Talks on phone: Not on file    Gets together: Not on file    Attends religious service: Not on file    Active member of club or organization: Not on file    Attends meetings of clubs or organizations: Not on file    Relationship status: Not on file  . Intimate partner violence:    Fear of current or ex partner: Not on file    Emotionally abused: Not on file    Physically abused: Not on file    Forced sexual activity: Not on file  Other Topics Concern  . Not on file  Social History Narrative   Marital  status: widowed since 2014 due to COPD; married x 37 years.      Children: 1 daughter; 1 son died of CHF age 104 pacemaker.  4 grandchildren.      Lives: alone      Employment: retired at age 70 from [employer].      Tobacco:  never       Alcohol:  never      Exercise: yes in 2019; walking 1-2 miles daily.  DDD lumbar limiting walking.      ADLs: independent with ADLs; drives.        Advanced Directives: none; has paper.  FULL CODE; no prolonged measures.  HCPOA: daughter/Misty Wenn-Man[place].   Family History  Problem Relation Age of Onset  . Heart disease Father 15       AMI  . Heart disease Brother   . Heart disease Son   . Heart disease Paternal Grandfather   . Alzheimer's disease Mother   . Heart disease Brother        CHF/CAD  . COPD Brother   . Heart disease Brother   . Breast cancer Neg Hx        Objective:    BP 130/72   Pulse 77   Temp 98 F (36.7 C) (Oral)   Resp 16   Ht 5' 0.43" (1.535 m)   Wt 155 lb (70.3 kg)   SpO2 96%   BMI 29.84 kg/m  Physical Exam  Constitutional: She is oriented to person, place, and time. She appears well-developed and well-nourished. No distress.  HENT:  Head: Normocephalic and atraumatic.  Right Ear: External ear normal.  Left Ear: External ear normal.  Nose: Nose normal.  Mouth/Throat: Oropharynx is clear and moist.  Eyes: Pupils are equal, round, and reactive to light. Conjunctivae and EOM are normal.  Neck: Normal range of motion and full passive range of motion without pain. Neck supple. No JVD present. Carotid bruit is not present. No thyromegaly present.  Cardiovascular: Normal rate, regular rhythm, normal heart sounds and intact distal pulses. Exam reveals no gallop and no friction rub.  No murmur heard. Pulmonary/Chest: Effort normal and breath sounds normal. She has no wheezes. She has no rales. Right breast exhibits no inverted nipple, no mass, no nipple discharge, no skin change and no tenderness. Left breast exhibits  no inverted nipple, no mass, no nipple discharge, no skin change and no tenderness. No breast swelling, tenderness, discharge or bleeding. Breasts are symmetrical.  Abdominal: Soft. Bowel sounds are normal. She exhibits no distension and no mass. There is no tenderness. There is no rebound and no guarding.  Musculoskeletal:       Right shoulder: Normal.       Left shoulder: Normal.  Cervical back: Normal.  Lymphadenopathy:    She has no cervical adenopathy.  Neurological: She is alert and oriented to person, place, and time. She has normal reflexes. No cranial nerve deficit. She exhibits normal muscle tone. Coordination normal.  Skin: Skin is warm and dry. No rash noted. She is not diaphoretic. No erythema. No pallor.  Psychiatric: She has a normal mood and affect. Her behavior is normal. Judgment and thought content normal.  Nursing note and vitals reviewed.  No results found. Depression screen [facility] 2/9 08/12/2017 11/29/2016 09/19/2016 06/06/2016 06/01/2016  Decreased Interest 0 0 0 0 0  Down, Depressed, Hopeless 0 0 0 0 0  PHQ - 2 Score 0 0 0 0 0   Fall Risk  08/12/2017 11/29/2016 09/19/2016 06/06/2016 06/01/2016  Falls in the past year? No No No No No        Assessment & Plan:   1. Encounter for Medicare annual wellness exam   2. Atherosclerosis of native coronary artery of native heart without angina pectoris   3. Essential hypertension   4. Pure hypercholesterolemia   5. Hyperglycemia   6. Hypercalcemia   7. Urge incontinence   8. Non-rheumatic aortic stenosis   9. Polycythemia   10. Pain in joint of right shoulder     -anticipatory guidance provided --- exercise, weight loss, safe driving practices, aspirin 81mg  daily. -obtain age appropriate screening labs and labs for chronic disease management. -moderate fall risk; no evidence of depression; no evidence of hearing loss.  Discussed advanced directives and living will; also discussed end of life issues including code status.    -R shoulder pain: New onset.  Obtain right shoulder films.  Recommend rest, icing, home exercise program.  Meloxicam prescribed. Hypertension: Controlled.  Obtain labs for chronic disease management.  Refills provided. Hypercholesterolemia: Controlled.  Obtain labs, refills provided. Hyperglycemia: I recommend weight loss, exercise, and low-carbohydrate low-sugar food choices. You should AVOID: regular sodas, sweetened tea, fruit juices.  You should LIMIT: breads, pastas, rice, potatoes, and desserts/sweets.  I would recommend limiting your total carbohydrate intake per meal to 45 grams; I would limit your total carbohydrate intake per snack to 30 grams.  I would also have a goal of 60 grams of protein intake per day; this would equal 10-15 grams of protein per meal and 5-10 grams of protein per snack. Hypercalcemia: Repeat today with calcium and intact PTH.  Decrease calcium -D to one daily.  Urge incontinence: Uncontrolled.  Urine culture negative at last visit.  Treat with Ditropan therapy. Coronary artery disease: Asymptomatic.  Followed annually by cardiology.  Last visit in December 2018.   Orders Placed This Encounter  Procedures  . DG Shoulder Right    Standing Status:   Future    Standing Expiration Date:   08/12/2018    Order Specific Question:   Reason for Exam (SYMPTOM  OR DIAGNOSIS REQUIRED)    Answer:   R shoulder pain; elevation above 90 degrees    Order Specific Question:   Preferred imaging location?    Answer:   External  . CBC with Differential/Platelet  . Comprehensive metabolic panel    Order Specific Question:   Has the patient fasted?    Answer:   No  . Lipid panel    Order Specific Question:   Has the patient fasted?    Answer:   No  . Hemoglobin A1c  . Parathyroid hormone, intact (no Ca)  . POCT urinalysis dipstick   Meds ordered this encounter  Medications  . oxybutynin (DITROPAN) 5 MG tablet    Sig: Take 1 tablet (5 mg total) by mouth 2 (two) times daily.     Dispense:  60 tablet    Refill:  5  . atorvastatin (LIPITOR) 40 MG tablet    Sig: Take 1 tablet (40 mg total) by mouth daily.    Dispense:  90 tablet    Refill:  3    Please consider 90 day supplies to promote better adherence  . meloxicam (MOBIC) 7.5 MG tablet    Sig: Take 1 tablet (7.5 mg total) by mouth daily.    Dispense:  90 tablet    Refill:  0    No follow-ups on file.   Kristi Elayne Guerin, M.D. Primary Care at [facility] [address] [place], [place]  [address] [phone] phone [phone] fax

## 2017-08-13 LAB — COMPREHENSIVE METABOLIC PANEL
ALBUMIN: 4.6 g/dL (ref 3.6–4.8)
ALK PHOS: 86 IU/L (ref 39–117)
ALT: 21 IU/L (ref 0–32)
AST: 20 IU/L (ref 0–40)
Albumin/Globulin Ratio: 2 (ref 1.2–2.2)
BUN / CREAT RATIO: 13 (ref 12–28)
BUN: 14 mg/dL (ref 8–27)
Bilirubin Total: 0.4 mg/dL (ref 0.0–1.2)
CALCIUM: 11.3 mg/dL — AB (ref 8.7–10.3)
CO2: 22 mmol/L (ref 20–29)
CREATININE: 1.11 mg/dL — AB (ref 0.57–1.00)
Chloride: 99 mmol/L (ref 96–106)
GFR, EST AFRICAN AMERICAN: 59 mL/min/{1.73_m2} — AB (ref >59)
GFR, EST NON AFRICAN AMERICAN: 51 mL/min/{1.73_m2} — AB (ref >59)
GLOBULIN, TOTAL: 2.3 g/dL (ref 1.5–4.5)
Glucose: 88 mg/dL (ref 65–99)
Potassium: 4.3 mmol/L (ref 3.5–5.2)
SODIUM: 142 mmol/L (ref 134–144)
TOTAL PROTEIN: 6.9 g/dL (ref 6.0–8.5)

## 2017-08-13 LAB — LIPID PANEL
CHOLESTEROL TOTAL: 149 mg/dL (ref 100–199)
Chol/HDL Ratio: 3.2 ratio (ref 0.0–4.4)
HDL: 46 mg/dL (ref >39)
LDL Calculated: 73 mg/dL (ref 0–99)
Triglycerides: 150 mg/dL — ABNORMAL HIGH (ref 0–149)
VLDL CHOLESTEROL CAL: 30 mg/dL (ref 5–40)

## 2017-08-13 LAB — CBC WITH DIFFERENTIAL/PLATELET
Basophils Absolute: 0.1 10*3/uL (ref 0.0–0.2)
Basos: 1 %
EOS (ABSOLUTE): 0.5 10*3/uL — ABNORMAL HIGH (ref 0.0–0.4)
EOS: 6 %
HEMATOCRIT: 48.2 % — AB (ref 34.0–46.6)
HEMOGLOBIN: 16.4 g/dL — AB (ref 11.1–15.9)
Immature Grans (Abs): 0 10*3/uL (ref 0.0–0.1)
Immature Granulocytes: 0 %
LYMPHS ABS: 2.3 10*3/uL (ref 0.7–3.1)
Lymphs: 25 %
MCH: 31.8 pg (ref 26.6–33.0)
MCHC: 34 g/dL (ref 31.5–35.7)
MCV: 94 fL (ref 79–97)
MONOCYTES: 7 %
Monocytes Absolute: 0.6 10*3/uL (ref 0.1–0.9)
Neutrophils Absolute: 5.6 10*3/uL (ref 1.4–7.0)
Neutrophils: 61 %
Platelets: 297 10*3/uL (ref 150–379)
RBC: 5.15 x10E6/uL (ref 3.77–5.28)
RDW: 13.7 % (ref 12.3–15.4)
WBC: 9.1 10*3/uL (ref 3.4–10.8)

## 2017-08-13 LAB — URINE CULTURE

## 2017-08-13 LAB — HEMOGLOBIN A1C
Est. average glucose Bld gHb Est-mCnc: 111 mg/dL
HEMOGLOBIN A1C: 5.5 % (ref 4.8–5.6)

## 2017-08-13 LAB — PARATHYROID HORMONE, INTACT (NO CA): PTH: 14 pg/mL — ABNORMAL LOW (ref 15–65)

## 2017-08-13 NOTE — Telephone Encounter (Signed)
Letter provided at visit on 08/12/17.

## 2017-08-24 ENCOUNTER — Encounter: Payer: Self-pay | Admitting: Family Medicine

## 2017-08-29 ENCOUNTER — Encounter: Payer: Self-pay | Admitting: Family Medicine

## 2017-09-04 ENCOUNTER — Other Ambulatory Visit: Payer: Self-pay | Admitting: Family Medicine

## 2017-09-04 ENCOUNTER — Encounter: Payer: Self-pay | Admitting: Family Medicine

## 2017-09-04 DIAGNOSIS — Z1231 Encounter for screening mammogram for malignant neoplasm of breast: Secondary | ICD-10-CM

## 2017-10-02 ENCOUNTER — Telehealth: Payer: Self-pay | Admitting: Cardiovascular Disease

## 2017-10-02 MED ORDER — VALSARTAN-HYDROCHLOROTHIAZIDE 160-12.5 MG PO TABS: 1.0000 | ORAL_TABLET | Freq: Every day | ORAL | 2 refills | Status: DC

## 2017-10-02 NOTE — Telephone Encounter (Signed)
Consider valsartan hctz 160/12.5 mg.  She should check BP daily for about 2 weeks if she can.

## 2017-10-02 NOTE — Telephone Encounter (Signed)
Patient has been made aware and will check her blood pressure for the next two weeks. She will call back if anything further is needed. Valsartan HCTZ 160/12.5 mg has been sent into [facility].

## 2017-10-02 NOTE — Telephone Encounter (Signed)
NEW MESSAGE   Pt c/o medication issue:  1. Name of Medication: losartan-hydrochlorothiazide (HYZAAR) 50-12.5 MG tablet  2. How are you currently taking this medication (dosage and times per day)?  3. Are you having a reaction (difficulty breathing--STAT)? NO 4. What is your medication issue? Medication on backorder. Can med be changed to another drug?

## 2017-10-02 NOTE — Telephone Encounter (Signed)
Call returned to the patient. She stated that the Hyzaar is on back order indefinitely and now needs a replacement. Message routed to PharmD.

## 2017-10-16 ENCOUNTER — Ambulatory Visit
Admission: RE | Admit: 2017-10-16 | Discharge: 2017-10-16 | Disposition: A | Discharge status: Home / Self Care | Payer: Medicare Other | Source: Ambulatory Visit | Attending: Family Medicine | Admitting: Family Medicine

## 2017-10-16 DIAGNOSIS — Z1231 Encounter for screening mammogram for malignant neoplasm of breast: Secondary | ICD-10-CM | POA: Diagnosis not present

## 2017-10-23 ENCOUNTER — Encounter: Payer: Self-pay | Admitting: Cardiovascular Disease

## 2017-11-05 ENCOUNTER — Other Ambulatory Visit: Payer: Self-pay | Admitting: Family Medicine

## 2018-01-01 DIAGNOSIS — H2513 Age-related nuclear cataract, bilateral: Secondary | ICD-10-CM | POA: Diagnosis not present

## 2018-01-01 DIAGNOSIS — H04123 Dry eye syndrome of bilateral lacrimal glands: Secondary | ICD-10-CM | POA: Diagnosis not present

## 2018-01-01 DIAGNOSIS — H53002 Unspecified amblyopia, left eye: Secondary | ICD-10-CM | POA: Diagnosis not present

## 2018-01-01 DIAGNOSIS — H43392 Other vitreous opacities, left eye: Secondary | ICD-10-CM | POA: Diagnosis not present

## 2018-01-08 DIAGNOSIS — H2512 Age-related nuclear cataract, left eye: Secondary | ICD-10-CM | POA: Diagnosis not present

## 2018-01-10 ENCOUNTER — Other Ambulatory Visit: Payer: Self-pay | Admitting: Cardiovascular Disease

## 2018-01-23 DIAGNOSIS — H2511 Age-related nuclear cataract, right eye: Secondary | ICD-10-CM | POA: Diagnosis not present

## 2018-01-29 DIAGNOSIS — H25811 Combined forms of age-related cataract, right eye: Secondary | ICD-10-CM | POA: Diagnosis not present

## 2018-01-29 DIAGNOSIS — H2511 Age-related nuclear cataract, right eye: Secondary | ICD-10-CM | POA: Diagnosis not present

## 2018-02-08 ENCOUNTER — Other Ambulatory Visit: Payer: Self-pay | Admitting: *Deleted

## 2018-02-08 ENCOUNTER — Other Ambulatory Visit: Payer: Self-pay | Admitting: Family Medicine

## 2018-02-08 MED ORDER — OXYBUTYNIN CHLORIDE 5 MG PO TABS: 5.0000 mg | ORAL_TABLET | Freq: Two times a day (BID) | ORAL | 5 refills | Status: DC

## 2018-02-08 NOTE — Telephone Encounter (Signed)
Copied from [place] [id number]. Topic: Quick Communication - Rx Refill/Question >> Feb 08, 2018 10:49 AM Percell Belt A wrote: Medication: oxybutynin (DITROPAN) 5 MG tablet [[id number]  Has the patient contacted their pharmacy? No. (Agent: If no, request that the patient contact the pharmacy for the refill.) (Agent: If yes, when and what did the pharmacy advise?)  Preferred Pharmacy (with phone number or street name): [place] ([address]), [place] - [place] [phone] (Phone)   Agent: Please be advised that RX refills may take up to 3 business days. We ask that you follow-up with your pharmacy.

## 2018-02-13 ENCOUNTER — Ambulatory Visit: Payer: 59 | Admitting: Family Medicine

## 2018-02-16 ENCOUNTER — Ambulatory Visit (INDEPENDENT_AMBULATORY_CARE_PROVIDER_SITE_OTHER): Payer: Medicare Other | Admitting: Family Medicine

## 2018-02-16 ENCOUNTER — Encounter: Payer: Self-pay | Admitting: Family Medicine

## 2018-02-16 ENCOUNTER — Other Ambulatory Visit: Payer: Self-pay

## 2018-02-16 DIAGNOSIS — R7989 Other specified abnormal findings of blood chemistry: Secondary | ICD-10-CM

## 2018-02-16 DIAGNOSIS — I10 Essential (primary) hypertension: Secondary | ICD-10-CM | POA: Diagnosis not present

## 2018-02-16 DIAGNOSIS — E7800 Pure hypercholesterolemia, unspecified: Secondary | ICD-10-CM

## 2018-02-16 DIAGNOSIS — D751 Secondary polycythemia: Secondary | ICD-10-CM

## 2018-02-16 NOTE — Patient Instructions (Signed)
° ° ° °  If you have lab work done today you will be contacted with your lab results within the next 2 weeks.  If you have not heard from us then please contact us. The fastest way to get your results is to register for My Chart. ° ° °IF you received an x-ray today, you will receive an invoice from [facility]. Please contact [facility] at [phone] with questions or concerns regarding your invoice.  ° °IF you received labwork today, you will receive an invoice from [facility]. Please contact [facility] at [phone] with questions or concerns regarding your invoice.  ° °Our billing staff will not be able to assist you with questions regarding bills from these companies. ° °You will be contacted with the lab results as soon as they are available. The fastest way to get your results is to activate your My Chart account. Instructions are located on the last page of this paperwork. If you have not heard from us regarding the results in 2 weeks, please contact this office. °  ° ° ° °

## 2018-02-16 NOTE — Progress Notes (Signed)
 11/15/20199:37 AM  Sierra Carpenter 06/22/1945, 71 y.o. female [id number]  Chief Complaint  Patient presents with  . 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 female with past medical history significant for HTN, AS, HLP,  Hypercalcemia who presents today for routine followup  Previous PCP Dr Smith Last visit in may 2019  Right knee ganglion cyst - has gone back down since last OV Had recent cataract surgery  Last visit concerns for hypercalcemia with low PTH Patient has since then decreased her calcium and vitamin D Diet very rich in diary products Denies any sx  Had this season's flu vaccine at [facility] in aug 2019  Lab Results  Component Value Date   CALCIUM 11.3 (H) 08/12/2017   Lab Results  Component Value Date   CREATININE 1.11 (H) 08/12/2017   Lab Results  Component Value Date   WBC 9.1 08/12/2017   HGB 16.4 (H) 08/12/2017   HCT 48.2 (H) 08/12/2017   MCV 94 08/12/2017   PLT 297 08/12/2017   Lab Results  Component Value Date   CHOL 149 08/12/2017   HDL 46 08/12/2017   LDLCALC 73 08/12/2017   TRIG 150 (H) 08/12/2017   CHOLHDL 3.2 08/12/2017    Patient Care Team: Santiago, Irma M, MD as PCP - General (Family Medicine) Croitoru, Mihai, MD as Consulting Physician (Cardiology) Groat, Richard Scott, MD as Consulting Physician (Ophthalmology)  Fall Risk  02/16/2018 02/16/2018 08/12/2017 11/29/2016 09/19/2016  Falls in the past year? 0 0 No No No     Depression screen PHQ 2/9 02/16/2018 02/16/2018 08/12/2017  Decreased Interest 0 0 0  Down, Depressed, Hopeless 0 0 0  PHQ - 2 Score 0 0 0    No Known Allergies  Prior to Admission medications   Medication Sig Start Date End Date Taking? Authorizing Provider  Ascorbic Acid (VITAMIN C PO) Take by mouth daily.   Yes [provider]  aspirin 81 MG tablet Take 1 tablet (81 mg total) by mouth daily. 11/29/16  Yes Smith, Kristi M, MD  atorvastatin  (LIPITOR) 40 MG tablet Take 1 tablet (40 mg total) by mouth daily. 08/12/17  Yes Smith, Kristi M, MD  Calcium Carb-Cholecalciferol (CALCIUM 600+D3) 600-200 MG-UNIT TABS Take by mouth 2 (two) times daily.   Yes [provider]  co-enzyme Q-10 30 MG capsule Take 100 mg by mouth 2 (two) times daily.   Yes [provider]  fish oil-omega-3 fatty acids 1000 MG capsule Take 1 capsule by mouth 3 (three) times daily.   Yes [provider]  meloxicam (MOBIC) 7.5 MG tablet Take 1 tablet (7.5 mg total) by mouth daily. 08/12/17  Yes Smith, Kristi M, MD  Multiple Vitamins-Minerals (PRESERVISION AREDS 2 PO) Take 1 tablet by mouth daily.    Yes [provider]  oxybutynin (DITROPAN) 5 MG tablet Take 1 tablet (5 mg total) by mouth 2 (two) times daily. 02/08/18  Yes Santiago, Irma M, MD  valsartan-hydrochlorothiazide (DIOVAN-HCT) 160-12.5 MG tablet TAKE 1 TABLET BY MOUTH ONCE DAILY 01/10/18  Yes Croitoru, Mihai, MD    Past Medical History:  Diagnosis Date  . Aortic valve sclerosis   . Coronary atherosclerosis    without significant obstruction  . Heart murmur   . History of nuclear stress test 07/12/2010   bruce protocol myoview; normal pattern of perfusion, low risk, post-stress EF 92%  . Hyperlipidemia   .   Hypertension     Past Surgical History:  Procedure Laterality Date  . ABDOMINAL HYSTERECTOMY     fibroids; ovaries intact.  . APPENDECTOMY    . CARDIAC CATHETERIZATION     moderate stenosis in 1st diagonal & RCA  . CESAREAN SECTION    . CHOLECYSTECTOMY    . EYE SURGERY    . TRANSTHORACIC ECHOCARDIOGRAM  07/12/2010   EF=>55% with normal systolic function; trace MR/TR/PR    Social History   Tobacco Use  . Smoking status: Never Smoker  . Smokeless tobacco: Never Used  Substance Use Topics  . Alcohol use: No    Family History  Problem Relation Age of Onset  . Heart disease Father 29       AMI  . Heart disease Brother   . Heart disease Son   . Heart  disease Paternal Grandfather   . Alzheimer's disease Mother   . Heart disease Brother        CHF/CAD  . COPD Brother   . Heart disease Brother   . Breast cancer Neg Hx     Review of Systems  Constitutional: Negative for chills and fever.  Respiratory: Negative for cough and shortness of breath.   Cardiovascular: Negative for chest pain, palpitations and leg swelling.  Gastrointestinal: Negative for abdominal pain, nausea and vomiting.     OBJECTIVE:  Blood pressure 128/69, pulse (!) 101, temperature 98 F (36.7 C), temperature source Oral, resp. rate 12, height 5' 1" (1.549 m), weight 164 lb 9.6 oz (74.7 kg), SpO2 96 %. Body mass index is 31.1 kg/m.   Wt Readings from Last 3 Encounters:  02/16/18 164 lb 9.6 oz (74.7 kg)  08/12/17 155 lb (70.3 kg)  03/06/17 174 lb 6.4 oz (79.1 kg)    Physical Exam  Constitutional: She is oriented to person, place, and time. She appears well-developed and well-nourished.  HENT:  Head: Normocephalic and atraumatic.  Mouth/Throat: Oropharynx is clear and moist. No oropharyngeal exudate.  Eyes: Pupils are equal, round, and reactive to light. Conjunctivae and EOM are normal. No scleral icterus.  Neck: Neck supple. No thyromegaly present.  Cardiovascular: Normal rate, regular rhythm and normal heart sounds. Exam reveals no gallop and no friction rub.  No murmur heard. Pulmonary/Chest: Effort normal and breath sounds normal. She has no wheezes. She has no rales.  Musculoskeletal: She exhibits no edema.  Neurological: She is alert and oriented to person, place, and time.  Skin: Skin is warm and dry.  Psychiatric: She has a normal mood and affect.  Nursing note and vitals reviewed.   ASSESSMENT and PLAN  1. Hypercalcemia 2. Low serum parathyroid hormone (PTH) Next steps pending lab results - CMP14+EGFR - VITAMIN D 25 Hydroxy (Vit-D Deficiency, Fractures) - PTH-Related Peptide - Vitamin D 1,25 dihydroxy - PTH, Intact and Calcium -  SPEP  3. Essential hypertension Controlled. Continue current regime.   4. Pure hypercholesterolemia Controlled. Continue current regime.   5. Polycythemia - CBC  Return in about 6 months (around 08/17/2018) for medicare annual wellness.    Irma M Santiago, MD Primary Care at [facility] [address] [place], [place] [address] Ph.  [phone] Fax [phone]   

## 2018-02-19 LAB — PROTEIN ELECTROPHORESIS, SERUM
A/G Ratio: 1.3 (ref 0.7–1.7)
Albumin ELP: 4.3 g/dL (ref 2.9–4.4)
Alpha 1: 0.2 g/dL (ref 0.0–0.4)
Alpha 2: 1 g/dL (ref 0.4–1.0)
Beta: 1.1 g/dL (ref 0.7–1.3)
Gamma Globulin: 0.8 g/dL (ref 0.4–1.8)
Globulin, Total: 3.2 g/dL (ref 2.2–3.9)
Total Protein: 7.5 g/dL (ref 6.0–8.5)

## 2018-02-22 LAB — CBC
Hematocrit: 48.1 % — ABNORMAL HIGH (ref 34.0–46.6)
Hemoglobin: 17.3 g/dL — ABNORMAL HIGH (ref 11.1–15.9)
MCH: 32.5 pg (ref 26.6–33.0)
MCHC: 36 g/dL — ABNORMAL HIGH (ref 31.5–35.7)
MCV: 90 fL (ref 79–97)
Platelets: 320 10*3/uL (ref 150–450)
RBC: 5.32 x10E6/uL — ABNORMAL HIGH (ref 3.77–5.28)
RDW: 12.7 % (ref 12.3–15.4)
WBC: 8.5 10*3/uL (ref 3.4–10.8)

## 2018-02-22 LAB — CMP14+EGFR
ALT: 28 IU/L (ref 0–32)
AST: 28 IU/L (ref 0–40)
Albumin/Globulin Ratio: 2.3 — ABNORMAL HIGH (ref 1.2–2.2)
Albumin: 5.3 g/dL — ABNORMAL HIGH (ref 3.6–4.8)
Alkaline Phosphatase: 107 IU/L (ref 39–117)
BUN/Creatinine Ratio: 19 (ref 12–28)
BUN: 25 mg/dL (ref 8–27)
Bilirubin Total: 0.4 mg/dL (ref 0.0–1.2)
CO2: 19 mmol/L — ABNORMAL LOW (ref 20–29)
Calcium: 12.3 mg/dL — ABNORMAL HIGH (ref 8.7–10.3)
Chloride: 96 mmol/L (ref 96–106)
Creatinine, Ser: 1.3 mg/dL — ABNORMAL HIGH (ref 0.57–1.00)
GFR calc Af Amer: 48 mL/min/{1.73_m2} — ABNORMAL LOW (ref >59)
GFR calc non Af Amer: 42 mL/min/{1.73_m2} — ABNORMAL LOW (ref >59)
Globulin, Total: 2.3 g/dL (ref 1.5–4.5)
Glucose: 94 mg/dL (ref 65–99)
Potassium: 4.1 mmol/L (ref 3.5–5.2)
Sodium: 144 mmol/L (ref 134–144)
Total Protein: 7.6 g/dL (ref 6.0–8.5)

## 2018-02-22 LAB — VITAMIN D 1,25 DIHYDROXY
Vitamin D 1, 25 (OH)2 Total: <10 pg/mL — ABNORMAL LOW
Vitamin D2 1, 25 (OH)2: <10 pg/mL
Vitamin D3 1, 25 (OH)2: <10 pg/mL

## 2018-02-22 LAB — PTH-RELATED PEPTIDE: PTH-related peptide: <2 pmol/L

## 2018-02-22 LAB — PTH, INTACT AND CALCIUM: PTH: 12 pg/mL — ABNORMAL LOW (ref 15–65)

## 2018-02-22 LAB — VITAMIN D 25 HYDROXY (VIT D DEFICIENCY, FRACTURES): Vit D, 25-Hydroxy: 52.5 ng/mL (ref 30.0–100.0)

## 2018-02-25 NOTE — Addendum Note (Signed)
Addended by: Rutherford Guys on: 02/25/2018 12:07 PM   Modules accepted: Orders

## 2018-02-28 ENCOUNTER — Encounter: Payer: Self-pay | Admitting: Family Medicine

## 2018-03-08 ENCOUNTER — Encounter: Payer: Self-pay | Admitting: Cardiovascular Disease

## 2018-03-08 ENCOUNTER — Telehealth: Payer: Self-pay | Admitting: Hematology

## 2018-03-08 ENCOUNTER — Ambulatory Visit (INDEPENDENT_AMBULATORY_CARE_PROVIDER_SITE_OTHER): Payer: Medicare Other | Admitting: Cardiovascular Disease

## 2018-03-08 VITALS — BP 164/76 | HR 81 | Ht 61.0 in | Wt 170.0 lb

## 2018-03-08 DIAGNOSIS — I350 Nonrheumatic aortic (valve) stenosis: Secondary | ICD-10-CM | POA: Diagnosis not present

## 2018-03-08 DIAGNOSIS — E669 Obesity, unspecified: Secondary | ICD-10-CM | POA: Diagnosis not present

## 2018-03-08 DIAGNOSIS — I2510 Atherosclerotic heart disease of native coronary artery without angina pectoris: Secondary | ICD-10-CM

## 2018-03-08 DIAGNOSIS — I10 Essential (primary) hypertension: Secondary | ICD-10-CM

## 2018-03-08 DIAGNOSIS — E7800 Pure hypercholesterolemia, unspecified: Secondary | ICD-10-CM | POA: Diagnosis not present

## 2018-03-08 NOTE — Patient Instructions (Signed)
Medication Instructions:  Dr Sallyanne Kuster recommends that you continue on your current medications as directed. Please refer to the Current Medication list given to you today.  If you need a refill on your cardiac medications before your next appointment, please call your pharmacy.   Follow-Up: Dr Sallyanne Kuster recommends that you schedule a follow-up appointment in 12 months. You will receive a reminder letter in the mail two months in advance. If you don't receive a letter, please call our office to schedule the follow-up appointment.

## 2018-03-08 NOTE — Telephone Encounter (Signed)
Sierra Carpenter is an established pt of Dr. Irene Limbo who has been referred back for plycythemia by Dr. Pamella Pert. Pt has been scheduled to see Dr. Irene Limbo on 04/05/18 at 2:20pm.

## 2018-03-08 NOTE — Progress Notes (Signed)
Cardiology Office Note    Date:  03/10/2018   ID:  Sierra Carpenter, Sierra Carpenter 1945/12/09, MRN [id number]  PCP:  Rutherford Guys, MD  Cardiologist:   Sanda Klein, MD   Chief complaint: AS    History of Present Illness:  Sierra Carpenter is a 72 y.o. female with degenerative aortic stenosis, coronary artery disease, hypertension and hyperlipidemia.  She has occasional chest discomfort when she gets upset, but not with physical activity.  She takes care of all her household and her yard.  The patient specifically denies any chest pain on exertion, dyspnea at rest or with exertion, orthopnea, paroxysmal nocturnal dyspnea, syncope, palpitations, focal neurological deficits, intermittent claudication, lower extremity edema, unexplained weight gain, cough, hemoptysis or wheezing.  The blood pressure was elevated when she first checked in today at 164/76.  When I rechecked her 10 minutes later it was 150/68.  She does check her blood pressure at home fairly frequently and reports that it is consistently in the 1 25-130 range.  In 2000 she had coronary angiography that showed minor coronary artery disease (50% ostial first diagonal artery, 30% proximal RCA).  She had a normal stress test in November 2018.  Also November 2017 her echo showed moderate aortic stenosis with an estimated valve area of around 1.3 cm and a mean gradient of 28 mmHg.  She has normal left ventricular systolic function.  Past Medical History:  Diagnosis Date  . Aortic valve sclerosis   . Coronary atherosclerosis    without significant obstruction  . Heart murmur   . History of nuclear stress test 07/12/2010   bruce protocol myoview; normal pattern of perfusion, low risk, post-stress EF 92%  . Hyperlipidemia   . Hypertension     Past Surgical History:  Procedure Laterality Date  . ABDOMINAL HYSTERECTOMY     fibroids; ovaries intact.  . APPENDECTOMY    . CARDIAC CATHETERIZATION     moderate stenosis in 1st diagonal &  RCA  . CESAREAN SECTION    . CHOLECYSTECTOMY    . EYE SURGERY    . TRANSTHORACIC ECHOCARDIOGRAM  07/12/2010   EF=>55% with normal systolic function; trace MR/TR/PR    Current Medications: Outpatient Medications Prior to Visit  Medication Sig Dispense Refill  . Ascorbic Acid (VITAMIN C PO) Take by mouth daily.    Marland Kitchen aspirin 81 MG tablet Take 1 tablet (81 mg total) by mouth daily. 1 tablet   . atorvastatin (LIPITOR) 40 MG tablet Take 1 tablet (40 mg total) by mouth daily. 90 tablet 3  . co-enzyme Q-10 30 MG capsule Take 100 mg by mouth 2 (two) times daily.    . fish oil-omega-3 fatty acids 1000 MG capsule Take 1 capsule by mouth 3 (three) times daily.    . meloxicam (MOBIC) 7.5 MG tablet Take 1 tablet (7.5 mg total) by mouth daily. 90 tablet 0  . Multiple Vitamins-Minerals (PRESERVISION AREDS 2 PO) Take 1 tablet by mouth daily.     Marland Kitchen oxybutynin (DITROPAN) 5 MG tablet Take 1 tablet (5 mg total) by mouth 2 (two) times daily. 60 tablet 5  . valsartan-hydrochlorothiazide (DIOVAN-HCT) 160-12.5 MG tablet TAKE 1 TABLET BY MOUTH ONCE DAILY 90 tablet 0  . Calcium Carb-Cholecalciferol (CALCIUM 600+D3) 600-200 MG-UNIT TABS Take by mouth 2 (two) times daily.     No facility-administered medications prior to visit.      Allergies:   Patient has no known allergies.   Social History   Socioeconomic History  .  Marital status: Widowed    Spouse name: Not on file  . Number of children: 1  . Years of education: 6  . Highest education level: Not on file  Occupational History  . Occupation: retired    Comment: Pensions consultant and [employer]  Social Needs  . Financial resource strain: Not on file  . Food insecurity:    Worry: Not on file    Inability: Not on file  . Transportation needs:    Medical: Not on file    Non-medical: Not on file  Tobacco Use  . Smoking status: Never Smoker  . Smokeless tobacco: Never Used  Substance and Sexual Activity  . Alcohol use: No  . Drug use: No  . Sexual activity:  Yes    Birth control/protection: Post-menopausal, Surgical  Lifestyle  . Physical activity:    Days per week: Not on file    Minutes per session: Not on file  . Stress: Not on file  Relationships  . Social connections:    Talks on phone: Not on file    Gets together: Not on file    Attends religious service: Not on file    Active member of club or organization: Not on file    Attends meetings of clubs or organizations: Not on file    Relationship status: Not on file  Other Topics Concern  . Not on file  Social History Narrative   Marital status: widowed since 2014 due to COPD; married x 37 years.      Children: 1 daughter; 1 son died of CHF age 63 pacemaker.  4 grandchildren.      Lives: alone      Employment: retired at age 46 from [employer].      Tobacco:  never       Alcohol:  never      Exercise: yes in 2019; walking 1-2 miles daily.  DDD lumbar limiting walking.      ADLs: independent with ADLs; drives.        Advanced Directives: none; has paper.  FULL CODE; no prolonged measures.  HCPOA: daughter/Misty Wenn-Maness.     Family History:  The patient's family history includes Alzheimer's disease in her mother; COPD in her brother; Heart disease in her brother, brother, brother, paternal grandfather, and son; Heart disease (age of onset: 41) in her father.   ROS:   Please see the history of present illness.    ROS all other systems are reviewed and are negative   PHYSICAL EXAM:   VS:  BP (!) 164/76   Pulse 81   Ht 5\' 1"  (1.549 m)   Wt 170 lb (77.1 kg)   BMI 32.12 kg/m    Recheck BP 150/68 mmHg  General: Alert, oriented x3, no distress, looks fit, but is mildly obese Head: no evidence of trauma, PERRL, EOMI, no exophtalmos or lid lag, no myxedema, no xanthelasma; normal ears, nose and oropharynx Neck: normal jugular venous pulsations and no hepatojugular reflux; brisk carotid pulses without delay and no carotid bruits Chest: clear to auscultation, no signs of  consolidation by percussion or palpation, normal fremitus, symmetrical and full respiratory excursions Cardiovascular: normal position and quality of the apical impulse, regular rhythm, normal first and second heart sounds, no diastolic murmurs, rubs or gallops.  She has a early peaking 3/6 aortic ejection murmur that radiates towards the carotids. Abdomen: no tenderness or distention, no masses by palpation, no abnormal pulsatility or arterial bruits, normal bowel sounds, no hepatosplenomegaly Extremities: no  clubbing, cyanosis or edema; 2+ radial, ulnar and brachial pulses bilaterally; 2+ right femoral, posterior tibial and dorsalis pedis pulses; 2+ left femoral, posterior tibial and dorsalis pedis pulses; no subclavian or femoral bruits Neurological: grossly nonfocal Psych: Normal mood and affect   Wt Readings from Last 3 Encounters:  03/08/18 170 lb (77.1 kg)  02/16/18 164 lb 9.6 oz (74.7 kg)  08/12/17 155 lb (70.3 kg)      Studies/Labs Reviewed:   EKG:  EKG is ordered today.  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  Very similar to previous tracing.    Recent Labs: 02/16/2018: ALT 28; BUN 25; Creatinine, Ser 1.30; Hemoglobin 17.3; Platelets 320; Potassium 4.1; Sodium 144   Lipid Panel    Component Value Date/Time   CHOL 149 08/12/2017 1440   TRIG 150 (H) 08/12/2017 1440   HDL 46 08/12/2017 1440   CHOLHDL 3.2 08/12/2017 1440   CHOLHDL 3.2 11/18/2015 1111   VLDL 29 11/18/2015 1111   LDLCALC 73 08/12/2017 1440      ASSESSMENT:    1. Atherosclerosis of native coronary artery of native heart without angina pectoris   2. Non-rheumatic aortic stenosis   3. Essential hypertension   4. Hypercholesterolemia   5. Mild obesity      PLAN:  In order of problems listed above:  1. CAD: Previous  nuclear perfusion imaging showed normal findings.  She is known to have some coronary stenoses and small branches.  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 2. AS: Clinically she still appears to have moderate aortic stenosis.  Asymptomatic but I reminded her to promptly report exertional angina, exertional dyspnea or exertional syncope.  Discussed options for aortic valve replacement either surgical with a biological prosthesis or TAVR, when she becomes symptomatic. 3. HTN: Mild elevation in systolic blood pressure, she reports it is normal at home 4. HLP: LDL cholesterol very close to target on medications 5. Obesity: Encouraged her again to try to lose some weight..   Medication Adjustments/Labs and Tests Ordered: Current medicines are reviewed at length with the patient today.  Concerns regarding medicines are outlined above.  Medication changes, Labs and Tests ordered today are listed in the Patient Instructions below. Patient Instructions  Medication Instructions:  Dr Sallyanne Kuster recommends that you continue on your current medications as directed. Please refer to the Current Medication list given to you today.  If you need a refill on your cardiac medications before your next appointment, please call your pharmacy.   Follow-Up: Dr Sallyanne Kuster recommends that you schedule a follow-up appointment in 12 months. You will receive a reminder letter in the mail two months in advance. If you don't receive a letter, please call our office to schedule the follow-up appointment.    Signed, Sanda Klein, MD  03/10/2018 8:56 PM    [facility] [place], [place], [place]  [address] Phone: [phone]; Fax: [phone]

## 2018-03-14 ENCOUNTER — Ambulatory Visit (INDEPENDENT_AMBULATORY_CARE_PROVIDER_SITE_OTHER): Payer: Medicare Other | Admitting: Internal Medicine

## 2018-03-14 LAB — COMPREHENSIVE METABOLIC PANEL
ALBUMIN: 4.9 g/dL (ref 3.5–5.2)
ALT: 25 U/L (ref 0–35)
AST: 24 U/L (ref 0–37)
Alkaline Phosphatase: 90 U/L (ref 39–117)
BUN: 22 mg/dL (ref 6–23)
CALCIUM: 10.9 mg/dL — AB (ref 8.4–10.5)
CHLORIDE: 99 meq/L (ref 96–112)
CO2: 29 meq/L (ref 19–32)
Creatinine, Ser: 1.19 mg/dL (ref 0.40–1.20)
GFR: 47.67 mL/min — ABNORMAL LOW (ref >60.00)
GLUCOSE: 104 mg/dL — AB (ref 70–99)
POTASSIUM: 4.5 meq/L (ref 3.5–5.1)
Sodium: 138 mEq/L (ref 135–145)
Total Bilirubin: 0.6 mg/dL (ref 0.2–1.2)
Total Protein: 7.8 g/dL (ref 6.0–8.3)

## 2018-03-14 LAB — TSH: TSH: 2.82 u[IU]/mL (ref 0.35–4.50)

## 2018-03-14 LAB — T4, FREE: FREE T4: 0.93 ng/dL (ref 0.60–1.60)

## 2018-03-14 NOTE — Patient Instructions (Signed)
-   Please stop all Vitamins and supplements that have calcium in them - Continues to eat 2-3 servings of calcium a day  - Stay hydrated when possible - Will consider putting you on a medicine that would may work on reducing your calcium levels.  - Please keep your appointment with the oncologist

## 2018-03-14 NOTE — Progress Notes (Signed)
Name: Sierra Carpenter  MRN/ DOB: [id number], [birth year]    Age/ Sex: 72 y.o., female    PCP: Rutherford Guys, MD   Reason for Endocrinology Evaluation: Hypercalcemia      Date of Initial Endocrinology Evaluation: 03/14/2018     HPI: [name] is a 72 y.o. female with a past medical history of HTN. The patient presented for initial endocrinology clinic visit on 03/14/2018 for consultative assistance with her Hypercalcemia.    [name] indicates that she was first diagnosed with hypercalcemia in 2017,during routine work up. Since that time, she has not experienced symptoms of constipation, polyuria, polydipsia, generalized weakness, diffuse muscle pains, significant memory impairment. She stopped  The use of over the counter calcium (including supplements, Tums, Rolaids, or other calcium containing antacids) ~2 weeks ago Denies being on lithium, or vitamin D supplements.   She has been on DIOVAN-HCT less then a year ago  She has a history of kidney stones many years ago (1980's), she has kidney disease, she denies liver disease, granulomatous disease. She denies osteoporosis or prior fractures. Daily dietary calcium intake: 2 servings.  She denies family history of osteoporosis, parathyroid disease.   Two brothers and mother with thyroid disease (hypothyroidism)    HISTORY:  Past Medical History:  Past Medical History:  Diagnosis Date  . Aortic valve sclerosis   . Coronary atherosclerosis    without significant obstruction  . Heart murmur   . History of nuclear stress test 07/12/2010   bruce protocol myoview; normal pattern of perfusion, low risk, post-stress EF 92%  . Hyperlipidemia   . Hypertension     Past Surgical History:  Past Surgical History:  Procedure Laterality Date  . ABDOMINAL HYSTERECTOMY     fibroids; ovaries intact.  . APPENDECTOMY    . CARDIAC CATHETERIZATION     moderate stenosis in 1st diagonal & RCA  . CESAREAN SECTION    . CHOLECYSTECTOMY     . EYE SURGERY    . TRANSTHORACIC ECHOCARDIOGRAM  07/12/2010   EF=>55% with normal systolic function; trace MR/TR/PR      Social History:  reports that she has never smoked. She has never used smokeless tobacco. She reports that she does not drink alcohol or use drugs.  Family History: family history includes Alzheimer's disease in her mother; COPD in her brother; Heart disease in her brother, brother, brother, paternal grandfather, and son; Heart disease (age of onset: 92) in her father.   HOME MEDICATIONS: Current Outpatient Medications on File Prior to Visit  Medication Sig Dispense Refill  . Ascorbic Acid (VITAMIN C PO) Take by mouth daily.    Marland Kitchen aspirin 81 MG tablet Take 1 tablet (81 mg total) by mouth daily. 1 tablet   . atorvastatin (LIPITOR) 40 MG tablet Take 1 tablet (40 mg total) by mouth daily. 90 tablet 3  . co-enzyme Q-10 30 MG capsule Take 100 mg by mouth 2 (two) times daily.    . fish oil-omega-3 fatty acids 1000 MG capsule Take 1 capsule by mouth 3 (three) times daily.    . meloxicam (MOBIC) 7.5 MG tablet Take 1 tablet (7.5 mg total) by mouth daily. (Patient taking differently: Take 7.5 mg by mouth daily. Takes half a tablet) 90 tablet 0  . Multiple Vitamins-Minerals (PRESERVISION AREDS 2 PO) Take 1 tablet by mouth 2 (two) times daily.     Marland Kitchen oxybutynin (DITROPAN) 5 MG tablet Take 1 tablet (5 mg total) by mouth 2 (two) times  daily. 60 tablet 5  . valsartan-hydrochlorothiazide (DIOVAN-HCT) 160-12.5 MG tablet TAKE 1 TABLET BY MOUTH ONCE DAILY 90 tablet 0   No current facility-administered medications on file prior to visit.       REVIEW OF SYSTEMS: A comprehensive ROS was conducted with the patient and is negative except as per HPI and below:  Review of Systems  Constitutional: Negative for malaise/fatigue and weight loss.  HENT: Negative for congestion and sore throat.   Eyes: Negative for blurred vision and pain.  Respiratory: Negative for cough and shortness of  breath.   Cardiovascular: Negative for chest pain and palpitations.  Gastrointestinal: Negative for constipation and nausea.  Genitourinary: Negative for frequency.  Musculoskeletal: Positive for back pain and joint pain.  Skin: Negative.   Neurological: Negative for tingling and tremors.  Endo/Heme/Allergies: Negative for polydipsia.  Psychiatric/Behavioral: Negative for depression and memory loss.       OBJECTIVE:  VS: BP 130/72 (BP Location: Left Arm, Patient Position: Sitting, Cuff Size: Normal)   Pulse 84   Ht 5\' 1"  (1.549 m)   Wt 165 lb 4.8 oz (75 kg)   SpO2 98%   BMI 31.23 kg/m    Wt Readings from Last 3 Encounters:  03/14/18 165 lb 4.8 oz (75 kg)  03/08/18 170 lb (77.1 kg)  02/16/18 164 lb 9.6 oz (74.7 kg)     EXAM: General: Pt appears well and is in NAD  Hydration: Well-hydrated with moist mucous membranes and good skin turgor  Eyes: External eye exam normal without stare, lid lag or exophthalmos.  EOM intact.  PERRL.  Ears, Nose, Throat: Hearing: Grossly intact bilaterally Dental: wears dentures  Throat: Clear without mass, erythema or exudate  Neck: General: Supple without adenopathy. Thyroid: Thyroid size normal.  No goiter or nodules appreciated. No thyroid bruit.  Lungs: Clear with good BS bilat with no rales, rhonchi, or wheezes  Heart: Auscultation: RRR.  Abdomen: Normoactive bowel sounds, soft, nontender, without masses or organomegaly palpable  Extremities: BL LE: No pretibial edema normal ROM and strength.  Skin: Hair: Texture and amount normal with gender appropriate distribution Skin Inspection: No rashes. Skin Palpation: Skin temperature, texture, and thickness normal to palpation  Neuro: Cranial nerves: II - XII grossly intact  Motor: Normal strength throughout DTRs: 2+ and symmetric in UE without delay in relaxation phase  Mental Status: Judgment, insight: Intact Orientation: Oriented to time, place, and person Mood and affect: No depression,  anxiety, or agitation     DATA REVIEWED:   Results for Sierra, Carpenter (MRN [id number]) as of 03/14/2018 13:48  Ref. Range 02/16/2018 10:27 02/16/2018 11:58  Sodium Latest Ref Range: 134 - 144 mmol/L 144   Potassium Latest Ref Range: 3.5 - 5.2 mmol/L 4.1   Chloride Latest Ref Range: 96 - 106 mmol/L 96   CO2 Latest Ref Range: 20 - 29 mmol/L 19 (L)   Glucose Latest Ref Range: 65 - 99 mg/dL 94   BUN Latest Ref Range: 8 - 27 mg/dL 25   Creatinine Latest Ref Range: 0.57 - 1.00 mg/dL 1.30 (H)   Calcium Latest Ref Range: 8.7 - 10.3 mg/dL 12.3 (H)   BUN/Creatinine Ratio Latest Ref Range: 12 - 28  19   Alkaline Phosphatase Latest Ref Range: 39 - 117 IU/L 107   Albumin Latest Ref Range: 3.6 - 4.8 g/dL 5.3 (H)   Albumin/Globulin Ratio Latest Ref Range: 1.2 - 2.2  2.3 (H)   AST Latest Ref Range: 0 - 40 IU/L 28  ALT Latest Ref Range: 0 - 32 IU/L 28   Total Protein Latest Ref Range: 6.0 - 8.5 g/dL 7.6 7.5  Total Bilirubin Latest Ref Range: 0.0 - 1.2 mg/dL 0.4   GFR, Est Non African American Latest Ref Range: >59 mL/min/1.73 42 (L)   GFR, Est African American Latest Ref Range: >59 mL/min/1.73 48 (L)   Vitamin D, 25-Hydroxy Latest Ref Range: 30.0 - 100.0 ng/mL 52.5   Vitamin D 1, 25 (OH) Total Latest Units: pg/mL <10 (L)   Vitamin D2 1, 25 (OH) Latest Units: pg/mL <10   Vitamin D3 1, 25 (OH) Latest Units: pg/mL <10   Interpretation Unknown  Comment  Albumin ELP Latest Ref Range: 2.9 - 4.4 g/dL  4.3  Alpha 1 Latest Ref Range: 0.0 - 0.4 g/dL  0.2  Alpha 2 Latest Ref Range: 0.4 - 1.0 g/dL  1.0  Beta Latest Ref Range: 0.7 - 1.3 g/dL  1.1  Globulin, Total Latest Ref Range: 2.2 - 3.9 g/dL 2.3 3.2  A/G Ratio Latest Ref Range: 0.7 - 1.7   1.3  Gamma Globulin Latest Ref Range: 0.4 - 1.8 g/dL  0.8  M-SPIKE, % Latest Ref Range: Not Observed g/dL  Not Observed  Please Note: Unknown  Comment   Results for KASSIE, KENG (MRN [id number]) as of 03/14/2018 13:48  Ref. Range 12/01/2016 12:20 08/12/2017 14:40  02/16/2018 10:27  PTH, Intact Latest Ref Range: 15 - 65 pg/mL 16 14 (L) 12 (L)   MRI spine 12/24/16 Vertebrae: Hyperintense T1-T2 weighted signal lesion within the L2 vertebral body with associated trabeculation, likely hemangioma. No other focal marrow lesion.  Old records , labs and images have been reviewed.    ASSESSMENT/PLAN/RECOMMENDATIONS:   1. Non-PTH Mediated Hypercalcemia :  - Pt is asymptomatic - Her corrected serum calcium is 11.26 mg/dL  - She has stopped her OTC calcium ~ 2 weeks ago, will recheck today - In review of her lab results, she has no evidence of granulomatous disease, or vitamin D toxicity. Her PTHrp is negative, as well as sPEP, pt with low 1,25 dihydroxyvitamin D - Other endocrine causes for hypercalcemia include pheochromocytoma and adrenal insufficiency. I do not see any clinical suspicion for adrenal insufficiency, but will proceed with plasma metanephrine.  - I agree with PCP in sending her to oncology for further work up  - I would recommend switching from DIOVAN HCT to DIOVAN without the HCTZ, even though HCTZ reduces calciuria but it tends to increase serum calcium.  - We discussed risk of hypercalcemia such as renal stones and nephrocalcinosis.  - I have advised her to stop MVI and all supplements with calcium .  - Repeat TFT's today are normal, awaiting on vitamin A levels, - Interestingly , her repeat PTH is trending upward and her serum calcium is trending down after she stopped the calcium tablets. I am hoping with stopping her women's MVI, and any other OTC supplements with calcium and stopping HCTZ, that her calcium will get to an acceptable level to where can see the real picture.  - If her PTH continues to rise to even the mid-normal range with hypercalcemia, then she could have primary Hyperparathyroidism.  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 Will defer this to PCP, and oncology. - Vitamin A level pending   F/u 6 weeks  Signed electronically by: Mack Guise, MD  [facility] Endocrinology  [facility] [address] [place], [place] [address] Phone: [phone] FAX: [phone]   CC: Rutherford Guys, MD [address] [place] [address] Phone: [phone] Fax: [phone]   Return to Endocrinology clinic as below: Future Appointments  Date Time Provider [place]  04/05/2018  2:20 PM Brunetta Genera, MD [facility] None  08/21/2018  9:20 AM Rutherford Guys, MD PCP-PCP [facility]

## 2018-03-15 ENCOUNTER — Telehealth: Payer: Self-pay | Admitting: Internal Medicine

## 2018-03-15 ENCOUNTER — Other Ambulatory Visit: Payer: Self-pay | Admitting: Family Medicine

## 2018-03-15 ENCOUNTER — Encounter: Payer: Self-pay | Admitting: Internal Medicine

## 2018-03-15 ENCOUNTER — Encounter: Payer: Self-pay | Admitting: Family Medicine

## 2018-03-15 LAB — PTH, INTACT AND CALCIUM
Calcium: 10.6 mg/dL — ABNORMAL HIGH (ref 8.6–10.4)
PTH: 21 pg/mL (ref 14–64)

## 2018-03-15 MED ORDER — VALSARTAN 160 MG PO TABS: 160.0000 mg | ORAL_TABLET | Freq: Every day | ORAL | 0 refills | Status: DC

## 2018-03-15 NOTE — Telephone Encounter (Signed)
Left a message for the patient to come back to the office for an additional blood test.     Order for metanephrine entered.    Melanie Crazier Kairy [place]

## 2018-03-16 ENCOUNTER — Encounter: Payer: Self-pay | Admitting: Internal Medicine

## 2018-03-18 LAB — VITAMIN A: VITAMIN A (RETINOIC ACID): 120 ug/dL — AB (ref 38–98)

## 2018-03-18 LAB — T3: T3, Total: 136 ng/dL (ref 76–181)

## 2018-03-20 ENCOUNTER — Other Ambulatory Visit: Payer: Medicare Other

## 2018-03-23 ENCOUNTER — Other Ambulatory Visit (INDEPENDENT_AMBULATORY_CARE_PROVIDER_SITE_OTHER): Payer: Medicare Other

## 2018-03-26 LAB — METANEPHRINES, PLASMA
Metanephrine, Free: 52 pg/mL (ref <=57)
NORMETANEPHRINE FREE: 226 pg/mL — AB (ref <=148)
TOTAL METANEPHRINES-PLASMA: 278 pg/mL — AB (ref <=205)

## 2018-03-27 ENCOUNTER — Encounter: Payer: Medicare Other | Admitting: Oncology

## 2018-04-05 ENCOUNTER — Inpatient Hospital Stay: Payer: Medicare Other | Attending: Hematology | Admitting: Hematology

## 2018-04-05 ENCOUNTER — Other Ambulatory Visit: Payer: Self-pay | Admitting: Hematology

## 2018-04-05 VITALS — BP 163/74 | HR 86 | Temp 98.4°F | Resp 18 | Ht 61.0 in | Wt 173.1 lb

## 2018-04-05 DIAGNOSIS — D751 Secondary polycythemia: Secondary | ICD-10-CM | POA: Insufficient documentation

## 2018-04-05 NOTE — Progress Notes (Signed)
Marland Kitchen    HEMATOLOGY/ONCOLOGY CONSULTATION NOTE  Date of Service: 04/05/2018  Patient Care Team: Rutherford Guys, MD as PCP - General (Family Medicine) Sanda Klein, MD as Consulting Physician (Cardiology) Katy Fitch, Darlina Guys, MD as Consulting Physician (Ophthalmology)  CHIEF COMPLAINTS/PURPOSE OF CONSULTATION:  Polycythemia  HISTORY OF PRESENTING ILLNESS:  Sierra Carpenter is a wonderful 73 y.o. female who has been referred to [place] by Dr Everlene Farrier for evaluation and management of polycythemia.  Patient has a history of hypertension, dyslipidemia, coronary artery disease who had recent routine labs with her primary care physician about a month ago and was noted to have an elevated hemoglobin of 16.3 with a hematocrit of 47.3.  WBC count was normal at 8.3k and platelets were within normal limits at 302k. Patient was referred to [place] for further evaluation for polycythemia.  Patient denies any visual blurring, headaches, fever, chills, night sweats, shortness of breath, abdominal pain.  Patient has no other acute focal symptoms at this time.  No previous history of blood clots. Denies any sleeping problem. He has been drinking enough water. She is on a diuretic for hypertension.  Labs in clinic today show a hemoglobin of 16.4 with hematocrit of 46.  WBC count of 11.2k normal platelets of 292k  Interval History:   Sierra Carpenter returns today for management and evaluation of her polycythemia. The patient's last visit with [place] was on 06/01/15. The pt reports that she is doing well overall. The pt notes that her PCP is now Dr. Grant Fontana.   The pt reports that she has recently switched to Valsartan from HCTZ in the last 1-2 months. She notes that she did not obtain a sleep study in the past, as was previously recommended. She notes that she continues to feel like her normal self and denies any concerns whatsoever. The pt does not smoke cigarettes, but endorses second hand smoke exposure "all her  life."   Most recent lab results (02/16/18) of CBC is as follows: all values are WNL except for RBC at 5.32, HGB at 17.3, HCT at 48.1, MCHC at 36.0  On review of systems, pt reports stable energy levels, eating well, and denies fevers, chills, night sweats, fatigue, light headedness, SOB, and any other symptoms.   MEDICAL HISTORY:  Past Medical History:  Diagnosis Date  . Aortic valve sclerosis   . Coronary atherosclerosis    without significant obstruction  . Heart murmur   . History of nuclear stress test 07/12/2010   bruce protocol myoview; normal pattern of perfusion, low risk, post-stress EF 92%  . Hyperlipidemia   . Hypertension     SURGICAL HISTORY: Past Surgical History:  Procedure Laterality Date  . ABDOMINAL HYSTERECTOMY     fibroids; ovaries intact.  . APPENDECTOMY    . CARDIAC CATHETERIZATION     moderate stenosis in 1st diagonal & RCA  . CESAREAN SECTION    . CHOLECYSTECTOMY    . EYE SURGERY    . TRANSTHORACIC ECHOCARDIOGRAM  07/12/2010   EF=>55% with normal systolic function; trace MR/TR/PR    SOCIAL HISTORY: Social History   Socioeconomic History  . Marital status: Widowed    Spouse name: Not on file  . Number of children: 1  . Years of education: 38  . Highest education level: Not on file  Occupational History  . Occupation: retired    Comment: Pensions consultant and [employer]  Social Needs  . Financial resource strain: Not on file  . Food insecurity:  Worry: Not on file    Inability: Not on file  . Transportation needs:    Medical: Not on file    Non-medical: Not on file  Tobacco Use  . Smoking status: Never Smoker  . Smokeless tobacco: Never Used  Substance and Sexual Activity  . Alcohol use: No  . Drug use: No  . Sexual activity: Yes    Birth control/protection: Post-menopausal, Surgical  Lifestyle  . Physical activity:    Days per week: Not on file    Minutes per session: Not on file  . Stress: Not on file  Relationships  . Social connections:      Talks on phone: Not on file    Gets together: Not on file    Attends religious service: Not on file    Active member of club or organization: Not on file    Attends meetings of clubs or organizations: Not on file    Relationship status: Not on file  . Intimate partner violence:    Fear of current or ex partner: Not on file    Emotionally abused: Not on file    Physically abused: Not on file    Forced sexual activity: Not on file  Other Topics Concern  . Not on file  Social History Narrative   Marital status: widowed since 2014 due to COPD; married x 37 years.      Children: 1 daughter; 1 son died of CHF age 46 pacemaker.  4 grandchildren.      Lives: alone      Employment: retired at age 66 from [employer].      Tobacco:  never       Alcohol:  never      Exercise: yes in 2019; walking 1-2 miles daily.  DDD lumbar limiting walking.      ADLs: independent with ADLs; drives.        Advanced Directives: none; has paper.  FULL CODE; no prolonged measures.  HCPOA: daughter/Sierra Carpenter.    FAMILY HISTORY: Family History  Problem Relation Age of Onset  . Heart disease Father 37       AMI  . Heart disease Brother   . Heart disease Son   . Heart disease Paternal Grandfather   . Alzheimer's disease Mother   . Heart disease Brother        CHF/CAD  . COPD Brother   . Heart disease Brother   . Breast cancer Neg Hx     ALLERGIES:  has No Known Allergies.  MEDICATIONS:  Current Outpatient Medications  Medication Sig Dispense Refill  . Ascorbic Acid (VITAMIN C PO) Take by mouth daily.    Marland Kitchen aspirin 81 MG tablet Take 1 tablet (81 mg total) by mouth daily. 1 tablet   . atorvastatin (LIPITOR) 40 MG tablet Take 1 tablet (40 mg total) by mouth daily. 90 tablet 3  . co-enzyme Q-10 30 MG capsule Take 100 mg by mouth 2 (two) times daily.    . fish oil-omega-3 fatty acids 1000 MG capsule Take 1 capsule by mouth 3 (three) times daily.    . meloxicam (MOBIC) 7.5 MG tablet Take 1  tablet (7.5 mg total) by mouth daily. (Patient taking differently: Take 7.5 mg by mouth daily. Takes half a tablet) 90 tablet 0  . Multiple Vitamins-Minerals (PRESERVISION AREDS 2 PO) Take 1 tablet by mouth 2 (two) times daily.     Marland Kitchen oxybutynin (DITROPAN) 5 MG tablet Take 1 tablet (5 mg total) by mouth  2 (two) times daily. 60 tablet 5  . valsartan (DIOVAN) 160 MG tablet Take 1 tablet (160 mg total) by mouth daily. 90 tablet 0   No current facility-administered medications for this visit.     REVIEW OF SYSTEMS:    A 10+ POINT REVIEW OF SYSTEMS WAS OBTAINED including neurology, dermatology, psychiatry, cardiac, respiratory, lymph, extremities, GI, GU, Musculoskeletal, constitutional, breasts, reproductive, HEENT.  All pertinent positives are noted in the HPI.  All others are negative.   PHYSICAL EXAMINATION: ECOG PERFORMANCE STATUS: 1 - Symptomatic but completely ambulatory  . Vitals:   04/05/18 1441  BP: (!) 163/74  Pulse: 86  Resp: 18  Temp: 98.4 F (36.9 C)  SpO2: 97%   Filed Weights   04/05/18 1441  Weight: 173 lb 1.6 oz (78.5 kg)   .Body mass index is 32.71 kg/m.  GENERAL:alert, in no acute distress and comfortable SKIN: no acute rashes, no significant lesions EYES: conjunctiva are pink and non-injected, sclera anicteric OROPHARYNX: MMM, no exudates, no oropharyngeal erythema or ulceration NECK: supple, no JVD LYMPH:  no palpable lymphadenopathy in the cervical, axillary or inguinal regions LUNGS: clear to auscultation b/l with normal respiratory effort HEART: regular rate & rhythm ABDOMEN:  normoactive bowel sounds , non tender, not distended. No palpable hepatosplenomegaly.  Extremity: no pedal edema PSYCH: alert & oriented x 3 with fluent speech NEURO: no focal motor/sensory deficits   LABORATORY DATA:  I have reviewed the data as listed  . CBC Latest Ref Rng & Units 02/16/2018 08/12/2017 11/29/2016  WBC 3.4 - 10.8 x10E3/uL 8.5 9.1 9.3  Hemoglobin 11.1 - 15.9  g/dL 17.3(H) 16.4(H) 16.9(H)  Hematocrit 34.0 - 46.6 % 48.1(H) 48.2(H) 49.5(H)  Platelets 150 - 450 x10E3/uL 320 297 312   . CBC    Component Value Date/Time   WBC 8.5 02/16/2018 1027   WBC 8.5 11/18/2015 1111   RBC 5.32 (H) 02/16/2018 1027   RBC 5.12 (H) 11/18/2015 1111   HGB 17.3 (H) 02/16/2018 1027   HGB 16.4 (H) 06/01/2015 1504   HCT 48.1 (H) 02/16/2018 1027   HCT 46.0 06/01/2015 1504   PLT 320 02/16/2018 1027   MCV 90 02/16/2018 1027   MCV 92.0 06/01/2015 1504   MCH 32.5 02/16/2018 1027   MCH 32.0 11/18/2015 1111   MCHC 36.0 (H) 02/16/2018 1027   MCHC 34.8 11/18/2015 1111   RDW 12.7 02/16/2018 1027   RDW 13.1 06/01/2015 1504   LYMPHSABS 2.3 08/12/2017 1440   LYMPHSABS 2.3 06/01/2015 1504   MONOABS 510 11/18/2015 1111   MONOABS 0.7 06/01/2015 1504   EOSABS 0.5 (H) 08/12/2017 1440   BASOSABS 0.1 08/12/2017 1440   BASOSABS 0.1 06/01/2015 1504    . CMP Latest Ref Rng & Units 03/14/2018 03/14/2018 02/16/2018  Glucose 70 - 99 mg/dL - 104(H) -  BUN 6 - 23 mg/dL - 22 -  Creatinine 0.40 - 1.20 mg/dL - 1.19 -  Sodium 135 - 145 mEq/L - 138 -  Potassium 3.5 - 5.1 mEq/L - 4.5 -  Chloride 96 - 112 mEq/L - 99 -  CO2 19 - 32 mEq/L - 29 -  Calcium 8.6 - 10.4 mg/dL 10.6(H) 10.9(H) -  Total Protein 6.0 - 8.3 g/dL - 7.8 7.5  Total Bilirubin 0.2 - 1.2 mg/dL - 0.6 -  Alkaline Phos 39 - 117 U/L - 90 -  AST 0 - 37 U/L - 24 -  ALT 0 - 35 U/L - 25 -   06/01/15 JAK2 Genotypr:  RADIOGRAPHIC STUDIES: I have personally reviewed the radiological images as listed and agreed with the findings in the report. No results found.  ASSESSMENT & PLAN:   73 y.o. female with  #1 borderline polycythemia Likely secondary - hemoconcentration from HCTZ or Obesity with possible sleep apnea Jak2 V617F and Jak2 Exon 12 mutations are negative and makes polycythemia vera highly unlikely. PLAN: -Discussed pt labwork from 02/16/18; HGB at 17.3, HCT at 48.1 -Discussed that as we ruled out a  primary bone marrow problem at our last visit in 2017, her previously seen high HGB and HCT levels could be from her previous water pills, which she has since stopped since her last CBC -Also would recommend ruling out sleep apnea, with PCP, which could also cause reactive polycythemia -Would not consider a therapeutic phlebotomy unless HCT persists above 52% -The pt prefers not to repeat any further lab evaluation today, which is not unreasonable at this time  -06/01/15 Jak2 V617F mutation negative and JAk2 exon 12 mutation negative -Counseled the patient to maintain good hydration atleast 48-60oz of fluids daily -continue ASA daily as a blood thinner. -continue f/u with PCP  RTC with Dr Irene Limbo as needed   No orders of the defined types were placed in this encounter.   All of the patients questions were answered with apparent satisfaction. The patient knows to call the clinic with any problems, questions or concerns.  The total time spent in the appt was 25 minutes and more than 50% was on counseling and direct patient cares.    Sullivan Lone MD MS AAHIVMS [facility] Hematology/Oncology Physician [facility]  (Office):       [phone] (Work cell):  [phone] (Fax):           [phone]  04/05/2018 3:35 PM  I, Baldwin Jamaica, am acting as a scribe for Dr. Sullivan Lone.   .I have reviewed the above documentation for accuracy and completeness, and I agree with the above. Brunetta Genera MD

## 2018-04-06 ENCOUNTER — Telehealth: Payer: Self-pay

## 2018-04-06 NOTE — Telephone Encounter (Signed)
Per 1/2 no los 

## 2018-04-16 ENCOUNTER — Encounter: Payer: Self-pay | Admitting: Family Medicine

## 2018-04-25 ENCOUNTER — Ambulatory Visit: Payer: Medicare Other | Admitting: Internal Medicine

## 2018-05-10 ENCOUNTER — Ambulatory Visit: Payer: Medicare Other | Admitting: Internal Medicine

## 2018-05-15 ENCOUNTER — Encounter: Payer: Self-pay | Admitting: Internal Medicine

## 2018-05-15 ENCOUNTER — Ambulatory Visit (INDEPENDENT_AMBULATORY_CARE_PROVIDER_SITE_OTHER): Payer: Medicare Other | Admitting: Internal Medicine

## 2018-05-15 LAB — BASIC METABOLIC PANEL
BUN: 19 mg/dL (ref 6–23)
CO2: 29 mEq/L (ref 19–32)
Calcium: 10 mg/dL (ref 8.4–10.5)
Chloride: 104 mEq/L (ref 96–112)
Creatinine, Ser: 1.05 mg/dL (ref 0.40–1.20)
GFR: 51.79 mL/min — ABNORMAL LOW (ref >60.00)
Glucose, Bld: 104 mg/dL — ABNORMAL HIGH (ref 70–99)
Potassium: 4.9 mEq/L (ref 3.5–5.1)
Sodium: 142 mEq/L (ref 135–145)

## 2018-05-15 LAB — ALBUMIN: Albumin: 4.4 g/dL (ref 3.5–5.2)

## 2018-05-15 NOTE — Progress Notes (Signed)
Name: Sierra Carpenter  MRN/ DOB: [id number], [birth year]    Age/ Sex: 73 y.o., female     PCP: Rutherford Guys, MD   Reason for Endocrinology Evaluation: Hypercalcemia      Initial Endocrinology Clinic Visit: 03/14/2018    PATIENT IDENTIFIER: Sierra Carpenter is a 73 y.o., female with a past medical history of HTN. She has followed with [facility] since 03/14/18 for consultative assistance with management of her hypercalcemia .   HISTORICAL SUMMARY: The patient was first diagnosed with hypercalcemia in 2017,during routine work up. On her initial visit she was on Diovan -HCT at the time.  She has a history of kidney stones many years ago (1980's), she has kidney disease, she denies liver disease, granulomatous disease. She denies osteoporosis or prior fractures. Daily dietary calcium intake: 2 servings.  She denies family history of osteoporosis, parathyroid disease.   Two brothers and mother with thyroid disease (hypothyroidism)  SUBJECTIVE:   During last visit (03/14/18): Her corrected calcium was 11.26 while on calcium and HCTZ. She had already stopped her calcium couple weeks prior to her presentation to our clinic.  Today (05/15/2018):  [name] is here for a 2 months follow for hypercalcemia.  Since her last visit she has seen the oncologist for polycythemia .  She continues to stay off calcium supplements, she also has been off the hydrochlorothiazide.  She denies any fatigue, polydipsia and polyuria.  She denies constipation, tingling and numbness    ROS:  As per HPI.   HISTORY:  Past Medical History:  Past Medical History:  Diagnosis Date  . Aortic valve sclerosis   . Coronary atherosclerosis    without significant obstruction  . Heart murmur   . History of nuclear stress test 07/12/2010   bruce protocol myoview; normal pattern of perfusion, low risk, post-stress EF 92%  . Hyperlipidemia   . Hypertension    Past Surgical History:  Past  Surgical History:  Procedure Laterality Date  . ABDOMINAL HYSTERECTOMY     fibroids; ovaries intact.  . APPENDECTOMY    . CARDIAC CATHETERIZATION     moderate stenosis in 1st diagonal & RCA  . CESAREAN SECTION    . CHOLECYSTECTOMY    . EYE SURGERY    . TRANSTHORACIC ECHOCARDIOGRAM  07/12/2010   EF=>55% with normal systolic function; trace MR/TR/PR    Social History:  reports that she has never smoked. She has never used smokeless tobacco. She reports that she does not drink alcohol or use drugs.  Family History: family history includes Alzheimer's disease in her mother; COPD in her brother; Heart disease in her brother, brother, brother, paternal grandfather, and son; Heart disease (age of onset: 57) in her father.   HOME MEDICATIONS: Allergies as of 05/15/2018   No Known Allergies     Medication List       Accurate as of May 15, 2018  9:51 AM. Always use your most recent med list.        aspirin 81 MG tablet Take 1 tablet (81 mg total) by mouth daily.   atorvastatin 40 MG tablet Commonly known as:  LIPITOR Take 1 tablet (40 mg total) by mouth daily.   co-enzyme Q-10 30 MG capsule Take 100 mg by mouth 2 (two) times daily.   fish oil-omega-3 fatty acids 1000 MG capsule Take 1 capsule by mouth 3 (three) times daily.   meloxicam 7.5 MG tablet Commonly known as:  MOBIC Take 1 tablet (7.5 mg  total) by mouth daily.   oxybutynin 5 MG tablet Commonly known as:  DITROPAN Take 1 tablet (5 mg total) by mouth 2 (two) times daily.   valsartan 160 MG tablet Commonly known as:  DIOVAN Take 1 tablet (160 mg total) by mouth daily.   VITAMIN C PO Take by mouth daily.         OBJECTIVE:   PHYSICAL EXAM: VS: BP 140/76 (BP Location: Right Arm, Patient Position: Sitting, Cuff Size: Normal)   Pulse 76   Resp 12   Ht 5' 1.5" (1.562 m)   Wt 176 lb (79.8 kg)   SpO2 96%   BMI 32.72 kg/m    EXAM: General: Pt appears well and is in NAD  Neck: General: Supple without  adenopathy. Thyroid: Thyroid size normal.  No goiter or nodules appreciated. No thyroid bruit.  Lungs: Clear with good BS bilat with no rales, rhonchi, or wheezes  Heart: Auscultation: RRR.  Abdomen: Normoactive bowel sounds, soft, nontender, without masses or organomegaly palpable  Extremities:  BL LE: No pretibial edema normal ROM and strength.  Neuro: Cranial nerves: II - XII grossly intact  Cerebellar: Normal coordination and movement; no tremor Motor: Normal strength throughout DTRs: 2+ and symmetric in UE without delay in relaxation phase  Mental Status: Judgment, insight: Intact Orientation: Oriented to time, place, and person Mood and affect: No depression, anxiety, or agitation     DATA REVIEWED:  Results for Sierra, Carpenter (MRN [id number]) as of 05/15/2018 18:48  Ref. Range 05/15/2018 10:15  Sodium Latest Ref Range: 135 - 145 mEq/L 142  Potassium Latest Ref Range: 3.5 - 5.1 mEq/L 4.9  Chloride Latest Ref Range: 96 - 112 mEq/L 104  CO2 Latest Ref Range: 19 - 32 mEq/L 29  Glucose Latest Ref Range: 70 - 99 mg/dL 104 (H)  BUN Latest Ref Range: 6 - 23 mg/dL 19  Creatinine Latest Ref Range: 0.40 - 1.20 mg/dL 1.05  Calcium Latest Ref Range: 8.4 - 10.5 mg/dL 10.0  Albumin Latest Ref Range: 3.5 - 5.2 g/dL 4.4  GFR Latest Ref Range: >60.00 mL/min 51.79 (L)    Corrected 9.68 mg/dL  ASSESSMENT / PLAN / RECOMMENDATIONS:   1. Non-PTH mediated hypercalcemia :  -This has resolved with a corrected serum calcium of 9.68 mg/dL on today's labs. - Her hypercalcemia is most likely a combination of calcium supplements, hydrochlorothiazide, multivitamins as well as hypervitaminosis A. -She was advised to remain off vitamin supplements that include vitamin A and calcium, she was also advised not to avoid hydrochlorothiazide.   Follow-up as needed   Signed electronically by: Mack Guise, MD  [facility] End[facility]  [facility] [place], [place] [address] Phone: [phone] FAX: [phone]      CC: Rutherford Guys, MD [address] [place] [address] Phone: [phone]  Fax: [phone]   Return to Endocrinology clinic as below: Future Appointments  Date Time Provider [place]  08/21/2018  9:20 AM Rutherford Guys, MD PCP-PCP [facility]

## 2018-05-17 ENCOUNTER — Other Ambulatory Visit: Payer: Self-pay | Admitting: *Deleted

## 2018-05-17 ENCOUNTER — Encounter: Payer: Self-pay | Admitting: Family Medicine

## 2018-05-17 LAB — VITAMIN A: Vitamin A (Retinoic Acid): 80 ug/dL (ref 38–98)

## 2018-05-17 LAB — PTH, INTACT AND CALCIUM
CALCIUM: 10.2 mg/dL (ref 8.6–10.4)
PTH: 12 pg/mL — ABNORMAL LOW (ref 14–64)

## 2018-05-17 MED ORDER — VALSARTAN 160 MG PO TABS: 160.0000 mg | ORAL_TABLET | Freq: Every day | ORAL | 0 refills | Status: DC

## 2018-06-11 DIAGNOSIS — L814 Other melanin hyperpigmentation: Secondary | ICD-10-CM | POA: Diagnosis not present

## 2018-06-11 DIAGNOSIS — L821 Other seborrheic keratosis: Secondary | ICD-10-CM | POA: Diagnosis not present

## 2018-06-11 DIAGNOSIS — D1801 Hemangioma of skin and subcutaneous tissue: Secondary | ICD-10-CM | POA: Diagnosis not present

## 2018-06-11 DIAGNOSIS — D225 Melanocytic nevi of trunk: Secondary | ICD-10-CM | POA: Diagnosis not present

## 2018-06-12 ENCOUNTER — Encounter: Payer: Self-pay | Admitting: Family Medicine

## 2018-06-12 ENCOUNTER — Other Ambulatory Visit: Payer: Self-pay | Admitting: Family Medicine

## 2018-06-12 MED ORDER — AMLODIPINE BESYLATE 10 MG PO TABS: 10.0000 mg | ORAL_TABLET | Freq: Every day | ORAL | 0 refills | Status: DC

## 2018-06-21 ENCOUNTER — Encounter: Payer: Self-pay | Admitting: Family Medicine

## 2018-06-21 NOTE — Telephone Encounter (Signed)
I managed to reach Sierra Carpenter. She will try taking amlodipine 5 mg once daily and valsartan 80 mg once daily (half of each tab) and call back with symptoms/BP readings in about a week. MCr

## 2018-06-26 ENCOUNTER — Ambulatory Visit: Payer: Medicare Other | Admitting: Family Medicine

## 2018-07-02 ENCOUNTER — Telehealth: Payer: Self-pay | Admitting: Cardiovascular Disease

## 2018-07-02 NOTE — Telephone Encounter (Signed)
Yes, stop amlodipine and call in the Rx for the valsartan, 3 month suply, 3 refills, in the dose that she is currently taking.  MCr

## 2018-07-02 NOTE — Telephone Encounter (Signed)
Spoke with pt, she has been taking the amlodipine and valsartan as she and dr c discussed. She reports the swelling got back again and she was frustrated so she stopped the amlodipine Saturday. She has not taken any in the last 2 days and the swelling is greatly improved. She continues to take the valsartan 80 mg once daily and her bp is running 125/72, 133/69 and 136/70. She would prefer to stay off the amlodipine and continue on valsartan if okay with dr c. She will need a refill if approved by dr c. Will forward to dr croitoru to review and advise.

## 2018-07-02 NOTE — Telephone Encounter (Signed)
New Message    Pt c/o medication issue:  1. Name of Medication:  Valsartin 160mg   2. How are you currently taking this medication (dosage and times per day)? Not taking  3. Are you having a reaction (difficulty breathing--STAT)? No  4. What is your medication issue? Pt wants to start taking this mediation again because the Amlodipine 10mg  she says is not working for her   She said she did send a Mychart message on Friday and has not heard back

## 2018-07-03 MED ORDER — VALSARTAN 80 MG PO TABS: 80.0000 mg | ORAL_TABLET | Freq: Every day | ORAL | 3 refills | Status: DC

## 2018-07-03 NOTE — Telephone Encounter (Signed)
Refill sent to the pharmacy electronically.  

## 2018-07-11 ENCOUNTER — Telehealth: Payer: Self-pay | Admitting: Family Medicine

## 2018-07-11 NOTE — Telephone Encounter (Signed)
Patient left voicemail message requesting a refill of meloxicam (MOBIC) 7.5 MG tablet.

## 2018-07-12 ENCOUNTER — Other Ambulatory Visit: Payer: Self-pay | Admitting: *Deleted

## 2018-07-12 ENCOUNTER — Encounter: Payer: Self-pay | Admitting: Family Medicine

## 2018-07-12 MED ORDER — ATORVASTATIN CALCIUM 40 MG PO TABS: 40.0000 mg | ORAL_TABLET | Freq: Every day | ORAL | 3 refills | Status: DC

## 2018-07-13 ENCOUNTER — Other Ambulatory Visit: Payer: Self-pay | Admitting: Family Medicine

## 2018-07-13 MED ORDER — MELOXICAM 7.5 MG PO TABS: 7.5000 mg | ORAL_TABLET | Freq: Every day | ORAL | 0 refills | Status: DC

## 2018-07-13 NOTE — Telephone Encounter (Signed)
Requested Prescriptions  Pending Prescriptions Disp Refills  . meloxicam (MOBIC) 7.5 MG tablet 30 tablet 0    Sig: Take 1 tablet (7.5 mg total) by mouth daily.     Analgesics:  COX2 Inhibitors Failed - 07/13/2018  9:45 AM      Failed - HGB in normal range and within 360 days    Hemoglobin  Date Value Ref Range Status  02/16/2018 17.3 (H) 11.1 - 15.9 g/dL Final   HGB  Date Value Ref Range Status  06/01/2015 16.4 (H) 11.6 - 15.9 g/dL Final         Passed - Cr in normal range and within 360 days    Creatinine  Date Value Ref Range Status  06/01/2015 1.1 0.6 - 1.1 mg/dL Final   Creat  Date Value Ref Range Status  11/18/2015 1.00 (H) 0.50 - 0.99 mg/dL Final    Comment:      For patients > or = 71 years of age: The upper reference limit for Creatinine is approximately 13% higher for people identified as African-American.      Creatinine, Ser  Date Value Ref Range Status  05/15/2018 1.05 0.40 - 1.20 mg/dL Final         Passed - Patient is not pregnant      Passed - Valid encounter within last 12 months    Recent Outpatient Visits          4 months ago Hypercalcemia   [facility], Lilia Argue, MD   11 months ago Encounter for [employer] annual wellness exam   Primary Care at [facility], Renette Butters, MD   1 year ago Hypercalcemia   Primary Care at [facility], Ines Bloomer, MD   1 year ago Essential hypertension   Primary Care at [facility], Renette Butters, MD   1 year ago Cyst of right knee joint   Primary Care at [facility], Ines Bloomer, MD      Future Appointments            In 1 month Pamella Pert, Lilia Argue, MD Primary Care at [place], [facility]

## 2018-07-13 NOTE — Telephone Encounter (Signed)
Please refill if possible was last refilled in May/2019.

## 2018-07-13 NOTE — Addendum Note (Signed)
Addended by: Rutherford Guys on: 07/13/2018 12:14 PM   Modules accepted: Orders

## 2018-07-13 NOTE — Telephone Encounter (Signed)
Pt left message on voicemail requesting refill on meloxicam.

## 2018-07-13 NOTE — Telephone Encounter (Signed)
Pt called in stating she has 2 other medications ready for pick up and she would like to pick up the meloxicam today as well. She is requesting 90 day supply so that she doesn't have to go back to [place] during [place].   [place] ([address] [place] [phone] (Phone) [phone] (Fax)

## 2018-08-16 ENCOUNTER — Other Ambulatory Visit: Payer: Self-pay | Admitting: Family Medicine

## 2018-08-21 ENCOUNTER — Ambulatory Visit: Payer: Medicare Other | Admitting: Family Medicine

## 2018-09-06 ENCOUNTER — Ambulatory Visit (INDEPENDENT_AMBULATORY_CARE_PROVIDER_SITE_OTHER): Payer: Medicare Other | Admitting: Family Medicine

## 2018-09-06 ENCOUNTER — Other Ambulatory Visit: Payer: Self-pay

## 2018-09-06 ENCOUNTER — Encounter: Payer: Self-pay | Admitting: Family Medicine

## 2018-09-06 VITALS — BP 136/75 | HR 97 | Temp 98.2°F

## 2018-09-06 DIAGNOSIS — W57XXXA Bitten or stung by nonvenomous insect and other nonvenomous arthropods, initial encounter: Secondary | ICD-10-CM

## 2018-09-06 DIAGNOSIS — E7800 Pure hypercholesterolemia, unspecified: Secondary | ICD-10-CM

## 2018-09-06 DIAGNOSIS — Z0000 Encounter for general adult medical examination without abnormal findings: Secondary | ICD-10-CM

## 2018-09-06 DIAGNOSIS — I350 Nonrheumatic aortic (valve) stenosis: Secondary | ICD-10-CM

## 2018-09-06 DIAGNOSIS — I2510 Atherosclerotic heart disease of native coronary artery without angina pectoris: Secondary | ICD-10-CM | POA: Diagnosis not present

## 2018-09-06 DIAGNOSIS — I10 Essential (primary) hypertension: Secondary | ICD-10-CM

## 2018-09-06 DIAGNOSIS — D751 Secondary polycythemia: Secondary | ICD-10-CM | POA: Diagnosis not present

## 2018-09-06 DIAGNOSIS — E559 Vitamin D deficiency, unspecified: Secondary | ICD-10-CM | POA: Diagnosis not present

## 2018-09-06 DIAGNOSIS — Z0001 Encounter for general adult medical examination with abnormal findings: Secondary | ICD-10-CM | POA: Diagnosis not present

## 2018-09-06 MED ORDER — DOXYCYCLINE HYCLATE 100 MG PO TABS: 100.0000 mg | ORAL_TABLET | Freq: Two times a day (BID) | ORAL | 0 refills | Status: DC

## 2018-09-06 NOTE — Progress Notes (Signed)
Presents today for [employer] Visit-Subsequent.   Date of last exam: 08/2017  Interpreter used for this visit? no  Patient Care Team: Rutherford Guys, MD as PCP - General (Family Medicine) Croitoru, Dani Gobble, MD as Consulting Physician (Cardiology) Debbra Riding, MD as Consulting Physician (Ophthalmology)   Other items to address today:  Last OV nov 2019 Since then has seen cards and endo - unremarkable workups Has had 2 recent deaths, including her best friend from renal cancer - doing ok Had a tick bite 4 days ago, unsure type of tick   Cancer Screening: Cervical: n/a Breast: mammo done July 2019 Colon: h/o polyps, due 2021   Other Screening: Last screening for diabetes: yearly due to RF Last lipid screening: has HLP  Lab Results  Component Value Date   CHOL 149 08/12/2017   HDL 46 08/12/2017   LDLCALC 73 08/12/2017   TRIG 150 (H) 08/12/2017   CHOLHDL 3.2 08/12/2017   dexa - 2017, normal   ADVANCE DIRECTIVES: Discussed: yes Patient desires CPR (Yes ), mechanical ventilation (Yes ), prolonged artificial support (may include mechanical ventilation, tube/PEG feeding, etc) (No ). On File: no, will bring in once notorized Materials Provided: no  Immunization status:  Immunization History  Administered Date(s) Administered  . Influenza Split 12/08/2012  . Influenza,inj,Quad PF,6+ Mos 12/18/2014, 11/29/2016  . Influenza-Unspecified 12/10/2013, 11/30/2015, 06/01/2016  . Pneumococcal Conjugate-13 12/10/2013  . Pneumococcal Polysaccharide-23 12/18/2014  . Tdap 08/07/2010  reports completed shingrix series   There are no preventive care reminders to display for this patient.   Functional Status Survey: Is the patient deaf or have difficulty hearing?: Yes(at times, thinks it has something to do with wearing aids at one time) Does the patient have difficulty seeing, even when wearing glasses/contacts?: No Does the patient have difficulty  concentrating, remembering, or making decisions?: No Does the patient have difficulty walking or climbing stairs?: No Does the patient have difficulty dressing or bathing?: No Does the patient have difficulty doing errands alone such as visiting a doctor's office or shopping?: No   6CIT Screen 09/06/2018  What Year? 0 points  What month? 0 points  What time? 0 points  Count back from 20 0 points  Months in reverse 0 points  Repeat phrase 0 points  Total Score 0     Home Environment: safe, no fall risks  Urinary Incontinence Screening: denies any issues since being on oxybutynin  Patient Active Problem List   Diagnosis Date Noted  . Urge incontinence 08/12/2017  . Class 1 obesity due to excess calories with serious comorbidity and body mass index (BMI) of 31.0 to 31.9 in adult 06/13/2016  . Paresthesia of lower lip 06/06/2016  . Non-rheumatic aortic stenosis 08/28/2015  . Polycythemia 06/01/2015  . Coronary atherosclerosis 01/01/2013  . Essential hypertension 05/09/2011  . Hypercholesterolemia 05/09/2011  . Colon polyps 05/09/2011     Past Medical History:  Diagnosis Date  . Aortic valve sclerosis   . Coronary atherosclerosis    without significant obstruction  . Heart murmur   . History of nuclear stress test 07/12/2010   bruce protocol myoview; normal pattern of perfusion, low risk, post-stress EF 92%  . Hyperlipidemia   . Hypertension      Past Surgical History:  Procedure Laterality Date  . ABDOMINAL HYSTERECTOMY     fibroids; ovaries intact.  . APPENDECTOMY    . CARDIAC CATHETERIZATION     moderate stenosis in 1st diagonal & RCA  .  CESAREAN SECTION    . CHOLECYSTECTOMY    . EYE SURGERY    . TRANSTHORACIC ECHOCARDIOGRAM  07/12/2010   EF=>55% with normal systolic function; trace MR/TR/PR     Family History  Problem Relation Age of Onset  . Heart disease Father 85       AMI  . Heart disease Brother   . Heart disease Son   . Heart disease Paternal  Grandfather   . Alzheimer's disease Mother   . Heart disease Brother        CHF/CAD  . COPD Brother   . Heart disease Brother   . Breast cancer Neg Hx      Social History   Socioeconomic History  . Marital status: Widowed    Spouse name: Not on file  . Number of children: 1  . Years of education: 5  . Highest education level: Not on file  Occupational History  . Occupation: retired    Comment: Pensions consultant and [employer]  Social Needs  . Financial resource strain: Not on file  . Food insecurity:    Worry: Not on file    Inability: Not on file  . Transportation needs:    Medical: Not on file    Non-medical: Not on file  Tobacco Use  . Smoking status: Never Smoker  . Smokeless tobacco: Never Used  Substance and Sexual Activity  . Alcohol use: No  . Drug use: No  . Sexual activity: Yes    Birth control/protection: Post-menopausal, Surgical  Lifestyle  . Physical activity:    Days per week: Not on file    Minutes per session: Not on file  . Stress: Not on file  Relationships  . Social connections:    Talks on phone: Not on file    Gets together: Not on file    Attends religious service: Not on file    Active member of club or organization: Not on file    Attends meetings of clubs or organizations: Not on file    Relationship status: Not on file  . Intimate partner violence:    Fear of current or ex partner: Not on file    Emotionally abused: Not on file    Physically abused: Not on file    Forced sexual activity: Not on file  Other Topics Concern  . Not on file  Social History Narrative   Marital status: widowed since 2014 due to COPD; married x 37 years.      Children: 1 daughter; 1 son died of CHF age 69 pacemaker.  4 grandchildren.      Lives: alone      Employment: retired at age 13 from [employer].      Tobacco:  never       Alcohol:  never      Exercise: yes in 2019; walking 1-2 miles daily.  DDD lumbar limiting walking.      ADLs: independent with ADLs;  drives.        Advanced Directives: none; has paper.  FULL CODE; no prolonged measures.  HCPOA: daughter/Misty Wenn-Maness.     Allergies  Allergen Reactions  . Hydrochlorothiazide     Hypercalcemia      Prior to Admission medications   Medication Sig Start Date End Date Taking? Authorizing Provider  Ascorbic Acid (VITAMIN C PO) Take by mouth daily.   Yes [provider]  aspirin 81 MG tablet Take 1 tablet (81 mg total) by mouth daily. 11/29/16  Yes Wardell Honour,  MD  atorvastatin (LIPITOR) 40 MG tablet Take 1 tablet (40 mg total) by mouth daily. 07/12/18  Yes Croitoru, Mihai, MD  co-enzyme Q-10 30 MG capsule Take 100 mg by mouth 2 (two) times daily.   Yes [provider]  fish oil-omega-3 fatty acids 1000 MG capsule Take 1 capsule by mouth 3 (three) times daily.   Yes [provider]  meloxicam (MOBIC) 7.5 MG tablet Take 1 tablet (7.5 mg total) by mouth daily. 07/13/18  Yes Rutherford Guys, MD  Multiple Vitamins-Minerals (RA VISION-VITE PRESERVE PO) Take by mouth.   Yes [provider]  oxybutynin (DITROPAN) 5 MG tablet Take 1 tablet by mouth twice daily 08/16/18  Yes Rutherford Guys, MD  valsartan (DIOVAN) 80 MG tablet Take 1 tablet (80 mg total) by mouth daily. 07/03/18  Yes Croitoru, Dani Gobble, MD  White Petrolatum-Mineral Oil (SYSTANE NIGHTTIME OP) Apply to eye.   Yes [provider]     Depression screen [facility] 2/9 09/06/2018 02/16/2018 02/16/2018 08/12/2017 11/29/2016  Decreased Interest 0 0 0 0 0  Down, Depressed, Hopeless 0 0 0 0 0  PHQ - 2 Score 0 0 0 0 0     Fall Risk  09/06/2018 02/16/2018 02/16/2018 08/12/2017 11/29/2016  Falls in the past year? 0 0 0 No No  Number falls in past yr: 0 - - - -  Injury with Fall? 0 - - - -    Review of Systems  Constitutional: Negative for chills and fever.  Respiratory: Negative for cough and shortness of breath.   Cardiovascular: Positive for palpitations (rare palpitations, short lived, when really  hot, no associated sx). Negative for chest pain and leg swelling.  Gastrointestinal: Negative for abdominal pain, nausea and vomiting.  All other systems reviewed and are negative. 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  HENT:  Head: Normocephalic and atraumatic.  Right Ear: Hearing, tympanic membrane, external ear and ear canal normal.  Left Ear: Hearing, tympanic membrane, external ear and ear canal normal.  Mouth/Throat: Oropharynx is clear and moist.  Eyes: Pupils are equal, round, and reactive to light. EOM are normal.  Neck: Neck supple. No thyromegaly present.  Cardiovascular: Normal rate, regular rhythm, normal heart sounds and intact distal pulses. Exam reveals no gallop and no friction rub.  No murmur heard. Pulmonary/Chest: Effort normal and breath sounds normal. She has no wheezes. She has no rales.  Abdominal: Soft. Bowel sounds are normal. She exhibits no distension and no mass. There is no abdominal tenderness.  Musculoskeletal: Normal range of motion.        General: No edema.  Lymphadenopathy:    She has no cervical adenopathy.  Neurological: She is alert and oriented to person, place, and time. She has normal reflexes. Gait normal.  Skin: Skin is warm and dry.  Left hip area with mild swelling and erythema,  excoriated.  Psychiatric: Mood and affect normal.  Nursing note and vitals reviewed.   Education/Counseling provided regarding diet and exercise, prevention of  chronic diseases, smoking/tobacco cessation, if applicable, and reviewed "Covered Medicare Preventive Services."   ASSESSMENT/PLAN: 1. Encounter for Medicare annual wellness exam Routine HCM labs ordered. HCM reviewed/discussed. Anticipatory guidance regarding healthy weight, lifestyle and choices given.   2. Essential hypertension Controlled. Continue current regime.  - CMP14+EGFR  3. Hypercholesterolemia Checking labs today, medications will be adjusted as needed.  - Lipid panel - TSH  4. Polycythemia Per heme, no phlebotomy needed unless HCT > 52% - CBC  5. Atherosclerosis of native coronary artery of native heart without angina pectoris 6. Non-rheumatic aortic stenosis Managed by cards  7. Vitamin D deficiency - VITAMIN D 25 Hydroxy (Vit-D Deficiency, Fractures)  8. Tick bite, initial encounter - doxycycline (VIBRA-TABS) 100 MG tablet; Take 1 tablet (100 mg total) by mouth 2 (two) times daily.    Return in about 6 months (around 03/08/2019).

## 2018-09-06 NOTE — Patient Instructions (Addendum)
   If you have lab work done today you will be contacted with your lab results within the next 2 weeks.  If you have not heard from us then please contact us. The fastest way to get your results is to register for My Chart.   IF you received an x-ray today, you will receive an invoice from [facility]. Please contact  Radiology at [phone] with questions or concerns regarding your invoice.   IF you received labwork today, you will receive an invoice from [facility]. Please contact [facility] at [phone] with questions or concerns regarding your invoice.   Our billing staff will not be able to assist you with questions regarding bills from these companies.  You will be contacted with the lab results as soon as they are available. The fastest way to get your results is to activate your My Chart account. Instructions are located on the last page of this paperwork. If you have not heard from us regarding the results in 2 weeks, please contact this office.     Preventive Care 71 Years and Older, Female Preventive care refers to lifestyle choices and visits with your health care provider that can promote health and wellness. What does preventive care include?  A yearly physical exam. This is also called an annual well check.  Dental exams once or twice a year.  Routine eye exams. Ask your health care provider how often you should have your eyes checked.  Personal lifestyle choices, including: ? Daily care of your teeth and gums. ? Regular physical activity. ? Eating a healthy diet. ? Avoiding tobacco and drug use. ? Limiting alcohol use. ? Practicing safe sex. ? Taking low-dose aspirin every day. ? Taking vitamin and mineral supplements as recommended by your health care provider. What happens during an annual well check? The services and screenings done by your health care provider during your annual well check will depend on your age, overall health, lifestyle  risk factors, and family history of disease. Counseling Your health care provider may ask you questions about your:  Alcohol use.  Tobacco use.  Drug use.  Emotional well-being.  Home and relationship well-being.  Sexual activity.  Eating habits.  History of falls.  Memory and ability to understand (cognition).  Work and work environment.  Reproductive health.  Screening You may have the following tests or measurements:  Height, weight, and BMI.  Blood pressure.  Lipid and cholesterol levels. These may be checked every 5 years, or more frequently if you are over 50 years old.  Skin check.  Lung cancer screening. You may have this screening every year starting at age 55 if you have a 30-pack-year history of smoking and currently smoke or have quit within the past 15 years.  Colorectal cancer screening. All adults should have this screening starting at age 50 and continuing until age 75. You will have tests every 1-10 years, depending on your results and the type of screening test. People at increased risk should start screening at an earlier age. Screening tests may include: ? Guaiac-based fecal occult blood testing. ? Fecal immunochemical test (FIT). ? Stool DNA test. ? Virtual colonoscopy. ? Sigmoidoscopy. During this test, a flexible tube with a tiny camera (sigmoidoscope) is used to examine your rectum and lower colon. The sigmoidoscope is inserted through your anus into your rectum and lower colon. ? Colonoscopy. During this test, a long, thin, flexible tube with a tiny camera (colonoscope) is used to examine your entire colon   and rectum.  Hepatitis C blood test.  Hepatitis B blood test.  Sexually transmitted disease (STD) testing.  Diabetes screening. This is done by checking your blood sugar (glucose) after you have not eaten for a while (fasting). You may have this done every 1-3 years.  Bone density scan. This is done to screen for osteoporosis. You may  have this done starting at age 65.  Mammogram. This may be done every 1-2 years. Talk to your health care provider about how often you should have regular mammograms. Talk with your health care provider about your test results, treatment options, and if necessary, the need for more tests. Vaccines Your health care provider may recommend certain vaccines, such as:  Influenza vaccine. This is recommended every year.  Tetanus, diphtheria, and acellular pertussis (Tdap, Td) vaccine. You may need a Td booster every 10 years.  Varicella vaccine. You may need this if you have not been vaccinated.  Zoster vaccine. You may need this after age 60.  Measles, mumps, and rubella (MMR) vaccine. You may need at least one dose of MMR if you were born in 1957 or later. You may also need a second dose.  Pneumococcal 13-valent conjugate (PCV13) vaccine. One dose is recommended after age 65.  Pneumococcal polysaccharide (PPSV23) vaccine. One dose is recommended after age 65.  Meningococcal vaccine. You may need this if you have certain conditions.  Hepatitis A vaccine. You may need this if you have certain conditions or if you travel or work in places where you may be exposed to hepatitis A.  Hepatitis B vaccine. You may need this if you have certain conditions or if you travel or work in places where you may be exposed to hepatitis B.  Haemophilus influenzae type b (Hib) vaccine. You may need this if you have certain conditions. Talk to your health care provider about which screenings and vaccines you need and how often you need them. This information is not intended to replace advice given to you by your health care provider. Make sure you discuss any questions you have with your health care provider. Document Released: 04/17/2015 Document Revised: 05/11/2017 Document Reviewed: 01/20/2015 Elsevier Interactive Patient Education  2019 Elsevier Inc.  

## 2018-09-07 LAB — CBC
Hematocrit: 47.4 % — ABNORMAL HIGH (ref 34.0–46.6)
Hemoglobin: 16.2 g/dL — ABNORMAL HIGH (ref 11.1–15.9)
MCH: 31.6 pg (ref 26.6–33.0)
MCHC: 34.2 g/dL (ref 31.5–35.7)
MCV: 92 fL (ref 79–97)
Platelets: 288 10*3/uL (ref 150–450)
RBC: 5.13 x10E6/uL (ref 3.77–5.28)
RDW: 13.1 % (ref 11.7–15.4)
WBC: 10.1 10*3/uL (ref 3.4–10.8)

## 2018-09-07 LAB — CMP14+EGFR
ALT: 25 IU/L (ref 0–32)
AST: 22 IU/L (ref 0–40)
Albumin/Globulin Ratio: 2.3 — ABNORMAL HIGH (ref 1.2–2.2)
Albumin: 4.8 g/dL (ref 3.8–4.8)
Alkaline Phosphatase: 121 IU/L — ABNORMAL HIGH (ref 39–117)
BUN/Creatinine Ratio: 15 (ref 12–28)
BUN: 16 mg/dL (ref 8–27)
Bilirubin Total: 0.5 mg/dL (ref 0.0–1.2)
CO2: 16 mmol/L — ABNORMAL LOW (ref 20–29)
Calcium: 9.9 mg/dL (ref 8.7–10.3)
Chloride: 105 mmol/L (ref 96–106)
Creatinine, Ser: 1.07 mg/dL — ABNORMAL HIGH (ref 0.57–1.00)
GFR calc Af Amer: 61 mL/min/{1.73_m2} (ref >59)
GFR calc non Af Amer: 53 mL/min/{1.73_m2} — ABNORMAL LOW (ref >59)
Globulin, Total: 2.1 g/dL (ref 1.5–4.5)
Glucose: 93 mg/dL (ref 65–99)
Potassium: 4.2 mmol/L (ref 3.5–5.2)
Sodium: 146 mmol/L — ABNORMAL HIGH (ref 134–144)
Total Protein: 6.9 g/dL (ref 6.0–8.5)

## 2018-09-07 LAB — VITAMIN D 25 HYDROXY (VIT D DEFICIENCY, FRACTURES): Vit D, 25-Hydroxy: 29.5 ng/mL — ABNORMAL LOW (ref 30.0–100.0)

## 2018-09-07 LAB — LIPID PANEL
Chol/HDL Ratio: 4.1 ratio (ref 0.0–4.4)
Cholesterol, Total: 192 mg/dL (ref 100–199)
HDL: 47 mg/dL (ref >39)
LDL Calculated: 118 mg/dL — ABNORMAL HIGH (ref 0–99)
Triglycerides: 136 mg/dL (ref 0–149)
VLDL Cholesterol Cal: 27 mg/dL (ref 5–40)

## 2018-09-07 LAB — TSH: TSH: 2.27 u[IU]/mL (ref 0.450–4.500)

## 2018-09-12 ENCOUNTER — Other Ambulatory Visit: Payer: Self-pay | Admitting: Family Medicine

## 2018-09-12 DIAGNOSIS — Z9289 Personal history of other medical treatment: Secondary | ICD-10-CM

## 2018-09-15 ENCOUNTER — Encounter: Payer: Self-pay | Admitting: Family Medicine

## 2018-09-16 ENCOUNTER — Other Ambulatory Visit: Payer: Self-pay | Admitting: Family Medicine

## 2018-10-13 ENCOUNTER — Other Ambulatory Visit: Payer: Self-pay | Admitting: Family Medicine

## 2018-10-14 NOTE — Telephone Encounter (Signed)
Requested Prescriptions  Pending Prescriptions Disp Refills  . meloxicam (MOBIC) 7.5 MG tablet [Pharmacy Med Name: Meloxicam 7.5 MG Oral Tablet] 30 tablet 0    Sig: Take 1 tablet by mouth once daily     Analgesics:  COX2 Inhibitors Failed - 10/13/2018  5:43 PM      Failed - HGB in normal range and within 360 days    Hemoglobin  Date Value Ref Range Status  09/06/2018 16.2 (H) 11.1 - 15.9 g/dL Final   HGB  Date Value Ref Range Status  06/01/2015 16.4 (H) 11.6 - 15.9 g/dL Final         Failed - Cr in normal range and within 360 days    Creatinine  Date Value Ref Range Status  06/01/2015 1.1 0.6 - 1.1 mg/dL Final   Creat  Date Value Ref Range Status  11/18/2015 1.00 (H) 0.50 - 0.99 mg/dL Final    Comment:      For patients > or = 71 years of age: The upper reference limit for Creatinine is approximately 13% higher for people identified as African-American.      Creatinine, Ser  Date Value Ref Range Status  09/06/2018 1.07 (H) 0.57 - 1.00 mg/dL Final         Passed - Patient is not pregnant      Passed - Valid encounter within last 12 months    Recent Outpatient Visits          1 month ago Encounter for [employer] annual wellness exam   [facility], Lilia Argue, MD   8 months ago Hypercalcemia   [facility], Lilia Argue, MD   1 year ago Encounter for Medicare annual wellness exam   Primary Care at [facility], Renette Butters, MD   1 year ago Hypercalcemia   Primary Care at [facility], Ines Bloomer, MD   1 year ago Essential hypertension   Primary Care at [facility], Renette Butters, MD      Future Appointments            In 4 months Rutherford Guys, MD Primary Care at [place], [facility]

## 2018-10-15 ENCOUNTER — Encounter: Payer: Self-pay | Admitting: Family Medicine

## 2018-10-31 ENCOUNTER — Ambulatory Visit
Admission: RE | Admit: 2018-10-31 | Discharge: 2018-10-31 | Disposition: A | Discharge status: Home / Self Care | Payer: Medicare Other | Source: Ambulatory Visit | Attending: Family Medicine | Admitting: Family Medicine

## 2018-10-31 ENCOUNTER — Other Ambulatory Visit: Payer: Self-pay

## 2018-10-31 DIAGNOSIS — Z9289 Personal history of other medical treatment: Secondary | ICD-10-CM

## 2018-10-31 DIAGNOSIS — Z1231 Encounter for screening mammogram for malignant neoplasm of breast: Secondary | ICD-10-CM | POA: Diagnosis not present

## 2018-11-26 ENCOUNTER — Encounter: Payer: Self-pay | Admitting: Family Medicine

## 2018-12-12 ENCOUNTER — Other Ambulatory Visit: Payer: Self-pay | Admitting: Family Medicine

## 2018-12-12 NOTE — Telephone Encounter (Signed)
Requested medication (s) are due for refill today: yes  Requested medication (s) are on the active medication list: yes  Last refill:  10/14/18  Future visit scheduled: yes  Notes to clinic:  Last conversation Dr Pamella Pert requested pt take tylenol    Requested Prescriptions  Pending Prescriptions Disp Refills   meloxicam (MOBIC) 7.5 MG tablet [Pharmacy Med Name: Meloxicam 7.5 MG Oral Tablet] 30 tablet 0    Sig: Take 1 tablet by mouth once daily     Analgesics:  COX2 Inhibitors Failed - 12/12/2018  5:02 PM      Failed - HGB in normal range and within 360 days    Hemoglobin  Date Value Ref Range Status  09/06/2018 16.2 (H) 11.1 - 15.9 g/dL Final   HGB  Date Value Ref Range Status  06/01/2015 16.4 (H) 11.6 - 15.9 g/dL Final         Failed - Cr in normal range and within 360 days    Creatinine  Date Value Ref Range Status  06/01/2015 1.1 0.6 - 1.1 mg/dL Final   Creat  Date Value Ref Range Status  11/18/2015 1.00 (H) 0.50 - 0.99 mg/dL Final    Comment:      For patients > or = 71 years of age: The upper reference limit for Creatinine is approximately 13% higher for people identified as African-American.      Creatinine, Ser  Date Value Ref Range Status  09/06/2018 1.07 (H) 0.57 - 1.00 mg/dL Final         Passed - Patient is not pregnant      Passed - Valid encounter within last 12 months    Recent Outpatient Visits          3 months ago Encounter for [employer] annual wellness exam   [facility] at [facility], Lilia Argue, MD   9 months ago Hypercalcemia   [facility], Lilia Argue, MD   1 year ago Encounter for Medicare annual wellness exam   Primary Care at [facility], Renette Butters, MD   2 years ago Hypercalcemia   Primary Care at [facility], Ines Bloomer, MD   2 years ago Essential hypertension   Primary Care at [facility], Renette Butters, MD      Future Appointments            In 2 months Rutherford Guys, MD Primary Care at [place], [facility]   In 3 months Croitoru, Dani Gobble, MD [facility] [place], [facility]

## 2019-01-11 ENCOUNTER — Encounter: Payer: Self-pay | Admitting: Family Medicine

## 2019-01-15 NOTE — Telephone Encounter (Signed)
Can you please get me  the form, thanks

## 2019-01-21 ENCOUNTER — Telehealth: Payer: Self-pay | Admitting: Family Medicine

## 2019-01-21 NOTE — Telephone Encounter (Signed)
Please advise  Copied from [place] [phone]. Topic: General - Call Back - No Documentation >> Jan 18, 2019  1:26 PM Erick Blinks wrote: Reason for CRM: Pt called to inquire about missed call today in regards to Handicap Tags for vehicle.  Best contact: [phone]

## 2019-01-22 NOTE — Telephone Encounter (Signed)
Mailed 01/21/2019

## 2019-01-23 ENCOUNTER — Encounter: Payer: Self-pay | Admitting: *Deleted

## 2019-01-23 NOTE — Telephone Encounter (Signed)
mychart message to patient  Formed mailed

## 2019-03-07 ENCOUNTER — Telehealth (INDEPENDENT_AMBULATORY_CARE_PROVIDER_SITE_OTHER): Payer: Medicare Other | Admitting: Family Medicine

## 2019-03-07 ENCOUNTER — Other Ambulatory Visit: Payer: Self-pay

## 2019-03-07 DIAGNOSIS — I10 Essential (primary) hypertension: Secondary | ICD-10-CM | POA: Diagnosis not present

## 2019-03-07 DIAGNOSIS — M545 Low back pain, unspecified: Secondary | ICD-10-CM

## 2019-03-07 DIAGNOSIS — E559 Vitamin D deficiency, unspecified: Secondary | ICD-10-CM | POA: Diagnosis not present

## 2019-03-07 DIAGNOSIS — N3941 Urge incontinence: Secondary | ICD-10-CM | POA: Diagnosis not present

## 2019-03-07 DIAGNOSIS — G8929 Other chronic pain: Secondary | ICD-10-CM

## 2019-03-07 DIAGNOSIS — E7800 Pure hypercholesterolemia, unspecified: Secondary | ICD-10-CM

## 2019-03-07 DIAGNOSIS — D751 Secondary polycythemia: Secondary | ICD-10-CM | POA: Diagnosis not present

## 2019-03-07 MED ORDER — OXYBUTYNIN CHLORIDE 5 MG PO TABS: 5.0000 mg | ORAL_TABLET | Freq: Two times a day (BID) | ORAL | 1 refills | Status: DC

## 2019-03-07 MED ORDER — DICLOFENAC SODIUM 1 % EX GEL: 4.0000 g | Freq: Four times a day (QID) | CUTANEOUS | 2 refills | Status: DC

## 2019-03-07 MED ORDER — MELOXICAM 7.5 MG PO TABS: 7.5000 mg | ORAL_TABLET | Freq: Every day | ORAL | 2 refills | Status: DC

## 2019-03-07 MED ORDER — VALSARTAN 80 MG PO TABS: 80.0000 mg | ORAL_TABLET | Freq: Every day | ORAL | 3 refills | Status: DC

## 2019-03-07 NOTE — Progress Notes (Signed)
Virtual Visit Note  I connected with patient on 03/07/19 at 809am by phone and verified that I am speaking with the correct person using two identifiers. Sierra Carpenter is currently located at home and patient is currently with them during visit. The provider, Rutherford Guys, MD is located in their office at time of visit.  I discussed the limitations, risks, security and privacy concerns of performing an evaluation and management service by telephone and the availability of in person appointments. I also discussed with the patient that there may be a patient responsible charge related to this service. The patient expressed understanding and agreed to proceed.   CC: routine followup  HPI  Last OV Sierra Carpenter 2020 Sees cards next week Has eye exam in mid dec as well?  Doing well, has no acute concerns today Takes BP meds as prescribed Checks BP at home daily  Takes meloxicam 1/2 qod for her lower back would like rx for diclofenac gel as well so that she can continue to use meloxicam prn given concerns for side effects  Urge incontinence is well controlled with oxybutynin  Takes vit D 1000 units a day Had normal dexa in 2017   Allergies  Allergen Reactions  . Hydrochlorothiazide     Hypercalcemia     Prior to Admission medications   Medication Sig Start Date End Date Taking? Authorizing Provider  Ascorbic Acid (VITAMIN C PO) Take by mouth daily.    [provider]  aspirin 81 MG tablet Take 1 tablet (81 mg total) by mouth daily. 11/29/16   Wardell Honour, MD  atorvastatin (LIPITOR) 40 MG tablet Take 1 tablet (40 mg total) by mouth daily. 07/12/18   Croitoru, Mihai, MD  co-enzyme Q-10 30 MG capsule Take 100 mg by mouth 2 (two) times daily.    [provider]  doxycycline (VIBRA-TABS) 100 MG tablet Take 1 tablet (100 mg total) by mouth 2 (two) times daily. 09/06/18   Rutherford Guys, MD  fish oil-omega-3 fatty acids 1000 MG capsule Take 1 capsule by mouth 3 (three)  times daily.    [provider]  meloxicam (MOBIC) 7.5 MG tablet Take 1 tablet by mouth once daily 12/13/18   Rutherford Guys, MD  Multiple Vitamins-Minerals (RA VISION-VITE PRESERVE PO) Take by mouth.    [provider]  oxybutynin (DITROPAN) 5 MG tablet Take 1 tablet by mouth twice daily 09/17/18   Rutherford Guys, MD  valsartan (DIOVAN) 80 MG tablet Take 1 tablet (80 mg total) by mouth daily. 07/03/18   Croitoru, Dani Gobble, MD  White Petrolatum-Mineral Oil (SYSTANE NIGHTTIME OP) Apply to eye.    [provider]    Past Medical History:  Diagnosis Date  . Aortic valve sclerosis   . Coronary atherosclerosis    without significant obstruction  . Heart murmur   . History of nuclear stress test 07/12/2010   bruce protocol myoview; normal pattern of perfusion, low risk, post-stress EF 92%  . Hyperlipidemia   . Hypertension     Past Surgical History:  Procedure Laterality Date  . ABDOMINAL HYSTERECTOMY     fibroids; ovaries intact.  . APPENDECTOMY    . CARDIAC CATHETERIZATION     moderate stenosis in 1st diagonal & RCA  . CESAREAN SECTION    . CHOLECYSTECTOMY    . EYE SURGERY    . TRANSTHORACIC ECHOCARDIOGRAM  07/12/2010   EF=>55% with normal systolic function; trace MR/TR/PR    Social History   Tobacco  Use  . Smoking status: Never Smoker  . Smokeless tobacco: Never Used  Substance Use Topics  . Alcohol use: No    Family History  Problem Relation Age of Onset  . Heart disease Father 29       AMI  . Heart disease Brother   . Heart disease Son   . Heart disease Paternal Grandfather   . Alzheimer's disease Mother   . Heart disease Brother        CHF/CAD  . COPD Brother   . Heart disease Brother   . Breast cancer Neg Hx     Review of Systems  Constitutional: Negative for chills and fever.  Respiratory: Negative for cough and shortness of breath.   Cardiovascular: Negative for chest pain, palpitations and leg swelling.  Gastrointestinal: Negative  for abdominal pain, nausea and vomiting.    Objective  Vitals as reported by the patient: per nurses notes  Gen: AAOx3, NAD Speaking in full sentences, breathing comfortably   ASSESSMENT and PLAN  1. Essential hypertension Controlled. Continue current regime.  - Comprehensive metabolic panel; Future  2. Hypercholesterolemia Checking labs today, medications will be adjusted as needed.  - Lipid panel; Future  3. Urge incontinence Controlled. Continue current regime.   4. Vitamin D deficiency Checking labs today, medications will be adjusted as needed.  - VITAMIN D 25 Hydroxy (Vit-D Deficiency, Fractures); Future  5. Chronic bilateral low back pain without sciatica Stable. Cont with current regime.  6. Polycythemia Evaluated by heme, secondary, routine surveillance with CBC - CBC; Future  Other orders - meloxicam (MOBIC) 7.5 MG tablet; Take 1 tablet (7.5 mg total) by mouth daily. - oxybutynin (DITROPAN) 5 MG tablet; Take 1 tablet (5 mg total) by mouth 2 (two) times daily. - valsartan (DIOVAN) 80 MG tablet; Take 1 tablet (80 mg total) by mouth daily. - diclofenac Sodium (VOLTAREN) 1 % GEL; Apply 4 g topically 4 (four) times daily.  FOLLOW-UP: 6 months, fasting labs in 1-2 weeks from today   The above assessment and management plan was discussed with the patient. The patient verbalized understanding of and has agreed to the management plan. Patient is aware to call the clinic if symptoms persist or worsen. Patient is aware when to return to the clinic for a follow-up visit. Patient educated on when it is appropriate to go to the emergency department.    I provided 13 minutes of non-face-to-face time during this encounter.  Rutherford Guys, MD [facility] at [facility] [place], [place] [address] Ph.  [phone] Fax [phone]

## 2019-03-07 NOTE — Progress Notes (Signed)
Follow up on chronic conditions, says she is not having any medical concerns right now. She will be going to card appt on next week. Vitals this morning W-180lb, BP-124/63, P-88, O2-97, T-97.0. Needing refills on pended medication

## 2019-03-08 ENCOUNTER — Ambulatory Visit (INDEPENDENT_AMBULATORY_CARE_PROVIDER_SITE_OTHER): Payer: Medicare Other | Admitting: Family Medicine

## 2019-03-08 ENCOUNTER — Other Ambulatory Visit: Payer: Self-pay

## 2019-03-08 DIAGNOSIS — I10 Essential (primary) hypertension: Secondary | ICD-10-CM | POA: Diagnosis not present

## 2019-03-08 DIAGNOSIS — E559 Vitamin D deficiency, unspecified: Secondary | ICD-10-CM | POA: Diagnosis not present

## 2019-03-08 DIAGNOSIS — D751 Secondary polycythemia: Secondary | ICD-10-CM | POA: Diagnosis not present

## 2019-03-08 DIAGNOSIS — E7800 Pure hypercholesterolemia, unspecified: Secondary | ICD-10-CM

## 2019-03-09 LAB — LIPID PANEL
Chol/HDL Ratio: 4.7 ratio — ABNORMAL HIGH (ref 0.0–4.4)
Cholesterol, Total: 163 mg/dL (ref 100–199)
HDL: 35 mg/dL — ABNORMAL LOW (ref >39)
LDL Chol Calc (NIH): 99 mg/dL (ref 0–99)
Triglycerides: 167 mg/dL — ABNORMAL HIGH (ref 0–149)
VLDL Cholesterol Cal: 29 mg/dL (ref 5–40)

## 2019-03-09 LAB — CBC
Hematocrit: 48.8 % — ABNORMAL HIGH (ref 34.0–46.6)
Hemoglobin: 16.7 g/dL — ABNORMAL HIGH (ref 11.1–15.9)
MCH: 30.5 pg (ref 26.6–33.0)
MCHC: 34.2 g/dL (ref 31.5–35.7)
MCV: 89 fL (ref 79–97)
Platelets: 369 10*3/uL (ref 150–450)
RBC: 5.48 x10E6/uL — ABNORMAL HIGH (ref 3.77–5.28)
RDW: 12.8 % (ref 11.7–15.4)
WBC: 9.7 10*3/uL (ref 3.4–10.8)

## 2019-03-09 LAB — COMPREHENSIVE METABOLIC PANEL
ALT: 12 IU/L (ref 0–32)
AST: 12 IU/L (ref 0–40)
Albumin/Globulin Ratio: 2.1 (ref 1.2–2.2)
Albumin: 4.8 g/dL (ref 3.8–4.8)
Alkaline Phosphatase: 175 IU/L — ABNORMAL HIGH (ref 39–117)
BUN/Creatinine Ratio: 16 (ref 12–28)
BUN: 16 mg/dL (ref 8–27)
Bilirubin Total: 0.5 mg/dL (ref 0.0–1.2)
CO2: 25 mmol/L (ref 20–29)
Calcium: 10.3 mg/dL (ref 8.7–10.3)
Chloride: 101 mmol/L (ref 96–106)
Creatinine, Ser: 0.98 mg/dL (ref 0.57–1.00)
GFR calc Af Amer: 68 mL/min/{1.73_m2} (ref >59)
GFR calc non Af Amer: 59 mL/min/{1.73_m2} — ABNORMAL LOW (ref >59)
Globulin, Total: 2.3 g/dL (ref 1.5–4.5)
Glucose: 97 mg/dL (ref 65–99)
Potassium: 4.3 mmol/L (ref 3.5–5.2)
Sodium: 142 mmol/L (ref 134–144)
Total Protein: 7.1 g/dL (ref 6.0–8.5)

## 2019-03-09 LAB — VITAMIN D 25 HYDROXY (VIT D DEFICIENCY, FRACTURES): Vit D, 25-Hydroxy: 18.4 ng/mL — ABNORMAL LOW (ref 30.0–100.0)

## 2019-03-12 ENCOUNTER — Other Ambulatory Visit: Payer: Self-pay

## 2019-03-12 ENCOUNTER — Ambulatory Visit (INDEPENDENT_AMBULATORY_CARE_PROVIDER_SITE_OTHER): Payer: Medicare Other | Admitting: Cardiovascular Disease

## 2019-03-12 ENCOUNTER — Encounter: Payer: Self-pay | Admitting: Cardiovascular Disease

## 2019-03-12 VITALS — BP 181/81 | HR 70 | Temp 96.4°F | Ht 62.0 in | Wt 181.0 lb

## 2019-03-12 DIAGNOSIS — I350 Nonrheumatic aortic (valve) stenosis: Secondary | ICD-10-CM | POA: Diagnosis not present

## 2019-03-12 DIAGNOSIS — I2510 Atherosclerotic heart disease of native coronary artery without angina pectoris: Secondary | ICD-10-CM | POA: Diagnosis not present

## 2019-03-12 DIAGNOSIS — I10 Essential (primary) hypertension: Secondary | ICD-10-CM

## 2019-03-12 DIAGNOSIS — E7800 Pure hypercholesterolemia, unspecified: Secondary | ICD-10-CM

## 2019-03-12 DIAGNOSIS — E669 Obesity, unspecified: Secondary | ICD-10-CM | POA: Diagnosis not present

## 2019-03-12 NOTE — Progress Notes (Signed)
Cardiology Office Note    Date:  03/17/2019   ID:  Izzy Makiaya, [place] [birth year], MRN [id number]  PCP:  Rutherford Guys, MD  Cardiologist:   Sanda Klein, MD   Chief complaint: AS    History of Present Illness:  Sierra Carpenter is a 73 y.o. female with degenerative aortic stenosis, coronary artery disease, hypertension and hyperlipidemia.  Generally doing well.  Had a virtual appointment with Dr. Pamella Pert last week.  The patient specifically denies any chest pain at rest exertion, dyspnea at rest or with exertion, orthopnea, paroxysmal nocturnal dyspnea, syncope, palpitations, focal neurological deficits, intermittent claudication, lower extremity edema, unexplained weight gain, cough, hemoptysis or wheezing.  Blood pressure was high today, but she reports that at home it is consistently 128-138/65 (she keeps a detailed log).  In 2000 she had coronary angiography that showed minor coronary artery disease (50% ostial first diagonal artery, 30% proximal RCA).  She had a normal stress test in November 2018.  Also November 2017 her echo showed moderate aortic stenosis with an estimated valve area of around 1.3 cm and a mean gradient of 28 mmHg.  She has normal left ventricular systolic function.  Past Medical History:  Diagnosis Date  . Aortic valve sclerosis   . Coronary atherosclerosis    without significant obstruction  . Heart murmur   . History of nuclear stress test 07/12/2010   bruce protocol myoview; normal pattern of perfusion, low risk, post-stress EF 92%  . Hyperlipidemia   . Hypertension     Past Surgical History:  Procedure Laterality Date  . ABDOMINAL HYSTERECTOMY     fibroids; ovaries intact.  . APPENDECTOMY    . CARDIAC CATHETERIZATION     moderate stenosis in 1st diagonal & RCA  . CESAREAN SECTION    . CHOLECYSTECTOMY    . EYE SURGERY    . TRANSTHORACIC ECHOCARDIOGRAM  07/12/2010   EF=>55% with normal systolic function; trace MR/TR/PR    Current  Medications: Outpatient Medications Prior to Visit  Medication Sig Dispense Refill  . Ascorbic Acid (VITAMIN C PO) Take by mouth daily.    Marland Kitchen aspirin 81 MG tablet Take 1 tablet (81 mg total) by mouth daily. 1 tablet   . atorvastatin (LIPITOR) 40 MG tablet Take 1 tablet (40 mg total) by mouth daily. 90 tablet 3  . Cholecalciferol (VITAMIN D3 PO) Take by mouth.    . co-enzyme Q-10 30 MG capsule Take 100 mg by mouth 2 (two) times daily.    . diclofenac Sodium (VOLTAREN) 1 % GEL Apply 4 g topically 4 (four) times daily. 100 g 2  . fish oil-omega-3 fatty acids 1000 MG capsule Take 1 capsule by mouth 3 (three) times daily.    . meloxicam (MOBIC) 7.5 MG tablet Take 1 tablet (7.5 mg total) by mouth daily. 30 tablet 2  . Multiple Vitamins-Minerals (RA VISION-VITE PRESERVE PO) Take by mouth.    . valsartan (DIOVAN) 80 MG tablet Take 1 tablet (80 mg total) by mouth daily. 90 tablet 3  . White Petrolatum-Mineral Oil (SYSTANE NIGHTTIME OP) Apply to eye.    Marland Kitchen oxybutynin (DITROPAN) 5 MG tablet Take 1 tablet (5 mg total) by mouth 2 (two) times daily. 180 tablet 1   No facility-administered medications prior to visit.     Allergies:   Hydrochlorothiazide   Social History   Socioeconomic History  . Marital status: Widowed    Spouse name: Not on file  . Number of children: 1  .  Years of education: 49  . Highest education level: Not on file  Occupational History  . Occupation: retired    Comment: Production designer, theatre/television/film  Tobacco Use  . Smoking status: Never Smoker  . Smokeless tobacco: Never Used  Substance and Sexual Activity  . Alcohol use: No  . Drug use: No  . Sexual activity: Yes    Birth control/protection: Post-menopausal, Surgical  Other Topics Concern  . Not on file  Social History Narrative   Marital status: widowed since 2014 due to COPD; married x 37 years.      Children: 1 daughter; 1 son died of CHF age 25 pacemaker.  4 grandchildren.      Lives: alone      Employment: retired at  age 68 from [employer].      Tobacco:  never       Alcohol:  never      Exercise: yes in 2019; walking 1-2 miles daily.  DDD lumbar limiting walking.      ADLs: independent with ADLs; drives.        Advanced Directives: none; has paper.  FULL CODE; no prolonged measures.  HCPOA: daughter/Misty Wenn-Man[place].   Social Determinants of Health   Financial Resource Strain:   . Difficulty of Paying Living Expenses: Not on file  Food Insecurity:   . Worried About Charity fundraiser in the Last Year: Not on file  . Ran Out of Food in the Last Year: Not on file  Transportation Needs:   . Lack of Transportation (Medical): Not on file  . Lack of Transportation (Non-Medical): Not on file  Physical Activity:   . Days of Exercise per Week: Not on file  . Minutes of Exercise per Session: Not on file  Stress:   . Feeling of Stress : Not on file  Social Connections:   . Frequency of Communication with Friends and Family: Not on file  . Frequency of Social Gatherings with Friends and Family: Not on file  . Attends Religious Services: Not on file  . Active Member of Clubs or Organizations: Not on file  . Attends Archivist Meetings: Not on file  . Marital Status: Not on file     Family History:  The patient's family history includes Alzheimer's disease in her mother; COPD in her brother; Heart disease in her brother, brother, brother, paternal grandfather, and son; Heart disease (age of onset: 92) in her father.   ROS:   Please see the history of present illness.    ROS all other systems are reviewed and are negative   PHYSICAL EXAM:   VS:  BP (!) 181/81   Pulse 70   Temp (!) 96.4 F (35.8 C)   Ht 5\' 2"  (1.575 m)   Wt 181 lb (82.1 kg)   SpO2 96%   BMI 33.11 kg/m    Recheck BP 149/67 mmHg  General: Alert, oriented x3, no distress, mildly obese Head: no evidence of trauma, PERRL, EOMI, no exophtalmos or lid lag, no myxedema, no xanthelasma; normal ears, nose and  oropharynx Neck: normal jugular venous pulsations and no hepatojugular reflux; brisk carotid pulses without delay and no carotid bruits Chest: clear to auscultation, no signs of consolidation by percussion or palpation, normal fremitus, symmetrical and full respiratory excursions Cardiovascular: normal position and quality of the apical impulse, regular rhythm, normal first and second heart sounds, 3/6 mid peaking systolic ejection murmur in the aortic focus, no diastolic murmurs, rubs or gallops Abdomen: no  tenderness or distention, no masses by palpation, no abnormal pulsatility or arterial bruits, normal bowel sounds, no hepatosplenomegaly Extremities: no clubbing, cyanosis or edema; 2+ radial, ulnar and brachial pulses bilaterally; 2+ right femoral, posterior tibial and dorsalis pedis pulses; 2+ left femoral, posterior tibial and dorsalis pedis pulses; no subclavian or femoral bruits Neurological: grossly nonfocal Psych: Normal mood and affect   Wt Readings from Last 3 Encounters:  03/12/19 181 lb (82.1 kg)  05/15/18 176 lb (79.8 kg)  04/05/18 173 lb 1.6 oz (78.5 kg)      Studies/Labs Reviewed:   EKG:  EKG is ordered today.  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  Very similar to previous tracing.    Recent Labs: 09/06/2018: TSH 2.270 03/08/2019: ALT 12; BUN 16; Creatinine, Ser 0.98; Hemoglobin 16.7; Platelets 369; Potassium 4.3; Sodium 142   Lipid Panel    Component Value Date/Time   CHOL 163 03/08/2019 1112   TRIG 167 (H) 03/08/2019 1112   HDL 35 (L) 03/08/2019 1112   CHOLHDL 4.7 (H) 03/08/2019 1112   CHOLHDL 3.2 11/18/2015 1111   VLDL 29 11/18/2015 1111   LDLCALC 99 03/08/2019 1112      ASSESSMENT:    1. Non-rheumatic aortic stenosis   2. Atherosclerosis of native coronary artery of native  heart without angina pectoris   3. Essential hypertension   4. Hypercholesterolemia   5. Mild obesity      PLAN:  In order of problems listed above:  1. CAD: Asymptomatic.  Previous nuclear perfusion imaging showed normal findings.  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 2. AS: Her murmur suggests progression of the aortic stenosis, but she has no complaints to suggest severe AS.Marland Kitchen  Recheck her echo before her next appointment, when hopefully the coronavirus pandemic will be less serious.  Asymptomatic, but I reminded her to promptly report exertional angina, exertional dyspnea or exertional syncope.  Again reviewed the options for aortic valve replacement: either surgical with a biological prosthesis or (more likely) TAVR, when she becomes symptomatic. 3. HTN: Poorly much better controlled when checked with her home monitor. 4. HLP: LDL is not at target (less than 70).  I wonder whether this is due to less compliance with the medication, since her LDL was better last year. 5. Obesity: Encouraged her again to try to lose some weight..   Medication Adjustments/Labs and Tests Ordered: Current medicines are reviewed at length with the patient today.  Concerns regarding medicines are outlined above.  Medication changes, Labs and Tests ordered today are listed in the Patient Instructions below. Patient Instructions  Medication Instructions:  No changes *If you need a refill on your cardiac medications before your next appointment, please call your pharmacy*  Lab Work: None ordered If you have labs (blood work) drawn today and your tests are completely normal, you will receive your results only by: Marland Kitchen MyChart Message (if you have MyChart) OR . A paper copy in the mail If you have any lab test that is abnormal or we need to change your treatment, we will  call you to review the results.  Testing/Procedures: Your physician has requested that you have an echocardiogram in 12 months. Echocardiography is a painless test that uses sound waves to  create images of your heart. It provides your doctor with information about the size and shape of your heart and how well your heart's chambers and valves are working. You may receive an ultrasound enhancing agent through an IV if needed to better visualize your heart during the echo.This procedure takes approximately one hour. There are no restrictions for this procedure. This will take place at the [address].    Follow-Up: At [facility], you and your health needs are our priority.  As part of our continuing mission to provide you with exceptional heart care, we have created designated Provider Care Teams.  These Care Teams include your primary Cardiologist (physician) and Advanced Practice Providers (APPs -  Physician Assistants and Nurse Practitioners) who all work together to provide you with the care you need, when you need it.  Your next appointment:   12 month(s)  The format for your next appointment:   Either In Person or Virtual  Provider:   You may see Sanda Klein, MD or one of the following Advanced Practice Providers on your designated Care Team:    Almyra Deforest, PA-C  Fabian Sharp, [place] or   Roby Lofts, PA-C     Signed, Sanda Klein, MD  03/17/2019 10:42 AM    [facility] [place], [place], [place]  [address] Phone: [phone]; Fax: [phone]

## 2019-03-12 NOTE — Addendum Note (Signed)
Addended by: Rutherford Guys on: 03/12/2019 02:47 PM   Modules accepted: Orders

## 2019-03-12 NOTE — Patient Instructions (Signed)
Medication Instructions:  No changes *If you need a refill on your cardiac medications before your next appointment, please call your pharmacy*  Lab Work: None ordered If you have labs (blood work) drawn today and your tests are completely normal, you will receive your results only by: Marland Kitchen MyChart Message (if you have MyChart) OR . A paper copy in the mail If you have any lab test that is abnormal or we need to change your treatment, we will call you to review the results.  Testing/Procedures: Your physician has requested that you have an echocardiogram in 12 months. Echocardiography is a painless test that uses sound waves to create images of your heart. It provides your doctor with information about the size and shape of your heart and how well your heart's chambers and valves are working. You may receive an ultrasound enhancing agent through an IV if needed to better visualize your heart during the echo.This procedure takes approximately one hour. There are no restrictions for this procedure. This will take place at the [address].    Follow-Up: At [facility], you and your health needs are our priority.  As part of our continuing mission to provide you with exceptional heart care, we have created designated Provider Care Teams.  These Care Teams include your primary Cardiologist (physician) and Advanced Practice Providers (APPs -  Physician Assistants and Nurse Practitioners) who all work together to provide you with the care you need, when you need it.  Your next appointment:   12 month(s)  The format for your next appointment:   Either In Person or Virtual  Provider:   You may see Sanda Klein, MD or one of the following Advanced Practice Providers on your designated Care Team:    Almyra Deforest, PA-C  Fabian Sharp, PA-C or   Roby Lofts, [place]

## 2019-03-17 ENCOUNTER — Encounter: Payer: Self-pay | Admitting: Cardiovascular Disease

## 2019-05-07 DIAGNOSIS — Z961 Presence of intraocular lens: Secondary | ICD-10-CM | POA: Diagnosis not present

## 2019-05-07 DIAGNOSIS — H04123 Dry eye syndrome of bilateral lacrimal glands: Secondary | ICD-10-CM | POA: Diagnosis not present

## 2019-05-07 DIAGNOSIS — H26492 Other secondary cataract, left eye: Secondary | ICD-10-CM | POA: Diagnosis not present

## 2019-05-07 DIAGNOSIS — H53022 Refractive amblyopia, left eye: Secondary | ICD-10-CM | POA: Diagnosis not present

## 2019-05-30 ENCOUNTER — Ambulatory Visit: Payer: Medicare Other | Attending: Internal Medicine

## 2019-05-30 DIAGNOSIS — Z23 Encounter for immunization: Secondary | ICD-10-CM

## 2019-05-30 NOTE — Progress Notes (Signed)
   Covid-19 Vaccination Clinic  Name:  Sierra Carpenter    MRN: [id number] DOB: [birth year]  05/30/2019  Sierra Carpenter was observed post Covid-19 immunization for 15 minutes without incidence. She was provided with Vaccine Information Sheet and instruction to access the V-Safe system.   Sierra Carpenter was instructed to call 911 with any severe reactions post vaccine: Marland Kitchen Difficulty breathing  . Swelling of your face and throat  . A fast heartbeat  . A bad rash all over your body  . Dizziness and weakness    Immunizations Administered    Name Date Dose VIS Date Route   Pfizer COVID-19 Vaccine 05/30/2019  9:24 AM 0.3 mL 03/15/2019 Intramuscular   Manufacturer: [place]   Lot: [id number]   [place]: [id number]

## 2019-06-13 ENCOUNTER — Encounter: Payer: Self-pay | Admitting: Family Medicine

## 2019-06-13 DIAGNOSIS — L821 Other seborrheic keratosis: Secondary | ICD-10-CM | POA: Diagnosis not present

## 2019-06-13 DIAGNOSIS — L853 Xerosis cutis: Secondary | ICD-10-CM | POA: Diagnosis not present

## 2019-06-13 DIAGNOSIS — L814 Other melanin hyperpigmentation: Secondary | ICD-10-CM | POA: Diagnosis not present

## 2019-06-13 DIAGNOSIS — D225 Melanocytic nevi of trunk: Secondary | ICD-10-CM | POA: Diagnosis not present

## 2019-06-13 DIAGNOSIS — D1801 Hemangioma of skin and subcutaneous tissue: Secondary | ICD-10-CM | POA: Diagnosis not present

## 2019-06-13 DIAGNOSIS — D485 Neoplasm of uncertain behavior of skin: Secondary | ICD-10-CM | POA: Diagnosis not present

## 2019-06-17 ENCOUNTER — Other Ambulatory Visit: Payer: Self-pay | Admitting: Cardiovascular Disease

## 2019-06-25 ENCOUNTER — Ambulatory Visit: Payer: Medicare Other | Attending: Internal Medicine

## 2019-06-25 DIAGNOSIS — Z23 Encounter for immunization: Secondary | ICD-10-CM

## 2019-06-25 NOTE — Progress Notes (Signed)
   Covid-19 Vaccination Clinic  Name:  Sierra Carpenter    MRN: [id number] DOB: Sep 24, 1945  06/25/2019  [name] was observed post Covid-19 immunization for 15 minutes without incident. She was provided with Vaccine Information Sheet and instruction to access the V-Safe system.   [name] was instructed to call 911 with any severe reactions post vaccine: Marland Kitchen Difficulty breathing  . Swelling of face and throat  . A fast heartbeat  . A bad rash all over body  . Dizziness and weakness   Immunizations Administered    Name Date Dose VIS Date Route   Pfizer COVID-19 Vaccine 06/25/2019  9:12 AM 0.3 mL 03/15/2019 Intramuscular   Manufacturer: [place]   Lot: [id number]   [place]: [id number]

## 2019-07-29 DIAGNOSIS — L905 Scar conditions and fibrosis of skin: Secondary | ICD-10-CM | POA: Diagnosis not present

## 2019-07-29 DIAGNOSIS — I8312 Varicose veins of left lower extremity with inflammation: Secondary | ICD-10-CM | POA: Diagnosis not present

## 2019-07-29 DIAGNOSIS — L816 Other disorders of diminished melanin formation: Secondary | ICD-10-CM | POA: Diagnosis not present

## 2019-08-29 ENCOUNTER — Other Ambulatory Visit: Payer: Self-pay | Admitting: Family Medicine

## 2019-09-05 ENCOUNTER — Encounter: Payer: Self-pay | Admitting: Family Medicine

## 2019-09-09 ENCOUNTER — Ambulatory Visit (INDEPENDENT_AMBULATORY_CARE_PROVIDER_SITE_OTHER): Payer: Medicare Other | Admitting: Family Medicine

## 2019-09-09 VITALS — BP 115/60 | HR 89 | Temp 96.9°F | Ht 61.34 in | Wt 181.0 lb

## 2019-09-09 DIAGNOSIS — Z0000 Encounter for general adult medical examination without abnormal findings: Secondary | ICD-10-CM

## 2019-09-09 NOTE — Progress Notes (Signed)
Presents today for [employer] Visit   Date of last exam: 6/3/20201   Interpreter used for this visit?  No  I connected with  Sierra Carpenter on 09/09/19 by a telephone  and verified that I am speaking with the correct person using two identifiers.   I discussed the limitations of evaluation and management by telemedicine. The patient expressed understanding and agreed to proceed.    Patient Care Team: Rutherford Guys, MD as PCP - General (Family Medicine) Sanda Klein, MD as Consulting Physician (Cardiology) Katy Fitch, Darlina Guys, MD as Consulting Physician (Ophthalmology)   Other items to address today:   Discussed Eye/Dental Discussed Immunizations    Other Screening: Last screening for diabetes: 03/08/2019 Last lipid screening:03/08/2019  ADVANCE DIRECTIVES: Discussed: yes On File: no Materials Provided: no  Immunization status:  Immunization History  Administered Date(s) Administered   Influenza Split 12/08/2012   Influenza, High Dose Seasonal PF 11/23/2018   Influenza, Quadrivalent, Recombinant, Inj, Pf 11/22/2017   Influenza,inj,Quad PF,6+ Mos 12/18/2014, 11/29/2016   Influenza-Unspecified 12/10/2013, 11/30/2015, 06/01/2016, 11/22/2017, 11/23/2018   PFIZER SARS-COV-2 Vaccination 05/30/2019, 06/25/2019   Pneumococcal Conjugate-13 12/10/2013   Pneumococcal Polysaccharide-23 12/18/2014   Tdap 08/07/2010     There are no preventive care reminders to display for this patient.   Functional Status Survey: Is the patient deaf or have difficulty hearing?: No Does the patient have difficulty seeing, even when wearing glasses/contacts?: No Does the patient have difficulty concentrating, remembering, or making decisions?: No Does the patient have difficulty walking or climbing stairs?: No Does the patient have difficulty dressing or bathing?: No Does the patient have difficulty doing errands alone such as visiting a doctor's office or  shopping?: No   6CIT Screen 09/09/2019 09/06/2018  What Year? 0 points 0 points  What month? 0 points 0 points  What time? 0 points 0 points  Count back from 20 0 points 0 points  Months in reverse 0 points 0 points  Repeat phrase 0 points 0 points  Total Score 0 0         Home Environment:   Lives in one story home No trouble climbing stairs No scattered rugs Yes grab bars Adequate lighting   Patient Active Problem List   Diagnosis Date Noted   Vitamin D deficiency 09/06/2018   Urge incontinence 08/12/2017   Class 1 obesity due to excess calories with serious comorbidity and body mass index (BMI) of 31.0 to 31.9 in adult 06/13/2016   Paresthesia of lower lip 06/06/2016   Non-rheumatic aortic stenosis 08/28/2015   Polycythemia 06/01/2015   Coronary atherosclerosis 01/01/2013   Essential hypertension 05/09/2011   Hypercholesterolemia 05/09/2011   Colon polyps 05/09/2011     Past Medical History:  Diagnosis Date   Aortic valve sclerosis    Coronary atherosclerosis    without significant obstruction   Heart murmur    History of nuclear stress test 07/12/2010   bruce protocol myoview; normal pattern of perfusion, low risk, post-stress EF 92%   Hyperlipidemia    Hypertension      Past Surgical History:  Procedure Laterality Date   ABDOMINAL HYSTERECTOMY     fibroids; ovaries intact.   APPENDECTOMY     CARDIAC CATHETERIZATION     moderate stenosis in 1st diagonal & RCA   CESAREAN SECTION     CHOLECYSTECTOMY     EYE SURGERY     TRANSTHORACIC ECHOCARDIOGRAM  07/12/2010   EF=>55% with normal systolic function; trace MR/TR/PR  Family History  Problem Relation Age of Onset   Heart disease Father 49       AMI   Heart disease Brother    Heart disease Son    Heart disease Paternal Grandfather    Alzheimer's disease Mother    Heart disease Brother        CHF/CAD   COPD Brother    Heart disease Brother    Breast cancer Neg Hx       Social History   Socioeconomic History   Marital status: Widowed    Spouse name: Not on file   Number of children: 1   Years of education: 10   Highest education level: Not on file  Occupational History   Occupation: retired    Comment: Production designer, theatre/television/film  Tobacco Use   Smoking status: Never Smoker   Smokeless tobacco: Never Used  Substance and Sexual Activity   Alcohol use: No   Drug use: No   Sexual activity: Yes    Birth control/protection: Post-menopausal, Surgical  Other Topics Concern   Not on file  Social History Narrative   Marital status: widowed since 2014 due to COPD; married x 37 years.      Children: 1 daughter; 1 son died of CHF age 17 pacemaker.  4 grandchildren.      Lives: alone      Employment: retired at age 62 from [employer].      Tobacco:  never       Alcohol:  never      Exercise: yes in 2019; walking 1-2 miles daily.  DDD lumbar limiting walking.      ADLs: independent with ADLs; drives.        Advanced Directives: none; has paper.  FULL CODE; no prolonged measures.  HCPOA: daughter/Misty Wenn-Maness.   Social Determinants of Health   Financial Resource Strain:    Difficulty of Paying Living Expenses:   Food Insecurity:    Worried About Charity fundraiser in the Last Year:    Arboriculturist in the Last Year:   Transportation Needs:    Film/video editor (Medical):    Lack of Transportation (Non-Medical):   Physical Activity:    Days of Exercise per Week:    Minutes of Exercise per Session:   Stress:    Feeling of Stress :   Social Connections:    Frequency of Communication with Friends and Family:    Frequency of Social Gatherings with Friends and Family:    Attends Religious Services:    Active Member of Clubs or Organizations:    Attends Archivist Meetings:    Marital Status:   Intimate Partner Violence:    Fear of Current or Ex-Partner:    Emotionally Abused:    Physically  Abused:    Sexually Abused:      Allergies  Allergen Reactions   Other Swelling   Hydrochlorothiazide     Hypercalcemia      Prior to Admission medications   Medication Sig Start Date End Date Taking? Authorizing Provider  Ascorbic Acid (VITAMIN C PO) Take by mouth daily.   Yes [provider]  aspirin 81 MG tablet Take 1 tablet (81 mg total) by mouth daily. 11/29/16  Yes Wardell Honour, MD  atorvastatin (LIPITOR) 40 MG tablet Take 1 tablet by mouth once daily 06/18/19  Yes Croitoru, Mihai, MD  Cholecalciferol (VITAMIN D3 PO) Take by mouth.   Yes [provider]  co-enzyme  Q-10 30 MG capsule Take 100 mg by mouth 2 (two) times daily.   Yes [provider]  fish oil-omega-3 fatty acids 1000 MG capsule Take 1 capsule by mouth 3 (three) times daily.   Yes [provider]  meloxicam (MOBIC) 7.5 MG tablet Take 1 tablet by mouth once daily 08/29/19  Yes Rutherford Guys, MD  Multiple Vitamins-Minerals (RA VISION-VITE PRESERVE PO) Take by mouth.   Yes [provider]  valsartan (DIOVAN) 80 MG tablet Take 1 tablet (80 mg total) by mouth daily. 03/07/19  Yes Rutherford Guys, MD  White Petrolatum-Mineral Oil (SYSTANE NIGHTTIME OP) Apply to eye.   Yes [provider]  diclofenac Sodium (VOLTAREN) 1 % GEL Apply 4 g topically 4 (four) times daily. 03/07/19   Rutherford Guys, MD     Depression screen [facility] 2/9 09/09/2019 03/07/2019 09/06/2018 02/16/2018 02/16/2018  Decreased Interest 0 0 0 0 0  Down, Depressed, Hopeless 0 0 0 0 0  PHQ - 2 Score 0 0 0 0 0     Fall Risk  03/07/2019 09/06/2018 02/16/2018 02/16/2018 08/12/2017  Falls in the past year? 0 0 0 0 No  Number falls in past yr: 0 0 - - -  Injury with Fall? 0 0 - - -      PHYSICAL EXAM: BP 115/60 Comment: per patient   Pulse 89    Temp (!) 96.9 F (36.1 C) Comment: per patient   Ht 5' 1.34" (1.558 m)    Wt 181 lb (82.1 kg)    SpO2 96%    BMI 33.82 kg/m    Wt Readings from Last 3  Encounters:  09/09/19 181 lb (82.1 kg)  03/12/19 181 lb (82.1 kg)  05/15/18 176 lb (79.8 kg)    Medicare annual wellness visit, subsequent     Education/Counseling provided regarding diet and exercise, prevention of chronic diseases, smoking/tobacco cessation, if applicable, and reviewed "Covered Medicare Preventive Services."

## 2019-09-09 NOTE — Patient Instructions (Addendum)
Thank you for taking time to come for your Medicare Wellness Visit. I appreciate your ongoing commitment to your health goals. Please review the following plan we discussed and let me know if I can assist you in the future.  Marieme Mcmackin LPN  Preventive Care 72 Years and Older, Female Preventive care refers to lifestyle choices and visits with your health care provider that can promote health and wellness. This includes:  A yearly physical exam. This is also called an annual well check.  Regular dental and eye exams.  Immunizations.  Screening for certain conditions.  Healthy lifestyle choices, such as diet and exercise. What can I expect for my preventive care visit? Physical exam Your health care provider will check:  Height and weight. These may be used to calculate body mass index (BMI), which is a measurement that tells if you are at a healthy weight.  Heart rate and blood pressure.  Your skin for abnormal spots. Counseling Your health care provider may ask you questions about:  Alcohol, tobacco, and drug use.  Emotional well-being.  Home and relationship well-being.  Sexual activity.  Eating habits.  History of falls.  Memory and ability to understand (cognition).  Work and work environment.  Pregnancy and menstrual history. What immunizations do I need?  Influenza (flu) vaccine  This is recommended every year. Tetanus, diphtheria, and pertussis (Tdap) vaccine  You may need a Td booster every 10 years. Varicella (chickenpox) vaccine  You may need this vaccine if you have not already been vaccinated. Zoster (shingles) vaccine  You may need this after age 60. Pneumococcal conjugate (PCV13) vaccine  One dose is recommended after age 72. Pneumococcal polysaccharide (PPSV23) vaccine  One dose is recommended after age 72. Measles, mumps, and rubella (MMR) vaccine  You may need at least one dose of MMR if you were born in 1957 or later. You may also  need a second dose. Meningococcal conjugate (MenACWY) vaccine  You may need this if you have certain conditions. Hepatitis A vaccine  You may need this if you have certain conditions or if you travel or work in places where you may be exposed to hepatitis A. Hepatitis B vaccine  You may need this if you have certain conditions or if you travel or work in places where you may be exposed to hepatitis B. Haemophilus influenzae type b (Hib) vaccine  You may need this if you have certain conditions. You may receive vaccines as individual doses or as more than one vaccine together in one shot (combination vaccines). Talk with your health care provider about the risks and benefits of combination vaccines. What tests do I need? Blood tests  Lipid and cholesterol levels. These may be checked every 5 years, or more frequently depending on your overall health.  Hepatitis C test.  Hepatitis B test. Screening  Lung cancer screening. You may have this screening every year starting at age 72 if you have a 30-pack-year history of smoking and currently smoke or have quit within the past 15 years.  Colorectal cancer screening. All adults should have this screening starting at age 72 and continuing until age 75. Your health care provider may recommend screening at age 45 if you are at increased risk. You will have tests every 1-10 years, depending on your results and the type of screening test.  Diabetes screening. This is done by checking your blood sugar (glucose) after you have not eaten for a while (fasting). You may have this done every 1-3   years.  Mammogram. This may be done every 1-2 years. Talk with your health care provider about how often you should have regular mammograms.  BRCA-related cancer screening. This may be done if you have a family history of breast, ovarian, tubal, or peritoneal cancers. Other tests  Sexually transmitted disease (STD) testing.  Bone density scan. This is done  to screen for osteoporosis. You may have this done starting at age 72. Follow these instructions at home: Eating and drinking  Eat a diet that includes fresh fruits and vegetables, whole grains, lean protein, and low-fat dairy products. Limit your intake of foods with high amounts of sugar, saturated fats, and salt.  Take vitamin and mineral supplements as recommended by your health care provider.  Do not drink alcohol if your health care provider tells you not to drink.  If you drink alcohol: ? Limit how much you have to 0-1 drink a day. ? Be aware of how much alcohol is in your drink. In the [place] one drink equals one 12 oz bottle of beer (355 mL), one 5 oz glass of wine (148 mL), or one 1 oz glass of hard liquor (44 mL). Lifestyle  Take daily care of your teeth and gums.  Stay active. Exercise for at least 30 minutes on 5 or more days each week.  Do not use any products that contain nicotine or tobacco, such as cigarettes, e-cigarettes, and chewing tobacco. If you need help quitting, ask your health care provider.  If you are sexually active, practice safe sex. Use a condom or other form of protection in order to prevent STIs (sexually transmitted infections).  Talk with your health care provider about taking a low-dose aspirin or statin. What's next?  Go to your health care provider once a year for a well check visit.  Ask your health care provider how often you should have your eyes and teeth checked.  Stay up to date on all vaccines. This information is not intended to replace advice given to you by your health care provider. Make sure you discuss any questions you have with your health care provider. Document Revised: 03/15/2018 Document Reviewed: 03/15/2018 Elsevier Patient Education  2020 [employer].

## 2019-09-19 ENCOUNTER — Telehealth: Payer: Self-pay | Admitting: Family Medicine

## 2019-09-19 MED ORDER — OXYBUTYNIN CHLORIDE 5 MG PO TABS: 5.0000 mg | ORAL_TABLET | Freq: Two times a day (BID) | ORAL | 1 refills | Status: DC

## 2019-09-19 NOTE — Telephone Encounter (Signed)
I do not see this rx on patient med list nor mention in last office note for her to take this med. Please advise if this is ok to sent into the pharmacy

## 2019-09-19 NOTE — Telephone Encounter (Signed)
Pt called and stated she called in the Rx yesterday 09/18/19 to pharmacy and is was wanting [place] to send in to pharmacy. Please advise.  oxybutynin (DITROPAN) 5 MG tablet [[id number]]  [place] ([place]), [place] - [address]

## 2019-09-19 NOTE — Telephone Encounter (Signed)
Patient has been on this medication for long time In dec 2020 she was doing well For unclear reason medication dropped from list Medication refilled

## 2019-09-20 ENCOUNTER — Other Ambulatory Visit: Payer: Self-pay | Admitting: Family Medicine

## 2019-10-22 ENCOUNTER — Other Ambulatory Visit: Payer: Self-pay | Admitting: Family Medicine

## 2019-10-22 DIAGNOSIS — Z1231 Encounter for screening mammogram for malignant neoplasm of breast: Secondary | ICD-10-CM

## 2019-11-05 ENCOUNTER — Ambulatory Visit
Admission: RE | Admit: 2019-11-05 | Discharge: 2019-11-05 | Disposition: A | Discharge status: Home / Self Care | Payer: Medicare Other | Source: Ambulatory Visit | Attending: Family Medicine | Admitting: Family Medicine

## 2019-11-05 ENCOUNTER — Other Ambulatory Visit: Payer: Self-pay

## 2019-11-05 DIAGNOSIS — Z1231 Encounter for screening mammogram for malignant neoplasm of breast: Secondary | ICD-10-CM | POA: Diagnosis not present

## 2019-12-03 ENCOUNTER — Other Ambulatory Visit: Payer: Self-pay | Admitting: Cardiovascular Disease

## 2019-12-11 ENCOUNTER — Encounter: Payer: Self-pay | Admitting: Family Medicine

## 2020-01-07 ENCOUNTER — Telehealth: Payer: Self-pay | Admitting: Cardiovascular Disease

## 2020-01-07 DIAGNOSIS — I10 Essential (primary) hypertension: Secondary | ICD-10-CM

## 2020-01-07 DIAGNOSIS — E7800 Pure hypercholesterolemia, unspecified: Secondary | ICD-10-CM

## 2020-01-07 NOTE — Telephone Encounter (Signed)
The patient has been made aware. Lab orders placed.

## 2020-01-07 NOTE — Telephone Encounter (Signed)
Yes, lipids and CMET please

## 2020-01-07 NOTE — Telephone Encounter (Signed)
    Pt said she needs blood work before seeing Dr. Loletha Grayer in December. She said she needs lipids, liver function and calcium

## 2020-01-13 DIAGNOSIS — I10 Essential (primary) hypertension: Secondary | ICD-10-CM | POA: Diagnosis not present

## 2020-01-13 DIAGNOSIS — E7800 Pure hypercholesterolemia, unspecified: Secondary | ICD-10-CM | POA: Diagnosis not present

## 2020-01-14 ENCOUNTER — Other Ambulatory Visit: Payer: Self-pay | Admitting: *Deleted

## 2020-01-14 LAB — COMPREHENSIVE METABOLIC PANEL
ALT: 19 IU/L (ref 0–32)
AST: 17 IU/L (ref 0–40)
Albumin/Globulin Ratio: 2.2 (ref 1.2–2.2)
Albumin: 4.8 g/dL — ABNORMAL HIGH (ref 3.7–4.7)
Alkaline Phosphatase: 143 IU/L — ABNORMAL HIGH (ref 44–121)
BUN/Creatinine Ratio: 13 (ref 12–28)
BUN: 13 mg/dL (ref 8–27)
Bilirubin Total: 0.4 mg/dL (ref 0.0–1.2)
CO2: 23 mmol/L (ref 20–29)
Calcium: 10.6 mg/dL — ABNORMAL HIGH (ref 8.7–10.3)
Chloride: 104 mmol/L (ref 96–106)
Creatinine, Ser: 1.01 mg/dL — ABNORMAL HIGH (ref 0.57–1.00)
GFR calc Af Amer: 65 mL/min/{1.73_m2} (ref >59)
GFR calc non Af Amer: 56 mL/min/{1.73_m2} — ABNORMAL LOW (ref >59)
Globulin, Total: 2.2 g/dL (ref 1.5–4.5)
Glucose: 110 mg/dL — ABNORMAL HIGH (ref 65–99)
Potassium: 5.4 mmol/L — ABNORMAL HIGH (ref 3.5–5.2)
Sodium: 143 mmol/L (ref 134–144)
Total Protein: 7 g/dL (ref 6.0–8.5)

## 2020-01-14 LAB — LIPID PANEL
Chol/HDL Ratio: 4 ratio (ref 0.0–4.4)
Cholesterol, Total: 147 mg/dL (ref 100–199)
HDL: 37 mg/dL — ABNORMAL LOW (ref >39)
LDL Chol Calc (NIH): 84 mg/dL (ref 0–99)
Triglycerides: 151 mg/dL — ABNORMAL HIGH (ref 0–149)
VLDL Cholesterol Cal: 26 mg/dL (ref 5–40)

## 2020-01-14 MED ORDER — ATORVASTATIN CALCIUM 80 MG PO TABS: 80.0000 mg | ORAL_TABLET | Freq: Every day | ORAL | 3 refills | Status: DC

## 2020-01-28 ENCOUNTER — Ambulatory Visit: Payer: Self-pay | Admitting: *Deleted

## 2020-01-28 DIAGNOSIS — Z23 Encounter for immunization: Secondary | ICD-10-CM | POA: Diagnosis not present

## 2020-01-28 NOTE — Telephone Encounter (Signed)
Patient called and requested the name of the drug she had listed as her allergy. Reviewed with patient hydrochlorothiazide is listed as the allergy with the reaction type as hypersensitivity. Patient report the mediation caused her to swell.   Reason for Disposition  General information question, no triage required and triager able to answer question  Answer Assessment - Initial Assessment Questions 1. REASON FOR CALL or QUESTION: "What is your reason for calling today?" or "How can I best help you?" or "What question do you have that I can help answer?"     What is the name of the drug listed on my allergies?  Protocols used: INFORMATION ONLY CALL - NO TRIAGE-A-AH

## 2020-03-05 ENCOUNTER — Other Ambulatory Visit: Payer: Self-pay

## 2020-03-05 ENCOUNTER — Ambulatory Visit (HOSPITAL_COMMUNITY): Payer: Medicare Other | Attending: Cardiovascular Disease

## 2020-03-05 ENCOUNTER — Other Ambulatory Visit: Payer: Self-pay | Admitting: Family Medicine

## 2020-03-05 DIAGNOSIS — I350 Nonrheumatic aortic (valve) stenosis: Secondary | ICD-10-CM | POA: Insufficient documentation

## 2020-03-05 LAB — ECHOCARDIOGRAM COMPLETE
AR max vel: 0.56 cm2
AV Area VTI: 0.55 cm2
AV Area mean vel: 0.56 cm2
AV Mean grad: 61 mmHg
AV Peak grad: 91.4 mmHg
Ao pk vel: 4.78 m/s
Area-P 1/2: 3.37 cm2
P 1/2 time: 345 msec
S' Lateral: 2.4 cm

## 2020-03-05 MED ORDER — MELOXICAM 7.5 MG PO TABS: 7.5000 mg | ORAL_TABLET | Freq: Every day | ORAL | 0 refills | Status: DC

## 2020-03-05 NOTE — Telephone Encounter (Signed)
Copied from [place] [id number]. Topic: Quick Communication - Rx Refill/Question >> Mar 05, 2020  1:38 PM Mcneil, Ja-Kwan wrote: Medication: meloxicam (MOBIC) 7.5 MG tablet and oxybutynin (DITROPAN) 5 MG tablet  Has the patient contacted their pharmacy? yes   Preferred Pharmacy (with phone number or street name): [facility] [place] [facility] [address]  Phone: [phone]   Fax: [phone]  Agent: Please be advised that RX refills may take up to 3 business days. We ask that you follow-up with your pharmacy.

## 2020-03-10 MED ORDER — OXYBUTYNIN CHLORIDE 5 MG PO TABS: 5.0000 mg | ORAL_TABLET | Freq: Two times a day (BID) | ORAL | 0 refills | Status: DC

## 2020-03-10 NOTE — Telephone Encounter (Signed)
Requested Prescriptions  Pending Prescriptions Disp Refills  . oxybutynin (DITROPAN) 5 MG tablet 60 tablet 0    Sig: Take 1 tablet (5 mg total) by mouth 2 (two) times daily.     Urology:  Bladder Agents Passed - 03/10/2020 10:48 AM      Passed - Valid encounter within last 12 months    Recent Outpatient Visits          6 months ago Medicare annual wellness visit, subsequent   [facility], Lilia Argue, MD   1 year ago Polycythemia   [facility], Lilia Argue, MD   1 year ago Essential hypertension   [facility], Lilia Argue, MD   1 year ago Encounter for Medicare annual wellness exam   [facility], Lilia Argue, MD   2 years ago Hypercalcemia   [facility], Lilia Argue, MD      Future Appointments            In 2 days Croitoru, [place], MD [place], [place]   In 1 month Wendie Agreste, MD [facility] at [place], [facility]           . meloxicam (MOBIC) 7.5 MG tablet 30 tablet     Sig: Take 1 tablet (7.5 mg total) by mouth daily.     Analgesics:  COX2 Inhibitors Failed - 03/10/2020 10:48 AM      Failed - HGB in normal range and within 360 days    Hemoglobin  Date Value Ref Range Status  03/08/2019 16.7 (H) 11.1 - 15.9 g/dL Final   HGB  Date Value Ref Range Status  06/01/2015 16.4 (H) 11.6 - 15.9 g/dL Final         Failed - Cr in normal range and within 360 days    Creatinine  Date Value Ref Range Status  06/01/2015 1.1 0.6 - 1.1 mg/dL Final   Creat  Date Value Ref Range Status  11/18/2015 1.00 (H) 0.50 - 0.99 mg/dL Final    Comment:      For patients > or = 72 years of age: The upper reference limit for Creatinine is approximately 13% higher for people identified as African-American.      Creatinine, Ser  Date Value Ref Range Status  01/13/2020 1.01 (H) 0.57 - 1.00 mg/dL Final         Passed - Patient is not pregnant      Passed - Valid encounter within last 12 months     Recent Outpatient Visits          6 months ago Medicare annual wellness visit, subsequent   [facility], Lilia Argue, MD   1 year ago Polycythemia   [facility], Lilia Argue, MD   1 year ago Essential hypertension   [facility], Lilia Argue, MD   1 year ago Encounter for Medicare annual wellness exam   [facility], Lilia Argue, MD   2 years ago Hypercalcemia   [facility], Lilia Argue, MD      Future Appointments            In 2 days Croitoru, Dani Gobble, MD [place], [place]   In 1 month Carlota Raspberry Ranell Patrick, MD Primary [facility] at [place], [facility]           Signed Prescriptions Disp Refills   meloxicam (  MOBIC) 7.5 MG tablet 30 tablet 0    Sig: Take 1 tablet (7.5 mg total) by mouth daily.     Analgesics:  COX2 Inhibitors Failed - 03/05/2020  1:48 PM      Failed - HGB in normal range and within 360 days    Hemoglobin  Date Value Ref Range Status  03/08/2019 16.7 (H) 11.1 - 15.9 g/dL Final   HGB  Date Value Ref Range Status  06/01/2015 16.4 (H) 11.6 - 15.9 g/dL Final         Failed - Cr in normal range and within 360 days    Creatinine  Date Value Ref Range Status  06/01/2015 1.1 0.6 - 1.1 mg/dL Final   Creat  Date Value Ref Range Status  11/18/2015 1.00 (H) 0.50 - 0.99 mg/dL Final    Comment:      For patients > or = 72 years of age: The upper reference limit for Creatinine is approximately 13% higher for people identified as African-American.      Creatinine, Ser  Date Value Ref Range Status  01/13/2020 1.01 (H) 0.57 - 1.00 mg/dL Final         Passed - Patient is not pregnant      Passed - Valid encounter within last 12 months    Recent Outpatient Visits          6 months ago Medicare annual wellness visit, subsequent   [facility], Lilia Argue, MD   1 year ago Polycythemia   [facility], Lilia Argue, MD   1 year ago Essential hypertension    [facility], Lilia Argue, MD   1 year ago Encounter for Medicare annual wellness exam   [facility], Lilia Argue, MD   2 years ago Hypercalcemia   [facility], Lilia Argue, MD      Future Appointments            In 2 days Croitoru, Dani Gobble, MD [place], [place]   In 1 month Carlota Raspberry, Ranell Patrick, MD Primary Care at [place], [facility]

## 2020-03-10 NOTE — Telephone Encounter (Signed)
oxybutynin (DITROPAN pt states that when this was requested on 12/2 she did not get the oxybutynin. Pharmacy states did not receive? Please resend pt out.

## 2020-03-10 NOTE — Addendum Note (Signed)
Addended by: Carlisle Beers on: 03/10/2020 10:48 AM   Modules accepted: Orders

## 2020-03-10 NOTE — Telephone Encounter (Signed)
Requested Prescriptions  Pending Prescriptions Disp Refills  . oxybutynin (DITROPAN) 5 MG tablet 60 tablet 0    Sig: Take 1 tablet (5 mg total) by mouth 2 (two) times daily.     Urology:  Bladder Agents Passed - 03/10/2020 10:48 AM      Passed - Valid encounter within last 12 months    Recent Outpatient Visits          6 months ago Medicare annual wellness visit, subsequent   [facility], Lilia Argue, MD   1 year ago Polycythemia   [facility], Lilia Argue, MD   1 year ago Essential hypertension   [facility], Lilia Argue, MD   1 year ago Encounter for Medicare annual wellness exam   [facility], Lilia Argue, MD   2 years ago Hypercalcemia   [facility], Lilia Argue, MD      Future Appointments            In 2 days Croitoru, Dani Gobble, MD [place], [place]   In 1 month Wendie Agreste, MD [facility] at [place], [facility]           Signed Prescriptions Disp Refills   meloxicam (MOBIC) 7.5 MG tablet 30 tablet 0    Sig: Take 1 tablet (7.5 mg total) by mouth daily.     Analgesics:  COX2 Inhibitors Failed - 03/05/2020  1:48 PM      Failed - HGB in normal range and within 360 days    Hemoglobin  Date Value Ref Range Status  03/08/2019 16.7 (H) 11.1 - 15.9 g/dL Final   HGB  Date Value Ref Range Status  06/01/2015 16.4 (H) 11.6 - 15.9 g/dL Final         Failed - Cr in normal range and within 360 days    Creatinine  Date Value Ref Range Status  06/01/2015 1.1 0.6 - 1.1 mg/dL Final   Creat  Date Value Ref Range Status  11/18/2015 1.00 (H) 0.50 - 0.99 mg/dL Final    Comment:      For patients > or = 72 years of age: The upper reference limit for Creatinine is approximately 13% higher for people identified as African-American.      Creatinine, Ser  Date Value Ref Range Status  01/13/2020 1.01 (H) 0.57 - 1.00 mg/dL Final         Passed - Patient is not pregnant      Passed - Valid  encounter within last 12 months    Recent Outpatient Visits          6 months ago Medicare annual wellness visit, subsequent   [facility], Lilia Argue, MD   1 year ago Polycythemia   [facility], Lilia Argue, MD   1 year ago Essential hypertension   [facility], Lilia Argue, MD   1 year ago Encounter for Medicare annual wellness exam   [facility], Lilia Argue, MD   2 years ago Hypercalcemia   [facility], Lilia Argue, MD      Future Appointments            In 2 days Croitoru, Dani Gobble, MD [place], [place]   In 1 month Carlota Raspberry, Ranell Patrick, MD [facility] at [place], [place]  Prescriptions Disp Refills  . meloxicam (MOBIC) 7.5 MG tablet 30 tablet     Sig: Take 1 tablet (7.5 mg total) by mouth daily.     Analgesics:  COX2 Inhibitors Failed - 03/10/2020 10:48 AM      Failed - HGB in normal range and within 360 days    Hemoglobin  Date Value Ref Range Status  03/08/2019 16.7 (H) 11.1 - 15.9 g/dL Final   HGB  Date Value Ref Range Status  06/01/2015 16.4 (H) 11.6 - 15.9 g/dL Final         Failed - Cr in normal range and within 360 days    Creatinine  Date Value Ref Range Status  06/01/2015 1.1 0.6 - 1.1 mg/dL Final   Creat  Date Value Ref Range Status  11/18/2015 1.00 (H) 0.50 - 0.99 mg/dL Final    Comment:      For patients > or = 72 years of age: The upper reference limit for Creatinine is approximately 13% higher for people identified as African-American.      Creatinine, Ser  Date Value Ref Range Status  01/13/2020 1.01 (H) 0.57 - 1.00 mg/dL Final         Passed - Patient is not pregnant      Passed - Valid encounter within last 12 months    Recent Outpatient Visits          6 months ago Medicare annual wellness visit, subsequent   [facility], Lilia Argue, MD   1 year ago Polycythemia   [facility], Lilia Argue, MD   1  year ago Essential hypertension   [facility], Lilia Argue, MD   1 year ago Encounter for Medicare annual wellness exam   [facility], Lilia Argue, MD   2 years ago Hypercalcemia   [facility], Lilia Argue, MD      Future Appointments            In 2 days Croitoru, Dani Gobble, MD [place], [place]   In 1 month Carlota Raspberry, Ranell Patrick, MD Primary [facility] at [place], [facility]

## 2020-03-12 ENCOUNTER — Ambulatory Visit (INDEPENDENT_AMBULATORY_CARE_PROVIDER_SITE_OTHER): Payer: Medicare Other | Admitting: Cardiovascular Disease

## 2020-03-12 ENCOUNTER — Other Ambulatory Visit: Payer: Self-pay

## 2020-03-12 ENCOUNTER — Encounter: Payer: Self-pay | Admitting: Cardiovascular Disease

## 2020-03-12 VITALS — BP 132/68 | HR 72 | Ht 61.75 in | Wt 177.0 lb

## 2020-03-12 DIAGNOSIS — E7800 Pure hypercholesterolemia, unspecified: Secondary | ICD-10-CM | POA: Diagnosis not present

## 2020-03-12 DIAGNOSIS — I10 Essential (primary) hypertension: Secondary | ICD-10-CM | POA: Diagnosis not present

## 2020-03-12 DIAGNOSIS — I2510 Atherosclerotic heart disease of native coronary artery without angina pectoris: Secondary | ICD-10-CM | POA: Diagnosis not present

## 2020-03-12 DIAGNOSIS — E669 Obesity, unspecified: Secondary | ICD-10-CM | POA: Diagnosis not present

## 2020-03-12 DIAGNOSIS — I350 Nonrheumatic aortic (valve) stenosis: Secondary | ICD-10-CM | POA: Diagnosis not present

## 2020-03-12 NOTE — Progress Notes (Signed)
Cardiology Office Note    Date:  03/12/2020   ID:  Sierra Carpenter, Tenenbaum [birth year], MRN [id number]  PCP:  Rutherford Guys, MD (Inactive)  Cardiologist:   Sanda Klein, MD   Chief complaint: AS    History of Present Illness:  Sierra Carpenter is a 74 y.o. female with degenerative aortic stenosis, coronary artery disease, hypertension and hyperlipidemia.  The patient specifically denies any chest pain at rest exertion, dyspnea at rest or with exertion, orthopnea, paroxysmal nocturnal dyspnea, syncope, palpitations, focal neurological deficits, intermittent claudication, lower extremity edema, unexplained weight gain, cough, hemoptysis or wheezing.  She remains very active: raking her own leaves, picking up limbs, doing all her own housework.  In 2000 she had coronary angiography that showed minor coronary artery disease (50% ostial first diagonal artery, 30% proximal RCA).  She had a normal stress test in November 2018.    Follow up echo just performed shows that she has progressed to severe AS (mean gradient around 60 mm Hg, peak velocity 4.8 m/s).  LDL was 84 so a couple of months ago increased her atorvastatin dose, with plans to recheck it today.  Past Medical History:  Diagnosis Date  . Aortic valve sclerosis   . Coronary atherosclerosis    without significant obstruction  . Heart murmur   . History of nuclear stress test 07/12/2010   bruce protocol myoview; normal pattern of perfusion, low risk, post-stress EF 92%  . Hyperlipidemia   . Hypertension     Past Surgical History:  Procedure Laterality Date  . ABDOMINAL HYSTERECTOMY     fibroids; ovaries intact.  . APPENDECTOMY    . CARDIAC CATHETERIZATION     moderate stenosis in 1st diagonal & RCA  . CESAREAN SECTION    . CHOLECYSTECTOMY    . EYE SURGERY    . TRANSTHORACIC ECHOCARDIOGRAM  07/12/2010   EF=>55% with normal systolic function; trace MR/TR/PR    Current Medications: Outpatient Medications Prior to Visit   Medication Sig Dispense Refill  . Ascorbic Acid (VITAMIN C PO) Take by mouth daily.    Marland Kitchen aspirin 81 MG tablet Take 1 tablet (81 mg total) by mouth daily. 1 tablet   . atorvastatin (LIPITOR) 80 MG tablet Take 1 tablet (80 mg total) by mouth daily. 30 tablet 3  . Cholecalciferol (VITAMIN D3 PO) Take by mouth.    . co-enzyme Q-10 30 MG capsule Take 100 mg by mouth 2 (two) times daily.    . fish oil-omega-3 fatty acids 1000 MG capsule Take 1 capsule by mouth 3 (three) times daily.    . meloxicam (MOBIC) 7.5 MG tablet Take 1 tablet (7.5 mg total) by mouth daily. 30 tablet 0  . Multiple Vitamins-Minerals (RA VISION-VITE PRESERVE PO) Take by mouth.    . oxybutynin (DITROPAN) 5 MG tablet Take 1 tablet (5 mg total) by mouth 2 (two) times daily. 60 tablet 0  . valsartan (DIOVAN) 80 MG tablet Take 1 tablet (80 mg total) by mouth daily. 90 tablet 3  . White Petrolatum-Mineral Oil (SYSTANE NIGHTTIME OP) Apply to eye.    . diclofenac Sodium (VOLTAREN) 1 % GEL Apply 4 g topically 4 (four) times daily. 100 g 2   No facility-administered medications prior to visit.     Allergies:   Other and Hydrochlorothiazide   Social History   Socioeconomic History  . Marital status: Widowed    Spouse name: Not on file  . Number of children: 1  . Years of education:  12  . Highest education level: Not on file  Occupational History  . Occupation: retired    Comment: Production designer, theatre/television/film  Tobacco Use  . Smoking status: Never Smoker  . Smokeless tobacco: Never Used  Substance and Sexual Activity  . Alcohol use: No  . Drug use: No  . Sexual activity: Yes    Birth control/protection: Post-menopausal, Surgical  Other Topics Concern  . Not on file  Social History Narrative   Marital status: widowed since 2014 due to COPD; married x 37 years.      Children: 1 daughter; 1 son died of CHF age 9 pacemaker.  4 grandchildren.      Lives: alone      Employment: retired at age 48 from [employer].      Tobacco:   never       Alcohol:  never      Exercise: yes in 2019; walking 1-2 miles daily.  DDD lumbar limiting walking.      ADLs: independent with ADLs; drives.        Advanced Directives: none; has paper.  FULL CODE; no prolonged measures.  HCPOA: daughter/Misty Wenn-Maness.   Social Determinants of Health   Financial Resource Strain: Not on file  Food Insecurity: Not on file  Transportation Needs: Not on file  Physical Activity: Not on file  Stress: Not on file  Social Connections: Not on file     Family History:  The patient's family history includes Alzheimer's disease in her mother; COPD in her brother; Heart disease in her brother, brother, brother, paternal grandfather, and son; Heart disease (age of onset: 42) in her father.   ROS:   Please see the history of present illness.    ROS All other systems are reviewed and are negative.   PHYSICAL EXAM:   VS:  BP 132/68   Pulse 72   Ht 5' 1.75" (1.568 m)   Wt 177 lb (80.3 kg)   SpO2 94%   BMI 32.64 kg/m      General: Alert, oriented x3, no distress, mildly obese Head: no evidence of trauma, PERRL, EOMI, no exophtalmos or lid lag, no myxedema, no xanthelasma; normal ears, nose and oropharynx Neck: normal jugular venous pulsations and no hepatojugular reflux; brisk carotid pulses without delay and no carotid bruits Chest: clear to auscultation, no signs of consolidation by percussion or palpation, normal fremitus, symmetrical and full respiratory excursions Cardiovascular: normal position and quality of the apical impulse, regular rhythm, late peaking Ao ejection murmur, normal first and distinct second heart sounds, no diastolic murmurs, rubs or gallops Abdomen: no tenderness or distention, no masses by palpation, no abnormal pulsatility or arterial bruits, normal bowel sounds, no hepatosplenomegaly Extremities: no clubbing, cyanosis or edema; 2+ radial, ulnar and brachial pulses bilaterally; 2+ right femoral, posterior tibial and  dorsalis pedis pulses; 2+ left femoral, posterior tibial and dorsalis pedis pulses; no subclavian or femoral bruits Neurological: grossly nonfocal Psych: Normal mood and affect   Wt Readings from Last 3 Encounters:  03/12/20 177 lb (80.3 kg)  09/09/19 181 lb (82.1 kg)  03/12/19 181 lb (82.1 kg)      Studies/Labs Reviewed:   ECHO Oct 2021: 1. The aortic valve has an indeterminant number of cusps. There is severe  calcifcation of the aortic valve. There is severe thickening of the aortic  valve. Aortic valve regurgitation is mild. Severe aortic valve stenosis.  Aortic valve area, by VTI  measures 0.55 cm. Aortic valve mean gradient measures 61.0  mmHg. Aortic  valve Vmax measures 4.78 m/s.  2. Left ventricular ejection fraction, by estimation, is 65 to 70%. The  left ventricle has normal function. The left ventricle has no regional  wall motion abnormalities. There is mild concentric left ventricular  hypertrophy. Left ventricular diastolic  parameters are consistent with Grade I diastolic dysfunction (impaired  relaxation). Elevated left atrial pressure. The E/e' is 20.8.  3. Right ventricular systolic function is normal. The right ventricular  size is normal. There is normal pulmonary artery systolic pressure. The  estimated right ventricular systolic pressure is 8.9 mmHg.  4. The mitral valve is grossly normal. Mild mitral valve regurgitation.  No evidence of mitral stenosis.  5. The inferior vena cava is normal in size with greater than 50%  respiratory variability, suggesting right atrial pressure of 3 mmHg.   Comparison(s): Prior images unable to be directly viewed, comparison made  by report only. Changes from prior study are noted. Aortic stenosis now  severe.   EKG:  EKG is ordered today.  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 Non-rheumatic aortic stenosis   2. Hypercholesterolemia   3. Atherosclerosis of native coronary artery of native heart without angina pectoris   4. Essential hypertension   5. Mild obesity      PLAN:  In order of problems listed above:  1. CAD: remains reasonably active and asymtomatic.   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 2. AS: Her exam and echo are now consistent with severe AS. Anticipate that she will become symptomatic in the next 6 months and have discussed future workup and plan for TAVR vs. SAVR based mostly on whether there has been CAD progression. 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  I reminded her to promptly report exertional angina, exertional dyspnea or exertional syncope.  F/u sooner, in 6 months. 3. HTN: good control on valsartan monotherapy 4. HLP: increased statin. Recheck today. 5. Obesity: Encouraged her again to try to lose some weight. Wheteher she needs SAVR or TAVR, risk of complications would be lower if she is leaner.   Medication Adjustments/Labs and Tests Ordered: Current medicines are reviewed at length with the patient today.  Concerns regarding medicines are outlined above.  Medication changes, Labs and Tests ordered today are listed in the Patient Instructions below. Patient Instructions  Medication Instructions:  No changes *If you need a refill on your cardiac medications before your next appointment, please call your pharmacy*   Lab Work: Fasting Lipids today If you have labs (blood work) drawn today and your tests are completely normal,  you will receive your results only by: Marland Kitchen MyChart Message (if you have MyChart) OR . A paper copy in the mail If you have any lab test that is abnormal or we need to change your treatment, we will call you to review the results.   Testing/Procedures: Your physician has requested that you  have an echocardiogram in 6 months. Echocardiography is a painless test that uses sound waves to create images of your heart. It provides your doctor with information about the size and shape of your heart and how well your heart's chambers and valves are working. You may receive an ultrasound enhancing agent through an IV if needed to better visualize your heart during the echo.This procedure takes approximately one hour. There are no restrictions for this procedure. This will take place at the [address].     Follow-Up: At [facility], you and your health needs are our priority.  As part of our continuing mission to provide you with exceptional heart care, we have created designated Provider Care Teams.  These Care Teams include your primary Cardiologist (physician) and Advanced Practice Providers (APPs -  Physician Assistants and Nurse Practitioners) who all work together to provide you with the care you need, when you need it.  We recommend signing up for the patient portal called "MyChart".  Sign up information is provided on this After Visit Summary.  MyChart is used to connect with patients for Virtual Visits (Telemedicine).  Patients are able to view lab/test results, encounter notes, upcoming appointments, etc.  Non-urgent messages can be sent to your provider as well.   To learn more about what you can do with MyChart, go to [email or web address].    Your next appointment:   12 month(s)  The format for your next appointment:   In Person  Provider:   You may see Sanda Klein, MD or one of the following Advanced Practice Providers on your designated Care Team:    Almyra Deforest,  PA-C  Fabian Sharp, [place] or   Roby Lofts, PA-C      Signed, Sanda Klein, MD  03/12/2020 8:30 AM    [facility] [place], [place], [place]  [address] Phone: [phone]; Fax: [phone]

## 2020-03-12 NOTE — Patient Instructions (Addendum)
Medication Instructions:  No changes *If you need a refill on your cardiac medications before your next appointment, please call your pharmacy*   Lab Work: Fasting Lipids today If you have labs (blood work) drawn today and your tests are completely normal, you will receive your results only by: Marland Kitchen MyChart Message (if you have MyChart) OR . A paper copy in the mail If you have any lab test that is abnormal or we need to change your treatment, we will call you to review the results.   Testing/Procedures: Your physician has requested that you have an echocardiogram in 6 months. Echocardiography is a painless test that uses sound waves to create images of your heart. It provides your doctor with information about the size and shape of your heart and how well your heart's chambers and valves are working. You may receive an ultrasound enhancing agent through an IV if needed to better visualize your heart during the echo.This procedure takes approximately one hour. There are no restrictions for this procedure. This will take place at the [address].     Follow-Up: At [facility], you and your health needs are our priority.  As part of our continuing mission to provide you with exceptional heart care, we have created designated Provider Care Teams.  These Care Teams include your primary Cardiologist (physician) and Advanced Practice Providers (APPs -  Physician Assistants and Nurse Practitioners) who all work together to provide you with the care you need, when you need it.  We recommend signing up for the patient portal called "MyChart".  Sign up information is provided on this After Visit Summary.  MyChart is used to connect with patients for Virtual Visits (Telemedicine).  Patients are able to view lab/test results, encounter notes, upcoming appointments, etc.  Non-urgent messages can be sent to your provider as well.   To learn more about what you can do with MyChart, go to  [email or web address].    Your next appointment:   12 month(s)  The format for your next appointment:   In Person  Provider:   You may see Sanda Klein, MD or one of the following Advanced Practice Providers on your designated Care Team:    Almyra Deforest, PA-C  Fabian Sharp, PA-C or   Roby Lofts, [place]

## 2020-03-13 LAB — LIPID PANEL
Chol/HDL Ratio: 3.5 ratio (ref 0.0–4.4)
Cholesterol, Total: 136 mg/dL (ref 100–199)
HDL: 39 mg/dL — ABNORMAL LOW (ref >39)
LDL Chol Calc (NIH): 72 mg/dL (ref 0–99)
Triglycerides: 143 mg/dL (ref 0–149)
VLDL Cholesterol Cal: 25 mg/dL (ref 5–40)

## 2020-03-30 ENCOUNTER — Encounter: Payer: Medicare Other | Admitting: Family Medicine

## 2020-04-09 ENCOUNTER — Other Ambulatory Visit: Payer: Self-pay

## 2020-04-09 ENCOUNTER — Ambulatory Visit (INDEPENDENT_AMBULATORY_CARE_PROVIDER_SITE_OTHER): Payer: Medicare Other | Admitting: Family Medicine

## 2020-04-09 ENCOUNTER — Encounter: Payer: Self-pay | Admitting: Family Medicine

## 2020-04-09 VITALS — BP 125/76 | HR 92 | Temp 97.6°F | Ht 61.0 in | Wt 172.0 lb

## 2020-04-09 DIAGNOSIS — E875 Hyperkalemia: Secondary | ICD-10-CM

## 2020-04-09 DIAGNOSIS — G8929 Other chronic pain: Secondary | ICD-10-CM

## 2020-04-09 DIAGNOSIS — E7800 Pure hypercholesterolemia, unspecified: Secondary | ICD-10-CM

## 2020-04-09 DIAGNOSIS — I350 Nonrheumatic aortic (valve) stenosis: Secondary | ICD-10-CM

## 2020-04-09 DIAGNOSIS — I10 Essential (primary) hypertension: Secondary | ICD-10-CM | POA: Diagnosis not present

## 2020-04-09 DIAGNOSIS — N3941 Urge incontinence: Secondary | ICD-10-CM

## 2020-04-09 DIAGNOSIS — M545 Low back pain, unspecified: Secondary | ICD-10-CM | POA: Diagnosis not present

## 2020-04-09 MED ORDER — OXYBUTYNIN CHLORIDE 5 MG PO TABS: 5.0000 mg | ORAL_TABLET | Freq: Two times a day (BID) | ORAL | 3 refills | Status: DC

## 2020-04-09 MED ORDER — VALSARTAN 80 MG PO TABS: 80.0000 mg | ORAL_TABLET | Freq: Every day | ORAL | 3 refills | Status: DC

## 2020-04-09 MED ORDER — MELOXICAM 7.5 MG PO TABS: 7.5000 mg | ORAL_TABLET | Freq: Every day | ORAL | 1 refills | Status: DC

## 2020-04-09 NOTE — Progress Notes (Signed)
Subjective:  Patient ID: Sierra Carpenter, female    DOB: 1945-06-08  Age: 75 y.o. MRN: [id number]  CC:  Chief Complaint  Patient presents with  . Establish Care    Est care with no other issues at this time. Patient just would like you make you aware last seen Cardio 03/12/20 and will be having another Eco in [place] due to a change in her heart murmur.    HPI Sierra Carpenter presents for   Establish care, previous primary care provider Dr. Pamella Pert.  Cardiac Cardiologist Dr. Sallyanne Kuster.  Appointment December 9.  History of degenerative aortic stenosis, CAD, hypertension, hyperlipidemia.  Asymptomatic at that visit.  Coronary angiography in 2000 with moderate CAD 50% ostial first diagonal, 30% proximal RCA.  Normal stress test in November 2018.  Recent echo in October 2021 indicated progression to severe aortic stenosis with mean gradient around 60 mm, peak velocity 4.8 ms.  Anticipated symptomatic AAS in the next 6 months with plan for TAVR versus SAVR based mostly on whether there has been a CAD progression.  Recommended to promptly report any exertional angina, dyspnea or exertional syncope.  31-month follow-up planned. Denies new CP/dyspnea/near-syncope.no palpitations.  Active at home.   Hypertension: Valsartan 80 mg daily. Home readings:  125/60.  Asymptomatic as above.  BP Readings from Last 3 Encounters:  04/09/20 125/76  03/12/20 132/68  09/09/19 115/60   Lab Results  Component Value Date   CREATININE 1.01 (H) 01/13/2020   Hyperlipidemia: Lipitor dose has been increased to 80 mg by cardiology, most recent testing in December.  Lab Results  Component Value Date   CHOL 136 03/12/2020   HDL 39 (L) 03/12/2020   LDLCALC 72 03/12/2020   TRIG 143 03/12/2020   CHOLHDL 3.5 03/12/2020   Lab Results  Component Value Date   ALT 19 01/13/2020   AST 17 01/13/2020   ALKPHOS 143 (H) 01/13/2020   BILITOT 0.4 01/13/2020   Urge incontinence Oxybutynin 5mg  BID. Working well, rare  breakthough symptoms, No lightheadedness or dizziness.   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 No injections.  prior treatment with PT, Dr. Lynann Bologna, home exercises.  Treated with Mobic 1/2 tab every other day. Rare full tablet if back pain flares. Aware of risks of meds. Symptoms overall controlled with current regimen.   Hypokalemia: Potassium 5.4 on 01/13/20.   Hypercalcemia: Improved in past with decrease in calcium supplements. Not on supplement in October when Ca 10.6.  Seen by endocrine in 2020, thought to be combo of supplements, hctz, MVi and hypervitaminosis A.  No PTH mediated.Marland Kitchen   History Patient Active Problem List   Diagnosis Date Noted  . Vitamin D deficiency 09/06/2018  . Urge incontinence 08/12/2017  . Class 1 obesity due to excess calories with serious comorbidity and body mass index (BMI) of 31.0 to 31.9 in adult 06/13/2016  . Paresthesia of lower lip 06/06/2016  . Non-rheumatic aortic stenosis 08/28/2015  . Polycythemia 06/01/2015  . Coronary atherosclerosis 01/01/2013  . Essential hypertension 05/09/2011  . Hypercholesterolemia 05/09/2011  . Colon polyps 05/09/2011   Past Medical History:  Diagnosis Date  . Aortic valve sclerosis   . Coronary atherosclerosis    without significant obstruction  . Heart murmur   . History of nuclear stress test 07/12/2010   bruce protocol myoview; normal pattern of perfusion, low risk, post-stress EF 92%  . Hyperlipidemia   . Hypertension    Past Surgical History:  Procedure Laterality Date  . ABDOMINAL  HYSTERECTOMY     fibroids; ovaries intact.  . APPENDECTOMY    . CARDIAC CATHETERIZATION     moderate stenosis in 1st diagonal & RCA  . CESAREAN SECTION    . CHOLECYSTECTOMY    . EYE SURGERY    . TRANSTHORACIC ECHOCARDIOGRAM  07/12/2010   EF=>55% with normal systolic function; trace MR/TR/PR   Allergies  Allergen Reactions  . Other Swelling  . Hydrochlorothiazide     Hypercalcemia    Prior to  Admission medications   Medication Sig Start Date End Date Taking? Authorizing Provider  Ascorbic Acid (VITAMIN C PO) Take by mouth daily.   Yes [provider]  aspirin 81 MG tablet Take 1 tablet (81 mg total) by mouth daily. 11/29/16  Yes Wardell Honour, MD  atorvastatin (LIPITOR) 80 MG tablet Take 1 tablet (80 mg total) by mouth daily. 01/14/20  Yes Croitoru, Mihai, MD  Cholecalciferol (VITAMIN D3 PO) Take by mouth.   Yes [provider]  co-enzyme Q-10 30 MG capsule Take 100 mg by mouth 2 (two) times daily.   Yes [provider]  fish oil-omega-3 fatty acids 1000 MG capsule Take 1 capsule by mouth 3 (three) times daily.   Yes [provider]  meloxicam (MOBIC) 7.5 MG tablet Take 1 tablet (7.5 mg total) by mouth daily. 03/05/20  Yes Wendie Agreste, MD  Multiple Vitamins-Minerals (RA VISION-VITE PRESERVE PO) Take by mouth.   Yes [provider]  oxybutynin (DITROPAN) 5 MG tablet Take 1 tablet (5 mg total) by mouth 2 (two) times daily. 03/10/20  Yes Wendie Agreste, MD  valsartan (DIOVAN) 80 MG tablet Take 1 tablet (80 mg total) by mouth daily. 03/07/19  Yes Jacelyn Pi, Lilia Argue, MD  White Petrolatum-Mineral Oil (SYSTANE NIGHTTIME OP) Apply to eye.   Yes [provider]   Social History   Socioeconomic History  . Marital status: Widowed    Spouse name: Not on file  . Number of children: 1  . Years of education: 31  . Highest education level: Not on file  Occupational History  . Occupation: retired    Comment: Production designer, theatre/television/film  Tobacco Use  . Smoking status: Never Smoker  . Smokeless tobacco: Never Used  Substance and Sexual Activity  . Alcohol use: No  . Drug use: No  . Sexual activity: Yes    Birth control/protection: Post-menopausal, Surgical  Other Topics Concern  . Not on file  Social History Narrative   Marital status: widowed since 2014 due to COPD; married x 37 years.      Children: 1 daughter; 1 son died of CHF  age 56 pacemaker.  4 grandchildren.      Lives: alone      Employment: retired at age 16 from [employer].      Tobacco:  never       Alcohol:  never      Exercise: yes in 2019; walking 1-2 miles daily.  DDD lumbar limiting walking.      ADLs: independent with ADLs; drives.        Advanced Directives: none; has paper.  FULL CODE; no prolonged measures.  HCPOA: daughter/Misty Wenn-Maness.   Social Determinants of Health   Financial Resource Strain: Not on file  Food Insecurity: Not on file  Transportation Needs: Not on file  Physical Activity: Not on file  Stress: Not on file  Social Connections: Not on file  Intimate Partner Violence: Not on file    Review  of Systems  Per HPI.  Objective:   Vitals:   04/09/20 1535  BP: 125/76  Pulse: 92  Temp: 97.6 F (36.4 C)  TempSrc: Temporal  SpO2: 97%  Weight: 172 lb (78 kg)  Height: 5\' 1"  (1.549 m)     Physical Exam Vitals reviewed.  Constitutional:      Appearance: She is well-developed and well-nourished.  HENT:     Head: Normocephalic and atraumatic.  Eyes:     Extraocular Movements: EOM normal.     Conjunctiva/sclera: Conjunctivae normal.     Pupils: Pupils are equal, round, and reactive to light.  Neck:     Vascular: No carotid bruit.  Cardiovascular:     Rate and Rhythm: Normal rate and regular rhythm.     Pulses: Intact distal pulses.     Heart sounds: Murmur (4/6 SEM) heard.    Pulmonary:     Effort: Pulmonary effort is normal.     Breath sounds: Normal breath sounds.  Abdominal:     Palpations: Abdomen is soft. There is no pulsatile mass.     Tenderness: There is no abdominal tenderness.  Skin:    General: Skin is warm and dry.  Neurological:     Mental Status: She is alert and oriented to person, place, and time.  Psychiatric:        Mood and Affect: Mood and affect normal.        Behavior: Behavior normal.     Assessment & Plan:  Sierra Carpenter is a 75 y.o. female . Essential hypertension -  Plan: valsartan (DIOVAN) 80 MG tablet  -  Stable, tolerating current regimen. Medications refilled. Labs pending as above.   Chronic bilateral low back pain without sciatica - Plan: meloxicam (MOBIC) 7.5 MG tablet  -Overall stable with intermittent low-dose/half dose meloxicam.  Aware of potential risks.  Continue same.  Hypercholesterolemia  -Tolerating higher dose statin and recent lipids reviewed from cardiology.  Non-rheumatic aortic stenosis  -Asymptomatic, with precautions given if any symptoms given that she is doing so well with degree of AS.  Also recommended she discuss any specific exertion/exercise limitations with her cardiologist.  Hyperkalemia - Plan: Comprehensive metabolic panel  -Mild on previous labs, repeat testing  Hypercalcemia - Plan: Comprehensive metabolic panel  -Previously evaluated by endocrine, thought to be related to non-PTH causes previously.  Repeat testing.  Off calcium supplements at this time.  Off HCTZ.  Urge incontinence - Plan: oxybutynin (DITROPAN) 5 MG tablet  -Stable with twice daily dosing of oxybutynin, continue same.  Meds ordered this encounter  Medications  . meloxicam (MOBIC) 7.5 MG tablet    Sig: Take 1 tablet (7.5 mg total) by mouth daily.    Dispense:  45 tablet    Refill:  1    Can place on hold if needed .  . valsartan (DIOVAN) 80 MG tablet    Sig: Take 1 tablet (80 mg total) by mouth daily.    Dispense:  90 tablet    Refill:  3  . oxybutynin (DITROPAN) 5 MG tablet    Sig: Take 1 tablet (5 mg total) by mouth 2 (two) times daily.    Dispense:  180 tablet    Refill:  3   Patient Instructions    Great meeting you today. No medicines were changes today.  I do recommend discussing exercise and any restrictions with exercise with your cardiologist.  I will check some labs today. Follow up in 6 months, but let me  know if there are questions sooner.  Take care.    If you have lab work done today you will be contacted with your  lab results within the next 2 weeks.  If you have not heard from [place] then please contact us. The fastest way to get your results is to register for My Chart.   IF you received an x-ray today, you will receive an invoice from [facility]. Please contact [facility] at [phone] with questions or concerns regarding your invoice.   IF you received labwork today, you will receive an invoice from [place]. Please contact [facility] at [phone] with questions or concerns regarding your invoice.   Our billing staff will not be able to assist you with questions regarding bills from these companies.  You will be contacted with the lab results as soon as they are available. The fastest way to get your results is to activate your My Chart account. Instructions are located on the last page of this paperwork. If you have not heard from [place] regarding the results in 2 weeks, please contact this office.          Signed, Meredith Staggers, MD [facility]

## 2020-04-09 NOTE — Patient Instructions (Addendum)
  Great meeting you today. No medicines were changes today.  I do recommend discussing exercise and any restrictions with exercise with your cardiologist.  I will check some labs today. Follow up in 6 months, but let me know if there are questions sooner.  Take care.    If you have lab work done today you will be contacted with your lab results within the next 2 weeks.  If you have not heard from [place] then please contact us. The fastest way to get your results is to register for My Chart.   IF you received an x-ray today, you will receive an invoice from [facility]. Please contact [facility] at [phone] with questions or concerns regarding your invoice.   IF you received labwork today, you will receive an invoice from [place]. Please contact [facility] at [phone] with questions or concerns regarding your invoice.   Our billing staff will not be able to assist you with questions regarding bills from these companies.  You will be contacted with the lab results as soon as they are available. The fastest way to get your results is to activate your My Chart account. Instructions are located on the last page of this paperwork. If you have not heard from [place] regarding the results in 2 weeks, please contact this office.

## 2020-04-10 LAB — COMPREHENSIVE METABOLIC PANEL
ALT: 23 IU/L (ref 0–32)
AST: 25 IU/L (ref 0–40)
Albumin/Globulin Ratio: 2.3 — ABNORMAL HIGH (ref 1.2–2.2)
Albumin: 4.6 g/dL (ref 3.7–4.7)
Alkaline Phosphatase: 120 IU/L (ref 44–121)
BUN/Creatinine Ratio: 18 (ref 12–28)
BUN: 20 mg/dL (ref 8–27)
Bilirubin Total: 0.4 mg/dL (ref 0.0–1.2)
CO2: 21 mmol/L (ref 20–29)
Calcium: 10 mg/dL (ref 8.7–10.3)
Chloride: 104 mmol/L (ref 96–106)
Creatinine, Ser: 1.12 mg/dL — ABNORMAL HIGH (ref 0.57–1.00)
GFR calc Af Amer: 57 mL/min/{1.73_m2} — ABNORMAL LOW (ref >59)
GFR calc non Af Amer: 49 mL/min/{1.73_m2} — ABNORMAL LOW (ref >59)
Globulin, Total: 2 g/dL (ref 1.5–4.5)
Glucose: 94 mg/dL (ref 65–99)
Potassium: 4.4 mmol/L (ref 3.5–5.2)
Sodium: 142 mmol/L (ref 134–144)
Total Protein: 6.6 g/dL (ref 6.0–8.5)

## 2020-04-27 ENCOUNTER — Encounter: Payer: Self-pay | Admitting: Family Medicine

## 2020-04-27 ENCOUNTER — Other Ambulatory Visit: Payer: Self-pay | Admitting: Family Medicine

## 2020-04-27 DIAGNOSIS — Z1231 Encounter for screening mammogram for malignant neoplasm of breast: Secondary | ICD-10-CM

## 2020-05-18 ENCOUNTER — Other Ambulatory Visit: Payer: Self-pay | Admitting: Family Medicine

## 2020-05-18 DIAGNOSIS — N3941 Urge incontinence: Secondary | ICD-10-CM

## 2020-06-01 ENCOUNTER — Other Ambulatory Visit: Payer: Self-pay | Admitting: Cardiovascular Disease

## 2020-06-30 ENCOUNTER — Other Ambulatory Visit: Payer: Self-pay

## 2020-06-30 ENCOUNTER — Ambulatory Visit (INDEPENDENT_AMBULATORY_CARE_PROVIDER_SITE_OTHER): Payer: Medicare Other | Admitting: Nurse Practitioner

## 2020-06-30 ENCOUNTER — Encounter: Payer: Self-pay | Admitting: Nurse Practitioner

## 2020-06-30 VITALS — BP 135/70 | HR 69 | Temp 99.3°F | Ht 61.0 in | Wt 170.7 lb

## 2020-06-30 DIAGNOSIS — I10 Essential (primary) hypertension: Secondary | ICD-10-CM | POA: Diagnosis not present

## 2020-06-30 DIAGNOSIS — Z7689 Persons encountering health services in other specified circumstances: Secondary | ICD-10-CM

## 2020-06-30 DIAGNOSIS — I2510 Atherosclerotic heart disease of native coronary artery without angina pectoris: Secondary | ICD-10-CM

## 2020-06-30 NOTE — Progress Notes (Signed)
New Patient Office Visit  Subjective:  Patient ID: Sierra Carpenter, female    DOB: 1945-04-09  Age: 75 y.o. MRN: [id number]  CC:  Chief Complaint  Patient presents with  . New Patient (Initial Visit)    HPI Ellizabeth J Carpenter presents to establish new primary care provider. She states that she routinely sees cardiology due to murmur she has had for sseveral years. She states that she had a "change" in her last echocardiogram and is due to have a six month follow up echo. She denies chest pain, chest pressure, shortness of breath, or palpitations. She does have a strong family history of severe heart issues. She states that she has even lost her son due to cardiovascular disease. Her blood pressure is well managed. Her routine labs and annual wellness visits are all up-to-date. Her mammogram is scheduled for August 2022.  She states that in last few months she has lost about 20 pounds. She has been limiting her calorie intake and increase her regular physical activity. She wold like to lose about 10 more pounds prior to [place]. She walks in her neighborhood a few nights per week. She also ses exercise bike frequently. If unable to walk outside, she will walk on her treadmill. She states that she does have some chronic osteoarthritis. She takes meloxicam on as needed basis to reduce pain and inflammation. States she may need refills for this.   Past Medical History:  Diagnosis Date  . Aortic valve sclerosis   . Coronary atherosclerosis    without significant obstruction  . Heart murmur   . History of nuclear stress test 07/12/2010   bruce protocol myoview; normal pattern of perfusion, low risk, post-stress EF 92%  . Hyperlipidemia   . Hypertension     Past Surgical History:  Procedure Laterality Date  . ABDOMINAL HYSTERECTOMY     fibroids; ovaries intact.  . APPENDECTOMY    . CARDIAC CATHETERIZATION     moderate stenosis in 1st diagonal & RCA  . CESAREAN SECTION    . CHOLECYSTECTOMY    .  EYE SURGERY    . TRANSTHORACIC ECHOCARDIOGRAM  07/12/2010   EF=>55% with normal systolic function; trace MR/TR/PR    Family History  Problem Relation Age of Onset  . Heart disease Father 71       AMI  . Heart disease Brother   . Heart disease Son   . Heart disease Paternal Grandfather   . Alzheimer's disease Mother   . Heart disease Brother        CHF/CAD  . COPD Brother   . Heart disease Brother   . Breast cancer Neg Hx     Social History   Socioeconomic History  . Marital status: Widowed    Spouse name: Not on file  . Number of children: 1  . Years of education: 62  . Highest education level: Not on file  Occupational History  . Occupation: retired    Comment: Production designer, theatre/television/film  Tobacco Use  . Smoking status: Never Smoker  . Smokeless tobacco: Never Used  Substance and Sexual Activity  . Alcohol use: No  . Drug use: No  . Sexual activity: Not Currently    Birth control/protection: Post-menopausal, Surgical  Other Topics Concern  . Not on file  Social History Narrative   Marital status: widowed since 2014 due to COPD; married x 37 years.      Children: 1 daughter; 1 son died of CHF age 83 pacemaker.  4 grandchildren.      Lives: alone      Employment: retired at age 61 from [employer].      Tobacco:  never       Alcohol:  never      Exercise: yes in 2019; walking 1-2 miles daily.  DDD lumbar limiting walking.      ADLs: independent with ADLs; drives.        Advanced Directives: none; has paper.  FULL CODE; no prolonged measures.  HCPOA: daughter/Misty Wenn-Maness.   Social Determinants of Health   Financial Resource Strain: Not on file  Food Insecurity: Not on file  Transportation Needs: Not on file  Physical Activity: Not on file  Stress: Not on file  Social Connections: Not on file  Intimate Partner Violence: Not on file    ROS Review of Systems  Constitutional: Negative for activity change, chills and fever.       Has had a 20 pound weight loss  over the past few months.   HENT: Negative for congestion, postnasal drip, rhinorrhea, sinus pressure and sinus pain.   Eyes: Negative.   Respiratory: Negative for apnea, cough and wheezing.   Cardiovascular: Negative for chest pain and palpitations.       Heart murmur. Is followed routinely by cardiology.   Gastrointestinal: Negative for constipation, nausea and vomiting.  Endocrine: Negative.   Genitourinary: Negative.   Musculoskeletal: Positive for arthralgias. Negative for back pain and myalgias.       Generalized osteoarthritis.   Skin: Negative for rash.  Allergic/Immunologic: Positive for environmental allergies.  Neurological: Negative for dizziness, weakness and headaches.  Hematological: Negative for adenopathy.  Psychiatric/Behavioral: The patient is not nervous/anxious.   All other systems reviewed and are negative.   Objective:   Today's Vitals   06/30/20 1035  BP: 135/70  Pulse: 69  Temp: 99.3 F (37.4 C)  SpO2: 97%  Weight: 170 lb 11.2 oz (77.4 kg)  Height: 5\' 1"  (1.549 m)   Body mass index is 32.25 kg/m.   Physical Exam Vitals and nursing note reviewed.  Constitutional:      Appearance: Normal appearance. She is well-developed.  HENT:     Head: Normocephalic and atraumatic.     Nose: Nose normal.  Eyes:     Pupils: Pupils are equal, round, and reactive to light.  Neck:     Vascular: No carotid bruit.  Cardiovascular:     Rate and Rhythm: Normal rate and regular rhythm.     Pulses: Normal pulses.     Heart sounds: Murmur heard.    Pulmonary:     Effort: Pulmonary effort is normal.     Breath sounds: Normal breath sounds.  Abdominal:     Palpations: Abdomen is soft.  Musculoskeletal:        General: Tenderness present. Normal range of motion.     Cervical back: Normal range of motion and neck supple.     Comments: Generalized osteoarthritis without point tenderness present.   Skin:    General: Skin is warm and dry.  Neurological:      General: No focal deficit present.     Mental Status: She is alert and oriented to person, place, and time.  Psychiatric:        Mood and Affect: Mood normal.        Behavior: Behavior normal.        Thought Content: Thought content normal.        Judgment: Judgment normal.  Assessment & Plan:  1. Encounter to establish care Patient appointment to establish new primary care provider.   2. Essential hypertension Stable. Continue bp medication as prescribed   3. Atherosclerosis of native coronary artery of native heart without angina pectoris Patient has moderate systolic murmur. Continue atorvastatin and BP medication as prescribed. Regular visits with cardiology as scheduled.   Problem List Items Addressed This Visit      Cardiovascular and Mediastinum   Essential hypertension   Coronary atherosclerosis    Other Visit Diagnoses    Encounter to establish care    -  Primary      Outpatient Encounter Medications as of 06/30/2020  Medication Sig  . Ascorbic Acid (VITAMIN C PO) Take by mouth daily.  Marland Kitchen aspirin 81 MG tablet Take 1 tablet (81 mg total) by mouth daily.  Marland Kitchen atorvastatin (LIPITOR) 80 MG tablet Take 1 tablet by mouth once daily  . Cholecalciferol (VITAMIN D3 PO) Take by mouth.  . co-enzyme Q-10 30 MG capsule Take 100 mg by mouth 2 (two) times daily.  . fish oil-omega-3 fatty acids 1000 MG capsule Take 1 capsule by mouth 3 (three) times daily.  Marland Kitchen oxybutynin (DITROPAN) 5 MG tablet Take 1 tablet (5 mg total) by mouth 2 (two) times daily.  . valsartan (DIOVAN) 80 MG tablet Take 1 tablet (80 mg total) by mouth daily.  . [DISCONTINUED] meloxicam (MOBIC) 7.5 MG tablet Take 1 tablet (7.5 mg total) by mouth daily.  Dema Severin Petrolatum-Mineral Oil (SYSTANE NIGHTTIME OP) Apply to eye.  . [DISCONTINUED] Multiple Vitamins-Minerals (RA VISION-VITE PRESERVE PO) Take by mouth.   No facility-administered encounter medications on file as of 06/30/2020.   Time spent with the patient  was approximately 30 minutes. This time included reviewing progress notes, labs, imaging studies, and discussing plan for follow up.   Follow-up: Return in about 3 months (around 09/30/2020) for [place] - fasting blood work about a week before.   Ronnell Freshwater, NP

## 2020-06-30 NOTE — Patient Instructions (Signed)
Heart Murmur A heart murmur is an extra sound that is caused by chaotic blood flow through the valves of the heart. The murmur can be heard as a "hum" or "whoosh" sound when blood flows through the heart. There are two types of heart murmurs:  Innocent (benign) murmurs. Most people with this type of heart murmur do not have a heart problem. Many children have innocent heart murmurs. Your health care provider may suggest some basic tests to find out whether your murmur is an innocent murmur. If an innocent heart murmur is found, there is no need for further tests or treatment and no need to restrict activities or stop playing sports.  Abnormal murmurs. These types of murmurs can occur in children and adults. Abnormal murmurs may be a sign of a more serious heart condition, such as a heart defect present at birth (congenital defect) or heart valve disease. What are the causes? The heart has four areas called chambers. Valves separate the upper and lower chambers from each other (tricuspid valve and mitral valve) and separate the lower chambers of the heart from pathways that lead away from the heart (aortic valve and pulmonary valve). Normally, the valves open to let blood flow through or out of your heart, and then they shut to keep the blood from flowing backward. This condition is caused by heart valves that are not working properly.  In children, abnormal heart murmurs are typically caused by congenital defects.  In adults, abnormal murmurs are usually caused by heart valve problems from disease, infection, or aging. This condition may also be caused by:  Pregnancy.  Fever.  Overactive thyroid gland.  Anemia.  Exercise.  Rapid growth spurts (in children).   What are the signs or symptoms? Innocent murmurs do not cause symptoms, and many people with abnormal murmurs may not have symptoms. If symptoms do develop, they may include:  Shortness of breath.  Blue coloring of the skin,  especially on the fingertips.  Chest pain.  Palpitations, or feeling a fluttering or skipped heartbeat.  Fainting.  Persistent cough.  Getting tired much faster than expected.  Swelling in the abdomen, feet, or ankles. How is this diagnosed? This condition may be diagnosed during a routine physical or other exam. If your health care provider hears a murmur with a stethoscope, he or she will listen for:  Where the murmur is located in your heart.  How long the murmur lasts (duration).  When the murmur is heard during the heartbeat.  How loud the murmur is. This may help the health care provider figure out what is causing the murmur. You may be referred to a heart specialist (cardiologist). You may also have other tests, including:  Electrocardiogram (ECG or EKG). This test measures the electrical activity of your heart.  Echocardiogram. This test uses high frequency sound waves to make pictures of your heart.  MRI or chest X-ray.  Cardiac catheterization. This test looks at blood flow through the arteries around the heart. For children and adults who have an abnormal heart murmur and want to stay active, it is important to:  Complete testing.  Review test results.  Receive recommendations from your health care provider. If heart disease is present, it may not be safe to play or be active. How is this treated? Heart murmurs themselves do not need treatment. In some cases, a heart murmur may go away on its own. If an underlying problem or disease is causing the murmur, you may need treatment. If  treatment is needed, it will depend on the type and severity of the disease or heart problem causing the murmur. Treatment may include:  Medicine.  Surgery.  Dietary and lifestyle changes. Follow these instructions at home:  Talk with your health care provider before participating in sports or other activities that require a lot of effort and energy (are strenuous).  Learn as  much as possible about your condition and any related diseases. Ask your health care provider if you may be at risk for any medical emergencies.  Talk with your health care provider about what symptoms you should look out for.  It is up to you to get your test results. Ask your health care provider, or the department that is doing the test, when your results will be ready.  Keep all follow-up visits as told by your health care provider. This is important. Contact a health care provider if:  You are frequently short of breath.  You feel more tired than usual.  You are having a hard time keeping up with normal activities or fitness routines.  You have swelling in your ankles or feet.  You notice that your heart often beats irregularly.  You develop any new symptoms. Get help right away if:  You have chest pain.  You are having trouble breathing.  You feel light-headed or you pass out.  Your symptoms suddenly get worse. These symptoms may represent a serious problem that is an emergency. Do not wait to see if the symptoms will go away. Get medical help right away. Call your local emergency services (911 in the [place] Do not drive yourself to the hospital. Summary  Normally, the heart valves open to let blood flow through or out of your heart, and then they shut to keep the blood from flowing backward.  A heart murmur is caused by heart valves that are not working properly.  You may need treatment if an underlying problem or disease is causing the heart murmur. Treatment may include medicine, surgery, or dietary and lifestyle changes.  Talk with your health care provider before participating in sports or other activities that require a lot of effort and energy (are strenuous).  Talk with your health care provider about what symptoms you should watch out for. This information is not intended to replace advice given to you by your health care provider. Make sure you discuss any  questions you have with your health care provider. Document Revised: 09/12/2017 Document Reviewed: 09/12/2017 Elsevier Patient Education  [place].

## 2020-07-01 ENCOUNTER — Other Ambulatory Visit: Payer: Self-pay | Admitting: Family Medicine

## 2020-07-01 DIAGNOSIS — M545 Low back pain, unspecified: Secondary | ICD-10-CM

## 2020-07-01 DIAGNOSIS — G8929 Other chronic pain: Secondary | ICD-10-CM

## 2020-07-01 NOTE — Telephone Encounter (Signed)
It appears Ms. Grabel is now under the care of Leretha Pol, NP.  I will refill meloxicam temporarily, but future refills should come from her new primary care provider.  Thanks.

## 2020-08-19 ENCOUNTER — Telehealth: Payer: Self-pay | Admitting: Nurse Practitioner

## 2020-08-19 NOTE — Telephone Encounter (Signed)
Thank you. She is also scheduled to have echo in two weeks and then blood work. Will get referrals and everything at visit for [place].

## 2020-08-19 NOTE — Telephone Encounter (Signed)
NP w/[facility] called to advise:  She did a quantifiable study, impairment of circulation in legs/ circulation has mild impairment, feet get cold quickly, this could give a false reading. No symptoms of pain or discoloration of legs

## 2020-09-09 ENCOUNTER — Ambulatory Visit (HOSPITAL_COMMUNITY): Payer: Medicare Other | Attending: Cardiovascular Disease

## 2020-09-09 ENCOUNTER — Other Ambulatory Visit: Payer: Self-pay

## 2020-09-09 DIAGNOSIS — I350 Nonrheumatic aortic (valve) stenosis: Secondary | ICD-10-CM | POA: Diagnosis present

## 2020-09-09 LAB — ECHOCARDIOGRAM COMPLETE
AR max vel: 0.66 cm2
AV Area VTI: 0.67 cm2
AV Area mean vel: 0.75 cm2
AV Mean grad: 42.5 mmHg
AV Peak grad: 83.5 mmHg
Ao pk vel: 4.57 m/s
Area-P 1/2: 2.54 cm2
P 1/2 time: 906 msec
S' Lateral: 1.6 cm

## 2020-09-14 NOTE — Telephone Encounter (Signed)
This RN saw the following documented in Dr. Victorino December 03/12/2020 office note:  AS: Her exam and echo are now consistent with severe AS. Anticipate that she will become symptomatic in the next 6 months and have discussed future workup and plan for TAVR vs. SAVR based mostly on whether there has been CAD progression. 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  I reminded her to promptly report exertional angina, exertional dyspnea or exertional syncope.  F/u sooner, in 6 months.   This RN called patient, asked patient if she has been having any symptoms. Pt reports she has had "occasional chest tightness" and occasional shortness of breath but per patient "not enough to call you". She reports mostly she has just felt "not up to par". This RN told patient if she experiences active chest pain/shortness of breath, syncope, or any other concerning symptoms she should call 911 or have someone take her to the emergency room. Patient verbalized understanding.   This RN scheduled patient to see Laurann Montana, NP on October 07, 2020 to follow up about possible TAVR and heart cath referenced by Dr. Sallyanne Kuster in his note. Patient verbalized understanding. This RN also to route patient's concerns to Dr. Sallyanne Kuster and his primary nurse for review.

## 2020-09-22 ENCOUNTER — Other Ambulatory Visit: Payer: Self-pay

## 2020-09-22 DIAGNOSIS — I10 Essential (primary) hypertension: Secondary | ICD-10-CM

## 2020-09-22 DIAGNOSIS — Z131 Encounter for screening for diabetes mellitus: Secondary | ICD-10-CM

## 2020-09-22 DIAGNOSIS — E7800 Pure hypercholesterolemia, unspecified: Secondary | ICD-10-CM

## 2020-09-22 DIAGNOSIS — Z1329 Encounter for screening for other suspected endocrine disorder: Secondary | ICD-10-CM

## 2020-09-22 DIAGNOSIS — E559 Vitamin D deficiency, unspecified: Secondary | ICD-10-CM

## 2020-09-23 ENCOUNTER — Other Ambulatory Visit: Payer: Medicare Other

## 2020-09-23 ENCOUNTER — Other Ambulatory Visit: Payer: Self-pay

## 2020-09-23 DIAGNOSIS — I10 Essential (primary) hypertension: Secondary | ICD-10-CM

## 2020-09-23 DIAGNOSIS — Z1329 Encounter for screening for other suspected endocrine disorder: Secondary | ICD-10-CM

## 2020-09-23 DIAGNOSIS — Z131 Encounter for screening for diabetes mellitus: Secondary | ICD-10-CM

## 2020-09-23 DIAGNOSIS — E559 Vitamin D deficiency, unspecified: Secondary | ICD-10-CM

## 2020-09-23 DIAGNOSIS — E7800 Pure hypercholesterolemia, unspecified: Secondary | ICD-10-CM

## 2020-09-24 LAB — LIPID PANEL
Chol/HDL Ratio: 3.3 ratio (ref 0.0–4.4)
Cholesterol, Total: 137 mg/dL (ref 100–199)
HDL: 41 mg/dL (ref >39)
LDL Chol Calc (NIH): 76 mg/dL (ref 0–99)
Triglycerides: 112 mg/dL (ref 0–149)
VLDL Cholesterol Cal: 20 mg/dL (ref 5–40)

## 2020-09-24 LAB — COMPREHENSIVE METABOLIC PANEL
ALT: 17 IU/L (ref 0–32)
AST: 17 IU/L (ref 0–40)
Albumin/Globulin Ratio: 2.2 (ref 1.2–2.2)
Albumin: 4.7 g/dL (ref 3.7–4.7)
Alkaline Phosphatase: 133 IU/L — ABNORMAL HIGH (ref 44–121)
BUN/Creatinine Ratio: 13 (ref 12–28)
BUN: 14 mg/dL (ref 8–27)
Bilirubin Total: 0.5 mg/dL (ref 0.0–1.2)
CO2: 26 mmol/L (ref 20–29)
Calcium: 10.2 mg/dL (ref 8.7–10.3)
Chloride: 103 mmol/L (ref 96–106)
Creatinine, Ser: 1.06 mg/dL — ABNORMAL HIGH (ref 0.57–1.00)
Globulin, Total: 2.1 g/dL (ref 1.5–4.5)
Glucose: 95 mg/dL (ref 65–99)
Potassium: 5 mmol/L (ref 3.5–5.2)
Sodium: 143 mmol/L (ref 134–144)
Total Protein: 6.8 g/dL (ref 6.0–8.5)
eGFR: 56 mL/min/{1.73_m2} — ABNORMAL LOW (ref >59)

## 2020-09-24 LAB — CBC
Hematocrit: 48.8 % — ABNORMAL HIGH (ref 34.0–46.6)
Hemoglobin: 16.8 g/dL — ABNORMAL HIGH (ref 11.1–15.9)
MCH: 31.5 pg (ref 26.6–33.0)
MCHC: 34.4 g/dL (ref 31.5–35.7)
MCV: 92 fL (ref 79–97)
Platelets: 299 10*3/uL (ref 150–450)
RBC: 5.33 x10E6/uL — ABNORMAL HIGH (ref 3.77–5.28)
RDW: 12.7 % (ref 11.7–15.4)
WBC: 12.6 10*3/uL — ABNORMAL HIGH (ref 3.4–10.8)

## 2020-09-24 LAB — TSH: TSH: 2.67 u[IU]/mL (ref 0.450–4.500)

## 2020-09-24 LAB — VITAMIN D 25 HYDROXY (VIT D DEFICIENCY, FRACTURES): Vit D, 25-Hydroxy: 38 ng/mL (ref 30.0–100.0)

## 2020-09-24 LAB — HEMOGLOBIN A1C
Est. average glucose Bld gHb Est-mCnc: 111 mg/dL
Hgb A1c MFr Bld: 5.5 % (ref 4.8–5.6)

## 2020-09-24 NOTE — Progress Notes (Signed)
Labs improved/stable since last check. Discuss with patient at visit 6/29

## 2020-09-30 ENCOUNTER — Encounter: Payer: Self-pay | Admitting: Nurse Practitioner

## 2020-09-30 ENCOUNTER — Other Ambulatory Visit: Payer: Self-pay

## 2020-09-30 ENCOUNTER — Ambulatory Visit (INDEPENDENT_AMBULATORY_CARE_PROVIDER_SITE_OTHER): Payer: Medicare Other | Admitting: Nurse Practitioner

## 2020-09-30 VITALS — BP 132/77 | HR 79 | Temp 97.2°F | Ht 61.0 in | Wt 165.6 lb

## 2020-09-30 DIAGNOSIS — I10 Essential (primary) hypertension: Secondary | ICD-10-CM | POA: Diagnosis not present

## 2020-09-30 DIAGNOSIS — Z0000 Encounter for general adult medical examination without abnormal findings: Secondary | ICD-10-CM

## 2020-09-30 DIAGNOSIS — I2510 Atherosclerotic heart disease of native coronary artery without angina pectoris: Secondary | ICD-10-CM

## 2020-09-30 DIAGNOSIS — Z1211 Encounter for screening for malignant neoplasm of colon: Secondary | ICD-10-CM

## 2020-09-30 DIAGNOSIS — R7301 Impaired fasting glucose: Secondary | ICD-10-CM

## 2020-09-30 NOTE — Patient Instructions (Signed)
Preventive Care 41 Years and Older, Female Preventive care refers to lifestyle choices and visits with your health care provider that can promote health and wellness. This includes: A yearly physical exam. This is also called an annual wellness visit. Regular dental and eye exams. Immunizations. Screening for certain conditions. Healthy lifestyle choices, such as: Eating a healthy diet. Getting regular exercise. Not using drugs or products that contain nicotine and tobacco. Limiting alcohol use. What can I expect for my preventive care visit? Physical exam Your health care provider will check your: Height and weight. These may be used to calculate your BMI (body mass index). BMI is a measurement that tells if you are at a healthy weight. Heart rate and blood pressure. Body temperature. Skin for abnormal spots. Counseling Your health care provider may ask you questions about your: Past medical problems. Family's medical history. Alcohol, tobacco, and drug use. Emotional well-being. Home life and relationship well-being. Sexual activity. Diet, exercise, and sleep habits. History of falls. Memory and ability to understand (cognition). Work and work Statistician. Pregnancy and menstrual history. Access to firearms. What immunizations do I need?  Vaccines are usually given at various ages, according to a schedule. Your health care provider will recommend vaccines for you based on your age, medicalhistory, and lifestyle or other factors, such as travel or where you work. What tests do I need? Blood tests Lipid and cholesterol levels. These may be checked every 5 years, or more often depending on your overall health. Hepatitis C test. Hepatitis B test. Screening Lung cancer screening. You may have this screening every year starting at age 44 if you have a 30-pack-year history of smoking and currently smoke or have quit within the past 15 years. Colorectal cancer screening. All  adults should have this screening starting at age 39 and continuing until age 65. Your health care provider may recommend screening at age 61 if you are at increased risk. You will have tests every 1-10 years, depending on your results and the type of screening test. Diabetes screening. This is done by checking your blood sugar (glucose) after you have not eaten for a while (fasting). You may have this done every 1-3 years. Mammogram. This may be done every 1-2 years. Talk with your health care provider about how often you should have regular mammograms. Abdominal aortic aneurysm (AAA) screening. You may need this if you are a current or former smoker. BRCA-related cancer screening. This may be done if you have a family history of breast, ovarian, tubal, or peritoneal cancers. Other tests STD (sexually transmitted disease) testing, if you are at risk. Bone density scan. This is done to screen for osteoporosis. You may have this done starting at age 54. Talk with your health care provider about your test results, treatment options,and if necessary, the need for more tests. Follow these instructions at home: Eating and drinking  Eat a diet that includes fresh fruits and vegetables, whole grains, lean protein, and low-fat dairy products. Limit your intake of foods with high amounts of sugar, saturated fats, and salt. Take vitamin and mineral supplements as recommended by your health care provider. Do not drink alcohol if your health care provider tells you not to drink. If you drink alcohol: Limit how much you have to 0-1 drink a day. Be aware of how much alcohol is in your drink. In the [place] one drink equals one 12 oz bottle of beer (355 mL), one 5 oz glass of wine (148 mL), or one 1  oz glass of hard liquor (44 mL).  Lifestyle Take daily care of your teeth and gums. Brush your teeth every morning and night with fluoride toothpaste. Floss one time each day. Stay active. Exercise for at  least 30 minutes 5 or more days each week. Do not use any products that contain nicotine or tobacco, such as cigarettes, e-cigarettes, and chewing tobacco. If you need help quitting, ask your health care provider. Do not use drugs. If you are sexually active, practice safe sex. Use a condom or other form of protection in order to prevent STIs (sexually transmitted infections). Talk with your health care provider about taking a low-dose aspirin or statin. Find healthy ways to cope with stress, such as: Meditation, yoga, or listening to music. Journaling. Talking to a trusted person. Spending time with friends and family. Safety Always wear your seat belt while driving or riding in a vehicle. Do not drive: If you have been drinking alcohol. Do not ride with someone who has been drinking. When you are tired or distracted. While texting. Wear a helmet and other protective equipment during sports activities. If you have firearms in your house, make sure you follow all gun safety procedures. What's next? Visit your health care provider once a year for an annual wellness visit. Ask your health care provider how often you should have your eyes and teeth checked. Stay up to date on all vaccines. This information is not intended to replace advice given to you by your health care provider. Make sure you discuss any questions you have with your healthcare provider. Document Revised: 03/11/2020 Document Reviewed: 03/15/2018 Elsevier Patient Education  2022 [employer].

## 2020-09-30 NOTE — Progress Notes (Signed)
Subjective:   Sierra Carpenter is a 75 y.o. female who presents for Medicare Annual (Subsequent) preventive examination.  It has been noticed that she is prediabetic in the past.  Today her hemoglobin A1c is 5.5.  She had labs done prior to this visit on 09/23/2020.  Her renal functions were mildly elevated with a serum creatinine of 1.06 and a GFR of 56.  These results are improved from prior checks.  Her lipid panel is normal.  Her blood count indicates that her hemoglobin and hematocrit are both elevated however these are stable from prior checks.  White blood cell count is 12.5.  Her other labs were within normal limits.  She is set to see her cardiologist tomorrow.  She states she may need to have a cardiac catheterization.  Recent echocardiogram showed hyperdynamic left ventricle.  Catheterization potentially needed for further evaluation.  The patient states that she feels well.  She denies chest pain, chest pressure, or shortness of breath.  She denies headaches or dizziness. She states that she noticed a tick bite on the right side of her lower back on Sunday night.  States that she was able to remove the tick completely.  Bite is slightly red but scabbed over.  No rash, tenderness, erythema is noted from the bite. She is due for colon cancer screening. She states she is up-to-date with all immunizations that are recommended.  She has received all her immunizations from [facility] [address] in [place].  We will need to request immunization record from [place].  Review of Systems    Review of Systems  Constitutional:  Negative for chills, fever and malaise/fatigue.  HENT:  Negative for congestion and sinus pain.   Eyes: Negative.   Respiratory:  Negative for cough, shortness of breath and wheezing.   Cardiovascular:  Positive for orthopnea and leg swelling. Negative for chest pain and palpitations.       Patient scheduled to see her cardiologist tomorrow.  Gastrointestinal:   Negative for abdominal pain, constipation, diarrhea, heartburn, nausea and vomiting.  Genitourinary:  Negative for dysuria, flank pain and frequency.  Musculoskeletal:  Negative for back pain and myalgias.  Skin:  Negative for itching and rash.       Tick bite noted to right side of lower back.  Slightly itchy without other evidence of infection.  Neurological:  Negative for dizziness, focal weakness, weakness and headaches.  Endo/Heme/Allergies:        Hemoglobin A1c is 5.5 today.  Psychiatric/Behavioral:  Negative for depression. The patient is not nervous/anxious.        Objective:   Physical Exam Vitals and nursing note reviewed.  Constitutional:      Appearance: Normal appearance. She is well-developed. She is obese.  HENT:     Head: Normocephalic and atraumatic.     Right Ear: Ear canal and external ear normal.     Left Ear: Ear canal and external ear normal.     Nose: Nose normal.     Mouth/Throat:     Mouth: Mucous membranes are moist.     Pharynx: Oropharynx is clear.  Eyes:     Extraocular Movements: Extraocular movements intact.     Conjunctiva/sclera: Conjunctivae normal.     Pupils: Pupils are equal, round, and reactive to light.  Neck:     Vascular: Carotid bruit present.  Cardiovascular:     Rate and Rhythm: Normal rate and regular rhythm.     Pulses: Normal pulses.  Heart sounds: Murmur heard.  Pulmonary:     Effort: Pulmonary effort is normal.     Breath sounds: Normal breath sounds.  Abdominal:     General: Bowel sounds are normal.     Palpations: Abdomen is soft.     Tenderness: There is no abdominal tenderness.  Musculoskeletal:        General: Normal range of motion.     Cervical back: Normal range of motion and neck supple.  Lymphadenopathy:     Cervical: No cervical adenopathy.  Skin:    General: Skin is warm and dry.     Capillary Refill: Capillary refill takes less than 2 seconds.     Comments: Small, circular area of redness at the base of  the right lower back.  Mild redness present.  Scabbing present.  Skin intact with no evidence of drainage present at this time.  Neurological:     General: No focal deficit present.     Mental Status: She is alert and oriented to person, place, and time.     Cranial Nerves: No cranial nerve deficit.     Sensory: No sensory deficit.     Motor: No weakness.     Coordination: Coordination normal.  Psychiatric:        Mood and Affect: Mood normal.        Behavior: Behavior normal.        Thought Content: Thought content normal.        Judgment: Judgment normal.    Today's Vitals   09/30/20 0951  BP: 132/77  Pulse: 79  Temp: (!) 97.2 F (36.2 C)  SpO2: 94%  Weight: 165 lb 9.6 oz (75.1 kg)  Height: 5\' 1"  (1.549 m)   Body mass index is 31.29 kg/m.  Advanced Directives 09/09/2019 08/12/2017 06/01/2016 06/01/2015 12/18/2014  Does Patient Have a Medical Advance Directive? Yes Yes Yes;No No No  Type of Paramedic of [place];Living will Living will - - -  Does patient want to make changes to medical advance directive? No - Patient declined No - Patient declined - - -  Copy of [place] in Chart? No - copy requested - - - -  Would patient like information on creating a medical advance directive? - - - No - patient declined information Yes - Educational materials given    Current Medications (verified) Outpatient Encounter Medications as of 09/30/2020  Medication Sig   Ascorbic Acid (VITAMIN C PO) Take by mouth daily.   aspirin 81 MG tablet Take 1 tablet (81 mg total) by mouth daily.   atorvastatin (LIPITOR) 80 MG tablet Take 1 tablet by mouth once daily   Cholecalciferol (VITAMIN D3 PO) Take by mouth.   co-enzyme Q-10 30 MG capsule Take 100 mg by mouth 2 (two) times daily.   fish oil-omega-3 fatty acids 1000 MG capsule Take 1 capsule by mouth 3 (three) times daily.   meloxicam (MOBIC) 7.5 MG tablet Take 1 tablet by mouth once daily   oxybutynin  (DITROPAN) 5 MG tablet Take 1 tablet (5 mg total) by mouth 2 (two) times daily.   valsartan (DIOVAN) 80 MG tablet Take 1 tablet (80 mg total) by mouth daily.   No facility-administered encounter medications on file as of 09/30/2020.    Allergies (verified) Other and Hydrochlorothiazide   History: Past Medical History:  Diagnosis Date   Aortic valve sclerosis    Coronary atherosclerosis    without significant obstruction   Heart murmur  History of nuclear stress test 07/12/2010   bruce protocol myoview; normal pattern of perfusion, low risk, post-stress EF 92%   Hyperlipidemia    Hypertension    Past Surgical History:  Procedure Laterality Date   ABDOMINAL HYSTERECTOMY     fibroids; ovaries intact.   APPENDECTOMY     CARDIAC CATHETERIZATION     moderate stenosis in 1st diagonal & RCA   CESAREAN SECTION     CHOLECYSTECTOMY     EYE SURGERY     TRANSTHORACIC ECHOCARDIOGRAM  07/12/2010   EF=>55% with normal systolic function; trace MR/TR/PR   Family History  Problem Relation Age of Onset   Heart disease Father 35       AMI   Heart disease Brother    Heart disease Son    Heart disease Paternal Grandfather    Alzheimer's disease Mother    Heart disease Brother        CHF/CAD   COPD Brother    Heart disease Brother    Breast cancer Neg Hx    Social History   Socioeconomic History   Marital status: Widowed    Spouse name: Not on file   Number of children: 1   Years of education: 53   Highest education level: Not on file  Occupational History   Occupation: retired    Comment: Production designer, theatre/television/film  Tobacco Use   Smoking status: Never   Smokeless tobacco: Never  Substance and Sexual Activity   Alcohol use: No   Drug use: No   Sexual activity: Not Currently    Birth control/protection: Post-menopausal, Surgical  Other Topics Concern   Not on file  Social History Narrative   Marital status: widowed since 2014 due to COPD; married x 37 years.      Children: 1  daughter; 1 son died of CHF age 33 pacemaker.  4 grandchildren.      Lives: alone      Employment: retired at age 64 from [employer].      Tobacco:  never       Alcohol:  never      Exercise: yes in 2019; walking 1-2 miles daily.  DDD lumbar limiting walking.      ADLs: independent with ADLs; drives.        Advanced Directives: none; has paper.  FULL CODE; no prolonged measures.  HCPOA: daughter/Misty Wenn-Maness.   Social Determinants of Health   Financial Resource Strain: Not on file  Food Insecurity: Not on file  Transportation Needs: Not on file  Physical Activity: Not on file  Stress: Not on file  Social Connections: Not on file    Tobacco Counseling Patient is non smoker  Diabetic?No     Activities of Daily Living In your present state of health, do you have any difficulty performing the following activities: 09/30/2020 06/30/2020  Hearing? Tempie Donning  Vision? N N  Difficulty concentrating or making decisions? N N  Walking or climbing stairs? N N  Dressing or bathing? N N  Doing errands, shopping? N N  Some recent data might be hidden    Patient Care Team: Ronnell Freshwater, NP as PCP - General (Family Medicine) Croitoru, Dani Gobble, MD as Consulting Physician (Cardiology) Katy Fitch, Darlina Guys, MD as Consulting Physician (Ophthalmology)  Indicate any recent Medical Services you may have received from other than [facility] providers in the past year (date may be approximate).     Assessment:  1. Encounter for Medicare annual wellness exam Annual Medicare wellness  visit today.  2. Essential hypertension Blood pressure stable.  No changes made to medications.  Monitor.  3. Atherosclerosis of native coronary artery of native heart without angina pectoris Patient followed per cardiology closely.  Appointment scheduled for tomorrow.  4. Screening for colon cancer Order for Cologuard sent to [facility] for colon cancer screening.  Will notify patient of results when they  are available. - Cologuard  5. Impaired fasting glucose Recent hemoglobin A1c on 09/23/2020 was 5.5.  Will monitor.       Depression Screen PHQ 2/9 Scores 09/30/2020 06/30/2020 04/09/2020 09/09/2019 03/07/2019 09/06/2018 02/16/2018  PHQ - 2 Score 0 0 0 0 0 0 0  PHQ- 9 Score 0 0 - - - - -    Fall Risk Fall Risk  09/30/2020 06/30/2020 04/09/2020 03/07/2019 09/06/2018  Falls in the past year? 0 0 0 0 0  Number falls in past yr: 0 - 0 0 0  Injury with Fall? 0 - 0 0 0  Risk for fall due to : No Fall Risks - - - -  Follow up Falls evaluation completed Falls evaluation completed Falls evaluation completed - -    FALL RISK PREVENTION PERTAINING TO THE HOME:  Any stairs in or around the home? Yes  If so, are there any without handrails? Yes  Home free of loose throw rugs in walkways, pet beds, electrical cords, etc? Yes  Adequate lighting in your home to reduce risk of falls? Yes   ASSISTIVE DEVICES UTILIZED TO PREVENT FALLS:  Life alert? No  Use of a cane, walker or w/c? No Grab bars in the bathroom? Yes  Shower chair or bench in shower? No  Elevated toilet seat or a handicapped toilet? Yes   TIMED UP AND GO:  Was the test performed? Yes .  Length of time to ambulate 10 feet: 10 sec.   Gait steady and fast without use of assistive device  Cognitive Function:     6CIT Screen 09/30/2020 09/09/2019 09/06/2018  What Year? 0 points 0 points 0 points  What month? 0 points 0 points 0 points  What time? 0 points 0 points 0 points  Count back from 20 0 points 0 points 0 points  Months in reverse 0 points 0 points 0 points  Repeat phrase 0 points 0 points 0 points  Total Score 0 0 0    Immunizations Immunization History  Administered Date(s) Administered   Influenza Split 12/08/2012   Influenza, High Dose Seasonal PF 11/23/2018   Influenza, Quadrivalent, Recombinant, Inj, Pf 11/22/2017   Influenza,inj,Quad PF,6+ Mos 12/18/2014, 11/29/2016   Influenza-Unspecified 12/10/2013, 11/30/2015,  06/01/2016, 11/22/2017, 11/23/2018   PFIZER(Purple Top)SARS-COV-2 Vaccination 05/30/2019, 05/30/2019, 06/25/2019, 06/25/2019, 01/28/2020   Pneumococcal Conjugate-13 12/10/2013   Pneumococcal Polysaccharide-23 12/18/2014   Tdap 08/07/2010    TDAP status: Up to date  Flu Vaccine status: Up to date  Pneumococcal vaccine status: Up to date  Covid-19 vaccine status: Completed vaccines  Qualifies for Shingles Vaccine? Yes   Zostavax completed Yes   Shingrix Completed?: Yes  Screening Tests Health Maintenance  Topic Date Due   Zoster Vaccines- Shingrix (1 of 2) Never done   COVID-19 Vaccine (4 - Booster for Pfizer series) 05/30/2020   TETANUS/TDAP  08/06/2020   COLONOSCOPY (Pts 45-2yrs Insurance coverage will need to be confirmed)  04/09/2021 (Originally 02/05/2020)   INFLUENZA VACCINE  11/02/2020   MAMMOGRAM  11/04/2021   DEXA SCAN  Completed   Hepatitis C Screening  Completed   PNA vac  Low Risk Adult  Completed   HPV VACCINES  Aged Out    Health Maintenance  Health Maintenance Due  Topic Date Due   Zoster Vaccines- Shingrix (1 of 2) Never done   COVID-19 Vaccine (4 - Booster for Pfizer series) 05/30/2020   TETANUS/TDAP  08/06/2020    Colorectal: Pt agreeable to Cologuard  Mammogram status: Ordered 11/05/2020. Pt provided with contact info and advised to call to schedule appt.   Bone Density status: Completed 12/24/2015. Results reflect: Bone density results: NORMAL. Repeat every 0 years.  Lung Cancer Screening: (Low Dose CT Chest recommended if Age 19-80 years, 30 pack-year currently smoking OR have quit w/in 15years.) does not qualify.   Additional Screening:  Hepatitis C Screening: does qualify; Completed UTD  Vision Screening: Recommended annual ophthalmology exams for early detection of glaucoma and other disorders of the eye. Is the patient up to date with their annual eye exam?  Yes  Who is the provider or what is the name of the office in which the patient  attends annual eye exams?  If pt is not established with a provider, would they like to be referred to a provider to establish care? No .   Dental Screening: Recommended annual dental exams for proper oral hygiene  Community Resource Referral / Chronic Care Management: CRR required this visit?  No   CCM required this visit?  No      Plan:     I have personally reviewed and noted the following in the patient's chart:   Medical and social history Use of alcohol, tobacco or illicit drugs  Current medications and supplements including opioid prescriptions.  Functional ability and status Nutritional status Physical activity Advanced directives List of other physicians Hospitalizations, surgeries, and ER visits in previous 12 months Vitals Screenings to include cognitive, depression, and falls Referrals and appointments  In addition, I have reviewed and discussed with patient certain preventive protocols, quality metrics, and best practice recommendations. A written personalized care plan for preventive services as well as general preventive health recommendations were provided to patient.

## 2020-10-01 ENCOUNTER — Telehealth (INDEPENDENT_AMBULATORY_CARE_PROVIDER_SITE_OTHER): Payer: Medicare Other | Admitting: Cardiovascular Disease

## 2020-10-01 ENCOUNTER — Encounter: Payer: Self-pay | Admitting: Cardiovascular Disease

## 2020-10-01 ENCOUNTER — Telehealth: Payer: Self-pay | Admitting: *Deleted

## 2020-10-01 VITALS — BP 120/72 | HR 70 | Ht 61.0 in

## 2020-10-01 DIAGNOSIS — I350 Nonrheumatic aortic (valve) stenosis: Secondary | ICD-10-CM | POA: Diagnosis not present

## 2020-10-01 DIAGNOSIS — E7800 Pure hypercholesterolemia, unspecified: Secondary | ICD-10-CM | POA: Diagnosis not present

## 2020-10-01 DIAGNOSIS — I2510 Atherosclerotic heart disease of native coronary artery without angina pectoris: Secondary | ICD-10-CM | POA: Diagnosis not present

## 2020-10-01 DIAGNOSIS — I10 Essential (primary) hypertension: Secondary | ICD-10-CM

## 2020-10-01 NOTE — H&P (View-Only) (Signed)
Virtual Visit via Telephone Note   This visit type was conducted due to national recommendations for restrictions regarding the COVID-19 Pandemic (e.g. social distancing) in an effort to limit this patient's exposure and mitigate transmission in our community.  Due to her co-morbid illnesses, this patient is at least at moderate risk for complications without adequate follow up.  This format is felt to be most appropriate for this patient at this time.  The patient did not have access to video technology/had technical difficulties with video requiring transitioning to audio format only (telephone).  All issues noted in this document were discussed and addressed.  No physical exam could be performed with this format.  Please refer to the patient's chart for her  consent to telehealth for [facility].    Date:  10/01/2020   ID:  Sierra Carpenter, DOB April 04, 1946, MRN [id number] The patient was identified using 2 identifiers.  Patient Location: Home Provider Location: Home Office   PCP:  Ronnell Freshwater, NP   [facility] Providers Cardiologist:  None     Evaluation Performed:  Follow-Up Visit  Chief Complaint:  Aortic stenosis  History of Present Illness:  Sierra Carpenter is a 75 y.o. female with degenerative aortic stenosis, coronary artery disease, hypertension and hyperlipidemia.  She is recently noticed some degree of exertional dyspnea and weakness/dizziness especially when working out in the yard and very warm weather.  She does not have chest pain, dyspnea, dizziness or syncope during usual activity.  She denies orthopnea, PND or lower extremity edema.   She has not had syncope, palpitations or focal neurological events.  She continues to live independently and typically spends about 3 hours a day out in the garden: raking her own leaves, picking up limbs, doing all her own housework.  Her most recent echocardiogram shows progression of aortic stenosis which is now in critical  range with a peak aortic jet velocity of 4.8 m/second and a mean gradient of 60 mmHg.  In 2000 she had coronary angiography that showed minor coronary artery disease (50% ostial first diagonal artery, 30% proximal RCA).  She had a normal stress test in November 2018.    Her most recent LDL cholesterol checked in December 2021 was very close to target at 72 on atorvastatin maximum dose.  She does not have diabetes mellitus and is not a smoker.  She has well-controlled hypertension on a very low-dose of ARB.Marland Kitchen  Her typical systolic blood pressure at home is around 120.  Past Medical History:  Diagnosis Date   Aortic valve sclerosis    Coronary atherosclerosis    without significant obstruction   Heart murmur    History of nuclear stress test 07/12/2010   bruce protocol myoview; normal pattern of perfusion, low risk, post-stress EF 92%   Hyperlipidemia    Hypertension     Past Surgical History:  Procedure Laterality Date   ABDOMINAL HYSTERECTOMY     fibroids; ovaries intact.   APPENDECTOMY     CARDIAC CATHETERIZATION     moderate stenosis in 1st diagonal & RCA   CESAREAN SECTION     CHOLECYSTECTOMY     EYE SURGERY     TRANSTHORACIC ECHOCARDIOGRAM  07/12/2010   EF=>55% with normal systolic function; trace MR/TR/PR    Current Medications: Outpatient Medications Prior to Visit  Medication Sig Dispense Refill   Ascorbic Acid (VITAMIN C PO) Take by mouth daily.     aspirin 81 MG tablet Take 1 tablet (81  mg total) by mouth daily. 1 tablet    atorvastatin (LIPITOR) 80 MG tablet Take 1 tablet by mouth once daily 30 tablet 11   Cholecalciferol (VITAMIN D3 PO) Take by mouth.     co-enzyme Q-10 30 MG capsule Take 100 mg by mouth 2 (two) times daily.     fish oil-omega-3 fatty acids 1000 MG capsule Take 1 capsule by mouth 3 (three) times daily.     meloxicam (MOBIC) 7.5 MG tablet Take 1 tablet by mouth once daily 45 tablet 0   oxybutynin (DITROPAN) 5 MG tablet Take 1 tablet (5 mg total) by  mouth 2 (two) times daily. 180 tablet 3   valsartan (DIOVAN) 80 MG tablet Take 1 tablet (80 mg total) by mouth daily. 90 tablet 3   No facility-administered medications prior to visit.     Allergies:   Other and Hydrochlorothiazide   Social History   Socioeconomic History   Marital status: Widowed    Spouse name: Not on file   Number of children: 1   Years of education: 19   Highest education level: Not on file  Occupational History   Occupation: retired    Comment: Production designer, theatre/television/film  Tobacco Use   Smoking status: Never   Smokeless tobacco: Never  Substance and Sexual Activity   Alcohol use: No   Drug use: No   Sexual activity: Not Currently    Birth control/protection: Post-menopausal, Surgical  Other Topics Concern   Not on file  Social History Narrative   Marital status: widowed since 2014 due to COPD; married x 37 years.      Children: 1 daughter; 1 son died of CHF age 20 pacemaker.  4 grandchildren.      Lives: alone      Employment: retired at age 76 from [employer].      Tobacco:  never       Alcohol:  never      Exercise: yes in 2019; walking 1-2 miles daily.  DDD lumbar limiting walking.      ADLs: independent with ADLs; drives.        Advanced Directives: none; has paper.  FULL CODE; no prolonged measures.  HCPOA: daughter/Misty Wenn-Maness.   Social Determinants of Health   Financial Resource Strain: Not on file  Food Insecurity: Not on file  Transportation Needs: Not on file  Physical Activity: Not on file  Stress: Not on file  Social Connections: Not on file     Family History:  The patient's family history includes Alzheimer's disease in her mother; COPD in her brother; Heart disease in her brother, brother, brother, paternal grandfather, and son; Heart disease (age of onset: 23) in her father.   ROS:   Please see the history of present illness.    ROS All other systems are reviewed and are negative.   PHYSICAL EXAM:   VS:  BP 120/72    Pulse 70   Ht 5\' 1"  (1.549 m)   BMI 31.29 kg/m     Unable to examine, vital signs reviewed. Wt Readings from Last 3 Encounters:  09/30/20 165 lb 9.6 oz (75.1 kg)  06/30/20 170 lb 11.2 oz (77.4 kg)  04/09/20 172 lb (78 kg)      Studies/Labs Reviewed:   ECHO Oct 2021:  1. The aortic valve has an indeterminant number of cusps. There is severe  calcifcation of the aortic valve. There is severe thickening of the aortic  valve. Aortic valve regurgitation is mild. Severe  aortic valve stenosis.  Aortic valve area, by VTI  measures 0.55 cm. Aortic valve mean gradient measures 61.0 mmHg. Aortic  valve Vmax measures 4.78 m/s.   2. Left ventricular ejection fraction, by estimation, is 65 to 70%. The  left ventricle has normal function. The left ventricle has no regional  wall motion abnormalities. There is mild concentric left ventricular  hypertrophy. Left ventricular diastolic  parameters are consistent with Grade I diastolic dysfunction (impaired  relaxation). Elevated left atrial pressure. The E/e' is 20.8.   3. Right ventricular systolic function is normal. The right ventricular  size is normal. There is normal pulmonary artery systolic pressure. The  estimated right ventricular systolic pressure is 8.9 mmHg.   4. The mitral valve is grossly normal. Mild mitral valve regurgitation.  No evidence of mitral stenosis.   5. The inferior vena cava is normal in size with greater than 50%  respiratory variability, suggesting right atrial pressure of 3 mmHg.   Comparison(s): Prior images unable to be directly viewed, comparison made  by report only. Changes from prior study are noted. Aortic stenosis now  severe.   Echocardiogram 09/09/2020   1. Left ventricular ejection fraction, by estimation, is 70 to 75%. The  left ventricle has hyperdynamic function. The left ventricle has no  regional wall motion abnormalities. There is mild concentric left  ventricular hypertrophy. Left  ventricular  diastolic parameters are consistent with Grade I diastolic dysfunction  (impaired relaxation). Elevated left atrial pressure.   2. Right ventricular systolic function is normal. The right ventricular  size is normal.   3. The mitral valve is normal in structure. Trivial mitral valve  regurgitation. No evidence of mitral stenosis.   4. The aortic valve is tricuspid. There is severe calcification of the  aortic valve. There is severe thickening of the aortic valve. Aortic valve  regurgitation is mild. Severe aortic valve stenosis. Aortic valve area, by  VTI measures 0.67 cm. Aortic  valve mean gradient measures 42.5 mmHg. Aortic valve Vmax measures 4.57  m/s. Aortic valve acceleration time measures 130 msec.   5. The inferior vena cava is normal in size with greater than 50%  respiratory variability, suggesting right atrial pressure of 3 mmHg.   Comparison(s): No significant change from prior study. Prior images  reviewed side by side. Aortic stenosis remains severe.   EKG:  EKG is not ordered today.  Personally reviewed the most recent tracing from 03/12/2020 which shows sinus rhythm left anterior fascicular block. Recent Labs: 09/23/2020: ALT 17; BUN 14; Creatinine, Ser 1.06; Hemoglobin 16.8; Platelets 299; Potassium 5.0; Sodium 143; TSH 2.670   Lipid Panel    Component Value Date/Time   CHOL 137 09/23/2020 0852   TRIG 112 09/23/2020 0852   HDL 41 09/23/2020 0852   CHOLHDL 3.3 09/23/2020 0852   CHOLHDL 3.2 11/18/2015 1111   VLDL 29 11/18/2015 1111   LDLCALC 76 09/23/2020 0852      ASSESSMENT:    1. Non-rheumatic aortic stenosis   2. Atherosclerosis of native coronary artery of native heart without angina pectoris   3. Essential hypertension   4. Hypercholesterolemia       PLAN:  In order of problems listed above:  AS: She is beginning to develop some exertional symptoms of aortic stenosis.  The symptoms are relatively mild, but aortic valve abnormalities  are in critical range.  Recommend that we proceed with work-up for SAVR versus TAVR.  Had a lengthy discussion regarding the purpose, risks and benefits of  cardiac catheterization and coronary angiography as well as the need for CT angiography of the aorta and pelvic arteries.  Scheduled for right and left heart catheterization on Rayette 15 with Dr. Burt Knack.  Hold her valsartan and meloxicam on the day of the catheterization. CAD: Asymptomatic despite active lifestyle and critical aortic stenosis.  Suspect that there has been little progression in her coronary disease and that she would probably end up being a good candidate for TAVR. HTN: Hold valsartan for catheterization. HLP: LDL cholesterol very close to target range on the increased dose of statin.  Continue same medication.      Shared Decision Making/Informed Consent The risks [stroke (1 in 1000), death (1 in 1000), kidney failure [usually temporary] (1 in 500), bleeding (1 in 200), allergic reaction [possibly serious] (1 in 200)], benefits (diagnostic support and management of coronary artery disease) and alternatives of a cardiac catheterization were discussed in detail with Ms. Nest and she is willing to proceed.    COVID-19 Education: The signs and symptoms of COVID-19 were discussed with the patient and how to seek care for testing (follow up with PCP or arrange E-visit).  The importance of social distancing was discussed today.  Time:   Today, I have spent 45 minutes with the patient with telehealth technology discussing the above problems.     Medication Adjustments/Labs and Tests Ordered: Current medicines are reviewed at length with the patient today.  Concerns regarding medicines are outlined above.  Medication changes, Labs and Tests ordered today are listed in the Patient Instructions below. Patient Instructions  Medication Instructions:  No changes *If you need a refill on your cardiac medications before your next  appointment, please call your pharmacy*  Testing/Procedures: Your physician has requested that you have a right and left cardiac catheterization. Cardiac catheterization is used to diagnose and/or treat various heart conditions. Doctors may recommend this procedure for a number of different reasons. The most common reason is to evaluate chest pain. Chest pain can be a symptom of coronary artery disease (CAD), and cardiac catheterization can show whether plaque is narrowing or blocking your heart's arteries. This procedure is also used to evaluate the valves, as well as measure the blood flow and oxygen levels in different parts of your heart.   Follow-Up: At [facility], you and your health needs are our priority.  As part of our continuing mission to provide you with exceptional heart care, we have created designated Provider Care Teams.  These Care Teams include your primary Cardiologist (physician) and Advanced Practice Providers (APPs -  Physician Assistants and Nurse Practitioners) who all work together to provide you with the care you need, when you need it.  We recommend signing up for the patient portal called "MyChart".  Sign up information is provided on this After Visit Summary.  MyChart is used to connect with patients for Virtual Visits (Telemedicine).  Patients are able to view lab/test results, encounter notes, upcoming appointments, etc.  Non-urgent messages can be sent to your provider as well.   To learn more about what you can do with MyChart, go to [email or web address].    Your next appointment:   5 month(s)  The format for your next appointment:   In Person  Provider:   Sanda Klein, MD   Other Instructions    [address] [place] [address] Dept: [phone] Loc: [phone]  Edra J Fronczak  10/01/2020  You are  scheduled for a Cardiac Catheterization on  Wednesday, July 20 with Dr. Sherren Mocha.  1. Please arrive at the [facility] (Main Entrance A) at [facility]: [address] [place], [place] [address] at 9:30 AM (This time is two hours before your procedure to ensure your preparation). Free valet parking service is available.   Special note: Every effort is made to have your procedure done on time. Please understand that emergencies sometimes delay scheduled procedures.  2. Diet: Do not eat solid foods after midnight.  The patient may have clear liquids until 5am upon the day of the procedure.  3. Labs:  Labs were completed on 09/23/20  4. Medication instructions in preparation for your procedure: Nothing to hold  On the morning of your procedure, take your Aspirin and any morning medicines NOT listed above.  You may use sips of water.  5. Plan for one night stay--bring personal belongings. 6. Bring a current list of your medications and current insurance cards. 7. You MUST have a responsible person to drive you home. 8. Someone MUST be with you the first 24 hours after you arrive home or your discharge will be delayed. 9. Please wear clothes that are easy to get on and off and wear slip-on shoes.  Thank you for allowing [place] to care for you!   -- [facility] Invasive Cardiovascular services    Signed, Sanda Klein, MD  10/01/2020 1:56 PM    [address], [place], [place]  [address] Phone: [phone]; Fax: [phone]

## 2020-10-01 NOTE — Progress Notes (Signed)
Virtual Visit via Telephone Note   This visit type was conducted due to national recommendations for restrictions regarding the COVID-19 Pandemic (e.g. social distancing) in an effort to limit this patient's exposure and mitigate transmission in our community.  Due to her co-morbid illnesses, this patient is at least at moderate risk for complications without adequate follow up.  This format is felt to be most appropriate for this patient at this time.  The patient did not have access to video technology/had technical difficulties with video requiring transitioning to audio format only (telephone).  All issues noted in this document were discussed and addressed.  No physical exam could be performed with this format.  Please refer to the patient's chart for her  consent to telehealth for [facility]    Date:  10/01/2020   ID:  Sierra Carpenter, DOB March 14, 1946, MRN [id number] The patient was identified using 2 identifiers.  Patient Location: Home Provider Location: Home Office   PCP:  Ronnell Freshwater, NP   [facility] Providers Cardiologist:  None     Evaluation Performed:  Follow-Up Visit  Chief Complaint:  Aortic stenosis  History of Present Illness:  Sierra Carpenter is a 75 y.o. female with degenerative aortic stenosis, coronary artery disease, hypertension and hyperlipidemia.  She is recently noticed some degree of exertional dyspnea and weakness/dizziness especially when working out in the yard and very warm weather.  She does not have chest pain, dyspnea, dizziness or syncope during usual activity.  She denies orthopnea, PND or lower extremity edema.   She has not had syncope, palpitations or focal neurological events.  She continues to live independently and typically spends about 3 hours a day out in the garden: raking her own leaves, picking up limbs, doing all her own housework.  Her most recent echocardiogram shows progression of aortic stenosis which is now in critical  range with a peak aortic jet velocity of 4.8 m/second and a mean gradient of 60 mmHg.  In 2000 she had coronary angiography that showed minor coronary artery disease (50% ostial first diagonal artery, 30% proximal RCA).  She had a normal stress test in November 2018.    Her most recent LDL cholesterol checked in December 2021 was very close to target at 72 on atorvastatin maximum dose.  She does not have diabetes mellitus and is not a smoker.  She has well-controlled hypertension on a very low-dose of ARB.Marland Kitchen  Her typical systolic blood pressure at home is around 120.  Past Medical History:  Diagnosis Date   Aortic valve sclerosis    Coronary atherosclerosis    without significant obstruction   Heart murmur    History of nuclear stress test 07/12/2010   bruce protocol myoview; normal pattern of perfusion, low risk, post-stress EF 92%   Hyperlipidemia    Hypertension     Past Surgical History:  Procedure Laterality Date   ABDOMINAL HYSTERECTOMY     fibroids; ovaries intact.   APPENDECTOMY     CARDIAC CATHETERIZATION     moderate stenosis in 1st diagonal & RCA   CESAREAN SECTION     CHOLECYSTECTOMY     EYE SURGERY     TRANSTHORACIC ECHOCARDIOGRAM  07/12/2010   EF=>55% with normal systolic function; trace MR/TR/PR    Current Medications: Outpatient Medications Prior to Visit  Medication Sig Dispense Refill   Ascorbic Acid (VITAMIN C PO) Take by mouth daily.     aspirin 81 MG tablet Take 1 tablet (81  mg total) by mouth daily. 1 tablet    atorvastatin (LIPITOR) 80 MG tablet Take 1 tablet by mouth once daily 30 tablet 11   Cholecalciferol (VITAMIN D3 PO) Take by mouth.     co-enzyme Q-10 30 MG capsule Take 100 mg by mouth 2 (two) times daily.     fish oil-omega-3 fatty acids 1000 MG capsule Take 1 capsule by mouth 3 (three) times daily.     meloxicam (MOBIC) 7.5 MG tablet Take 1 tablet by mouth once daily 45 tablet 0   oxybutynin (DITROPAN) 5 MG tablet Take 1 tablet (5 mg total) by  mouth 2 (two) times daily. 180 tablet 3   valsartan (DIOVAN) 80 MG tablet Take 1 tablet (80 mg total) by mouth daily. 90 tablet 3   No facility-administered medications prior to visit.     Allergies:   Other and Hydrochlorothiazide   Social History   Socioeconomic History   Marital status: Widowed    Spouse name: Not on file   Number of children: 1   Years of education: 40   Highest education level: Not on file  Occupational History   Occupation: retired    Comment: Production designer, theatre/television/film  Tobacco Use   Smoking status: Never   Smokeless tobacco: Never  Substance and Sexual Activity   Alcohol use: No   Drug use: No   Sexual activity: Not Currently    Birth control/protection: Post-menopausal, Surgical  Other Topics Concern   Not on file  Social History Narrative   Marital status: widowed since 2014 due to COPD; married x 37 years.      Children: 1 daughter; 1 son died of CHF age 5 pacemaker.  4 grandchildren.      Lives: alone      Employment: retired at age 31 from [employer].      Tobacco:  never       Alcohol:  never      Exercise: yes in 2019; walking 1-2 miles daily.  DDD lumbar limiting walking.      ADLs: independent with ADLs; drives.        Advanced Directives: none; has paper.  FULL CODE; no prolonged measures.  HCPOA: daughter/Misty Wenn-Maness.   Social Determinants of Health   Financial Resource Strain: Not on file  Food Insecurity: Not on file  Transportation Needs: Not on file  Physical Activity: Not on file  Stress: Not on file  Social Connections: Not on file     Family History:  The patient's family history includes Alzheimer's disease in her mother; COPD in her brother; Heart disease in her brother, brother, brother, paternal grandfather, and son; Heart disease (age of onset: 34) in her father.   ROS:   Please see the history of present illness.    ROS All other systems are reviewed and are negative.   PHYSICAL EXAM:   VS:  BP 120/72    Pulse 70   Ht 5\' 1"  (1.549 m)   BMI 31.29 kg/m     Unable to examine, vital signs reviewed. Wt Readings from Last 3 Encounters:  09/30/20 165 lb 9.6 oz (75.1 kg)  06/30/20 170 lb 11.2 oz (77.4 kg)  04/09/20 172 lb (78 kg)      Studies/Labs Reviewed:   ECHO Oct 2021:  1. The aortic valve has an indeterminant number of cusps. There is severe  calcifcation of the aortic valve. There is severe thickening of the aortic  valve. Aortic valve regurgitation is mild. Severe  aortic valve stenosis.  Aortic valve area, by VTI  measures 0.55 cm. Aortic valve mean gradient measures 61.0 mmHg. Aortic  valve Vmax measures 4.78 m/s.   2. Left ventricular ejection fraction, by estimation, is 65 to 70%. The  left ventricle has normal function. The left ventricle has no regional  wall motion abnormalities. There is mild concentric left ventricular  hypertrophy. Left ventricular diastolic  parameters are consistent with Grade I diastolic dysfunction (impaired  relaxation). Elevated left atrial pressure. The E/e' is 20.8.   3. Right ventricular systolic function is normal. The right ventricular  size is normal. There is normal pulmonary artery systolic pressure. The  estimated right ventricular systolic pressure is 8.9 mmHg.   4. The mitral valve is grossly normal. Mild mitral valve regurgitation.  No evidence of mitral stenosis.   5. The inferior vena cava is normal in size with greater than 50%  respiratory variability, suggesting right atrial pressure of 3 mmHg.   Comparison(s): Prior images unable to be directly viewed, comparison made  by report only. Changes from prior study are noted. Aortic stenosis now  severe.   Echocardiogram 09/09/2020   1. Left ventricular ejection fraction, by estimation, is 70 to 75%. The  left ventricle has hyperdynamic function. The left ventricle has no  regional wall motion abnormalities. There is mild concentric left  ventricular hypertrophy. Left  ventricular  diastolic parameters are consistent with Grade I diastolic dysfunction  (impaired relaxation). Elevated left atrial pressure.   2. Right ventricular systolic function is normal. The right ventricular  size is normal.   3. The mitral valve is normal in structure. Trivial mitral valve  regurgitation. No evidence of mitral stenosis.   4. The aortic valve is tricuspid. There is severe calcification of the  aortic valve. There is severe thickening of the aortic valve. Aortic valve  regurgitation is mild. Severe aortic valve stenosis. Aortic valve area, by  VTI measures 0.67 cm. Aortic  valve mean gradient measures 42.5 mmHg. Aortic valve Vmax measures 4.57  m/s. Aortic valve acceleration time measures 130 msec.   5. The inferior vena cava is normal in size with greater than 50%  respiratory variability, suggesting right atrial pressure of 3 mmHg.   Comparison(s): No significant change from prior study. Prior images  reviewed side by side. Aortic stenosis remains severe.   EKG:  EKG is not ordered today.  Personally reviewed the most recent tracing from 03/12/2020 which shows sinus rhythm left anterior fascicular block. Recent Labs: 09/23/2020: ALT 17; BUN 14; Creatinine, Ser 1.06; Hemoglobin 16.8; Platelets 299; Potassium 5.0; Sodium 143; TSH 2.670   Lipid Panel    Component Value Date/Time   CHOL 137 09/23/2020 0852   TRIG 112 09/23/2020 0852   HDL 41 09/23/2020 0852   CHOLHDL 3.3 09/23/2020 0852   CHOLHDL 3.2 11/18/2015 1111   VLDL 29 11/18/2015 1111   LDLCALC 76 09/23/2020 0852      ASSESSMENT:    1. Non-rheumatic aortic stenosis   2. Atherosclerosis of native coronary artery of native heart without angina pectoris   3. Essential hypertension   4. Hypercholesterolemia       PLAN:  In order of problems listed above:  AS: She is beginning to develop some exertional symptoms of aortic stenosis.  The symptoms are relatively mild, but aortic valve abnormalities  are in critical range.  Recommend that we proceed with work-up for SAVR versus TAVR.  Had a lengthy discussion regarding the purpose, risks and benefits of  cardiac catheterization and coronary angiography as well as the need for CT angiography of the aorta and pelvic arteries.  Scheduled for right and left heart catheterization on Kiki 15 with Dr. Burt Knack.  Hold her valsartan and meloxicam on the day of the catheterization. CAD: Asymptomatic despite active lifestyle and critical aortic stenosis.  Suspect that there has been little progression in her coronary disease and that she would probably end up being a good candidate for TAVR. HTN: Hold valsartan for catheterization. HLP: LDL cholesterol very close to target range on the increased dose of statin.  Continue same medication.      Shared Decision Making/Informed Consent The risks [stroke (1 in 1000), death (1 in 1000), kidney failure [usually temporary] (1 in 500), bleeding (1 in 200), allergic reaction [possibly serious] (1 in 200)], benefits (diagnostic support and management of coronary artery disease) and alternatives of a cardiac catheterization were discussed in detail with Ms. Dlugosz and she is willing to proceed.    COVID-19 Education: The signs and symptoms of COVID-19 were discussed with the patient and how to seek care for testing (follow up with PCP or arrange E-visit).  The importance of social distancing was discussed today.  Time:   Today, I have spent 45 minutes with the patient with telehealth technology discussing the above problems.     Medication Adjustments/Labs and Tests Ordered: Current medicines are reviewed at length with the patient today.  Concerns regarding medicines are outlined above.  Medication changes, Labs and Tests ordered today are listed in the Patient Instructions below. Patient Instructions  Medication Instructions:  No changes *If you need a refill on your cardiac medications before your next  appointment, please call your pharmacy*  Testing/Procedures: Your physician has requested that you have a right and left cardiac catheterization. Cardiac catheterization is used to diagnose and/or treat various heart conditions. Doctors may recommend this procedure for a number of different reasons. The most common reason is to evaluate chest pain. Chest pain can be a symptom of coronary artery disease (CAD), and cardiac catheterization can show whether plaque is narrowing or blocking your heart's arteries. This procedure is also used to evaluate the valves, as well as measure the blood flow and oxygen levels in different parts of your heart.   Follow-Up: At [facility], you and your health needs are our priority.  As part of our continuing mission to provide you with exceptional heart care, we have created designated Provider Care Teams.  These Care Teams include your primary Cardiologist (physician) and Advanced Practice Providers (APPs -  Physician Assistants and Nurse Practitioners) who all work together to provide you with the care you need, when you need it.  We recommend signing up for the patient portal called "MyChart".  Sign up information is provided on this After Visit Summary.  MyChart is used to connect with patients for Virtual Visits (Telemedicine).  Patients are able to view lab/test results, encounter notes, upcoming appointments, etc.  Non-urgent messages can be sent to your provider as well.   To learn more about what you can do with MyChart, go to [email or web address].    Your next appointment:   5 month(s)  The format for your next appointment:   In Person  Provider:   Sanda Klein, MD   Other Instructions    [address] [place] [address] Dept: [phone] Loc: [phone]  Sierra Carpenter  10/01/2020  You are  scheduled for a Cardiac Catheterization on  Wednesday, July 20 with Dr. Sherren Mocha.  1. Please arrive at the [facility] (Main Entrance A) at [facility]: [address] [place], [place] [address] at 9:30 AM (This time is two hours before your procedure to ensure your preparation). Free valet parking service is available.   Special note: Every effort is made to have your procedure done on time. Please understand that emergencies sometimes delay scheduled procedures.  2. Diet: Do not eat solid foods after midnight.  The patient may have clear liquids until 5am upon the day of the procedure.  3. Labs:  Labs were completed on 09/23/20  4. Medication instructions in preparation for your procedure: Nothing to hold  On the morning of your procedure, take your Aspirin and any morning medicines NOT listed above.  You may use sips of water.  5. Plan for one night stay--bring personal belongings. 6. Bring a current list of your medications and current insurance cards. 7. You MUST have a responsible person to drive you home. 8. Someone MUST be with you the first 24 hours after you arrive home or your discharge will be delayed. 9. Please wear clothes that are easy to get on and off and wear slip-on shoes.  Thank you for allowing [place] to care for you!   -- [facility] Invasive Cardiovascular services    Signed, Sanda Klein, MD  10/01/2020 1:56 PM    [address], [place], [place]  [address] Phone: [phone]; Fax: [phone]

## 2020-10-01 NOTE — Patient Instructions (Signed)
Medication Instructions:  No changes *If you need a refill on your cardiac medications before your next appointment, please call your pharmacy*  Testing/Procedures: Your physician has requested that you have a right and left cardiac catheterization. Cardiac catheterization is used to diagnose and/or treat various heart conditions. Doctors may recommend this procedure for a number of different reasons. The most common reason is to evaluate chest pain. Chest pain can be a symptom of coronary artery disease (CAD), and cardiac catheterization can show whether plaque is narrowing or blocking your heart's arteries. This procedure is also used to evaluate the valves, as well as measure the blood flow and oxygen levels in different parts of your heart.   Follow-Up: At [facility], you and your health needs are our priority.  As part of our continuing mission to provide you with exceptional heart care, we have created designated Provider Care Teams.  These Care Teams include your primary Cardiologist (physician) and Advanced Practice Providers (APPs -  Physician Assistants and Nurse Practitioners) who all work together to provide you with the care you need, when you need it.  We recommend signing up for the patient portal called "MyChart".  Sign up information is provided on this After Visit Summary.  MyChart is used to connect with patients for Virtual Visits (Telemedicine).  Patients are able to view lab/test results, encounter notes, upcoming appointments, etc.  Non-urgent messages can be sent to your provider as well.   To learn more about what you can do with MyChart, go to [email or web address].    Your next appointment:   5 month(s)  The format for your next appointment:   In Person  Provider:   Sanda Klein, MD   Other Instructions    [address] [place] [address] Dept:  [phone] Loc: [phone]  Miia J Stoffer  10/01/2020  You are scheduled for a Cardiac Catheterization on Wednesday, July 20 with Dr. Sherren Mocha.  1. Please arrive at the [facility] (Main Entrance A) at [facility]: [address] [place], [place] [address] at 9:30 AM (This time is two hours before your procedure to ensure your preparation). Free valet parking service is available.   Special note: Every effort is made to have your procedure done on time. Please understand that emergencies sometimes delay scheduled procedures.  2. Diet: Do not eat solid foods after midnight.  The patient may have clear liquids until 5am upon the day of the procedure.  3. Labs:  Labs were completed on 09/23/20  4. Medication instructions in preparation for your procedure: Nothing to hold  On the morning of your procedure, take your Aspirin and any morning medicines NOT listed above.  You may use sips of water.  5. Plan for one night stay--bring personal belongings. 6. Bring a current list of your medications and current insurance cards. 7. You MUST have a responsible person to drive you home. 8. Someone MUST be with you the first 24 hours after you arrive home or your discharge will be delayed. 9. Please wear clothes that are easy to get on and off and wear slip-on shoes.  Thank you for allowing [place] to care for you!   -- [place] Invasive Cardiovascular services

## 2020-10-01 NOTE — Telephone Encounter (Signed)
Called and gave instructions to the patient for her cath. She will call back if she needs anything further.     [address] [place] [address] Dept: [phone] Loc: [phone]  Sierra Carpenter  10/01/2020  You are scheduled for a Cardiac Catheterization on Wednesday, July 20 with Dr. Sherren Mocha.  1. Please arrive at the [facility] (Main Entrance A) at [facility]: [address] [place], [place] [address] at 9:30 AM (This time is two hours before your procedure to ensure your preparation). Free valet parking service is available.   Special note: Every effort is made to have your procedure done on time. Please understand that emergencies sometimes delay scheduled procedures.  2. Diet: Do not eat solid foods after midnight.  The patient may have clear liquids until 5am upon the day of the procedure.  3. Labs:  Labs were completed on 09/23/20  4. Medication instructions in preparation for your procedure: Nothing to hold  On the morning of your procedure, take your Aspirin and any morning medicines NOT listed above.  You may use sips of water.  5. Plan for one night stay--bring personal belongings. 6. Bring a current list of your medications and current insurance cards. 7. You MUST have a responsible person to drive you home. 8. Someone MUST be with you the first 24 hours after you arrive home or your discharge will be delayed. 9. Please wear clothes that are easy to get on and off and wear slip-on shoes.  Thank you for allowing [place] to care for you!   -- [place] Invasive Cardiovascular services

## 2020-10-02 NOTE — Telephone Encounter (Signed)
Spoke with pt who reports she had received a call from [facility] on her cell phone which she generally does not use.  Advised I am unable to determine who called her today but hopefully they will call her back on her home phone.  Pt was appreciative of the information and c/b.  She had no further questions/concerns at the end of the call.

## 2020-10-02 NOTE — Telephone Encounter (Signed)
Patient ask that our office call on her home phone and not cell phone

## 2020-10-05 DIAGNOSIS — Z1211 Encounter for screening for malignant neoplasm of colon: Secondary | ICD-10-CM | POA: Insufficient documentation

## 2020-10-05 DIAGNOSIS — Z0000 Encounter for general adult medical examination without abnormal findings: Secondary | ICD-10-CM | POA: Insufficient documentation

## 2020-10-05 DIAGNOSIS — R7301 Impaired fasting glucose: Secondary | ICD-10-CM | POA: Insufficient documentation

## 2020-10-05 DIAGNOSIS — Z13820 Encounter for screening for osteoporosis: Secondary | ICD-10-CM | POA: Insufficient documentation

## 2020-10-07 ENCOUNTER — Ambulatory Visit: Payer: Self-pay | Admitting: Family Medicine

## 2020-10-07 ENCOUNTER — Ambulatory Visit (HOSPITAL_BASED_OUTPATIENT_CLINIC_OR_DEPARTMENT_OTHER): Payer: Medicare Other | Admitting: Family

## 2020-10-10 ENCOUNTER — Encounter: Payer: Self-pay | Admitting: Nurse Practitioner

## 2020-10-19 ENCOUNTER — Telehealth: Payer: Self-pay | Admitting: *Deleted

## 2020-10-19 NOTE — Telephone Encounter (Addendum)
Pt contacted pre-catheterization scheduled at [facility] for: Wednesday October 21, 2020 11:30 AM Verified arrival time and place: [place] [facility]) at: 9:30 AM   No solid food after midnight prior to cath, clear liquids until 5 AM day of procedure.  Hold: Valsartan-day before and day of procedure-GFR 56 Mobic-day before and day of procedure-GFR 56  Except hold medications AM meds can be  taken pre-cath with sips of water including: aspirin 81 mg   Confirmed patient has responsible adult to drive home post procedure and be with patient first 24 hours after arriving home: yes  You are allowed ONE visitor in the waiting room during the time you are at the hospital for your procedure. Both you and your visitor must wear a mask once you enter the hospital.   Patient reports does not currently have any symptoms concerning for COVID-19 and no household members with COVID-19 like illness.       Reviewed procedure/mask/visitor instructions with patient.

## 2020-10-21 ENCOUNTER — Encounter: Payer: Self-pay | Admitting: *Deleted

## 2020-10-21 ENCOUNTER — Ambulatory Visit (HOSPITAL_COMMUNITY)
Admission: RE | Admit: 2020-10-21 | Discharge: 2020-10-21 | Disposition: A | Discharge status: Home / Self Care | Payer: Medicare Other | Attending: Cardiovascular Disease | Admitting: Cardiovascular Disease

## 2020-10-21 ENCOUNTER — Other Ambulatory Visit: Payer: Self-pay

## 2020-10-21 ENCOUNTER — Ambulatory Visit (HOSPITAL_COMMUNITY)
Admission: RE | Disposition: A | Discharge status: Home / Self Care | Payer: Self-pay | Source: Home / Self Care | Attending: Cardiovascular Disease

## 2020-10-21 DIAGNOSIS — I2510 Atherosclerotic heart disease of native coronary artery without angina pectoris: Secondary | ICD-10-CM | POA: Insufficient documentation

## 2020-10-21 DIAGNOSIS — I10 Essential (primary) hypertension: Secondary | ICD-10-CM | POA: Insufficient documentation

## 2020-10-21 DIAGNOSIS — I350 Nonrheumatic aortic (valve) stenosis: Secondary | ICD-10-CM

## 2020-10-21 DIAGNOSIS — Z7982 Long term (current) use of aspirin: Secondary | ICD-10-CM | POA: Diagnosis not present

## 2020-10-21 DIAGNOSIS — Z006 Encounter for examination for normal comparison and control in clinical research program: Secondary | ICD-10-CM

## 2020-10-21 DIAGNOSIS — Z79899 Other long term (current) drug therapy: Secondary | ICD-10-CM | POA: Insufficient documentation

## 2020-10-21 DIAGNOSIS — E785 Hyperlipidemia, unspecified: Secondary | ICD-10-CM | POA: Diagnosis not present

## 2020-10-21 DIAGNOSIS — Z888 Allergy status to other drugs, medicaments and biological substances status: Secondary | ICD-10-CM | POA: Diagnosis not present

## 2020-10-21 HISTORY — PX: RIGHT/LEFT HEART CATH AND CORONARY ANGIOGRAPHY: CATH118266

## 2020-10-21 SURGERY — RIGHT/LEFT HEART CATH AND CORONARY ANGIOGRAPHY
Anesthesia: LOCAL

## 2020-10-21 MED ORDER — SODIUM CHLORIDE 0.9 % WEIGHT BASED INFUSION
3.0000 mL/kg/h | INTRAVENOUS | Status: AC
  Administered 2020-10-21: 3 mL/kg/h via INTRAVENOUS

## 2020-10-21 MED ORDER — SODIUM CHLORIDE 0.9 % IV SOLN: 250.0000 mL | INTRAVENOUS | Status: DC | PRN

## 2020-10-21 MED ORDER — LIDOCAINE HCL (PF) 1 % IJ SOLN
INTRAMUSCULAR | Status: AC
  Filled 2020-10-21: qty 30

## 2020-10-21 MED ORDER — ASPIRIN 81 MG PO CHEW: 81.0000 mg | CHEWABLE_TABLET | ORAL | Status: DC

## 2020-10-21 MED ORDER — HYDRALAZINE HCL 20 MG/ML IJ SOLN: 10.0000 mg | INTRAMUSCULAR | Status: DC | PRN

## 2020-10-21 MED ORDER — MIDAZOLAM HCL 2 MG/2ML IJ SOLN
INTRAMUSCULAR | Status: AC
  Filled 2020-10-21: qty 2

## 2020-10-21 MED ORDER — HEPARIN (PORCINE) IN NACL 1000-0.9 UT/500ML-% IV SOLN
INTRAVENOUS | Status: AC
  Filled 2020-10-21: qty 1000

## 2020-10-21 MED ORDER — HEPARIN (PORCINE) IN NACL 1000-0.9 UT/500ML-% IV SOLN
INTRAVENOUS | Status: DC | PRN
  Administered 2020-10-21 (×2): 500 mL

## 2020-10-21 MED ORDER — HEPARIN SODIUM (PORCINE) 1000 UNIT/ML IJ SOLN
INTRAMUSCULAR | Status: DC | PRN
  Administered 2020-10-21: 3500 [IU] via INTRAVENOUS

## 2020-10-21 MED ORDER — HEPARIN SODIUM (PORCINE) 1000 UNIT/ML IJ SOLN
INTRAMUSCULAR | Status: AC
  Filled 2020-10-21: qty 1

## 2020-10-21 MED ORDER — LABETALOL HCL 5 MG/ML IV SOLN: 10.0000 mg | INTRAVENOUS | Status: DC | PRN

## 2020-10-21 MED ORDER — SODIUM CHLORIDE 0.9 % WEIGHT BASED INFUSION: 1.0000 mL/kg/h | INTRAVENOUS | Status: DC

## 2020-10-21 MED ORDER — VERAPAMIL HCL 2.5 MG/ML IV SOLN
INTRAVENOUS | Status: DC | PRN
  Administered 2020-10-21: 10 mL via INTRA_ARTERIAL

## 2020-10-21 MED ORDER — ACETAMINOPHEN 325 MG PO TABS: 650.0000 mg | ORAL_TABLET | ORAL | Status: DC | PRN

## 2020-10-21 MED ORDER — ONDANSETRON HCL 4 MG/2ML IJ SOLN: 4.0000 mg | Freq: Four times a day (QID) | INTRAMUSCULAR | Status: DC | PRN

## 2020-10-21 MED ORDER — LIDOCAINE HCL (PF) 1 % IJ SOLN
INTRAMUSCULAR | Status: DC | PRN
  Administered 2020-10-21 (×2): 2 mL

## 2020-10-21 MED ORDER — SODIUM CHLORIDE 0.9% FLUSH: 3.0000 mL | Freq: Two times a day (BID) | INTRAVENOUS | Status: DC

## 2020-10-21 MED ORDER — SODIUM CHLORIDE 0.9% FLUSH: 3.0000 mL | INTRAVENOUS | Status: DC | PRN

## 2020-10-21 MED ORDER — MIDAZOLAM HCL 2 MG/2ML IJ SOLN
INTRAMUSCULAR | Status: DC | PRN
  Administered 2020-10-21: 2 mg via INTRAVENOUS

## 2020-10-21 MED ORDER — FENTANYL CITRATE (PF) 100 MCG/2ML IJ SOLN
INTRAMUSCULAR | Status: AC
  Filled 2020-10-21: qty 2

## 2020-10-21 MED ORDER — IOHEXOL 350 MG/ML SOLN
INTRAVENOUS | Status: DC | PRN
  Administered 2020-10-21: 55 mL

## 2020-10-21 MED ORDER — FENTANYL CITRATE (PF) 100 MCG/2ML IJ SOLN
INTRAMUSCULAR | Status: DC | PRN
  Administered 2020-10-21: 25 ug via INTRAVENOUS

## 2020-10-21 MED ORDER — VERAPAMIL HCL 2.5 MG/ML IV SOLN
INTRAVENOUS | Status: AC
  Filled 2020-10-21: qty 2

## 2020-10-21 SURGICAL SUPPLY — 14 items
CATH 5FR JL3.5 JR4 ANG PIG MP (CATHETERS) ×2 IMPLANT
CATH BALLN WEDGE 5F 110CM (CATHETERS) ×2 IMPLANT
CATH INFINITI 5FR AL1 (CATHETERS) ×2 IMPLANT
DEVICE RAD COMP TR BAND LRG (VASCULAR PRODUCTS) ×1 IMPLANT
GLIDESHEATH SLEND SS 6F .021 (SHEATH) ×1 IMPLANT
GUIDEWIRE .025 260CM (WIRE) ×2 IMPLANT
GUIDEWIRE INQWIRE 1.5J.035X260 (WIRE) IMPLANT
INQWIRE 1.5J .035X260CM (WIRE) ×2
KIT HEART LEFT (KITS) ×2 IMPLANT
PACK CARDIAC CATHETERIZATION (CUSTOM PROCEDURE TRAY) ×2 IMPLANT
SHEATH GLIDE SLENDER 4/5FR (SHEATH) ×2 IMPLANT
SYR MEDRAD MARK 7 150ML (SYRINGE) ×2 IMPLANT
TRANSDUCER W/STOPCOCK (MISCELLANEOUS) ×2 IMPLANT
TUBING CIL FLEX 10 FLL-RA (TUBING) ×2 IMPLANT

## 2020-10-21 NOTE — Interval H&P Note (Signed)
History and Physical Interval Note:  10/21/2020 11:42 AM  Sierra Carpenter  has presented today for surgery, with the diagnosis of aortic stenosis.  The various methods of treatment have been discussed with the patient and family. After consideration of risks, benefits and other options for treatment, the patient has consented to  Procedure(s): RIGHT/LEFT HEART CATH AND CORONARY ANGIOGRAPHY (N/A) as a surgical intervention.  The patient's history has been reviewed, patient examined, no change in status, stable for surgery.  I have reviewed the patient's chart and labs.  Questions were answered to the patient's satisfaction.     Sherren Mocha

## 2020-10-21 NOTE — Research (Signed)
Identify - [place] Informed Consent   Subject Name: Sierra Carpenter  Subject met inclusion and exclusion criteria.  The informed consent form, study requirements and expectations were reviewed with the subject and questions and concerns were addressed prior to the signing of the consent form.  The subject verbalized understanding of the trial requirements.  The subject agreed to participate in the Identify - PH trial and signed the informed consent at 1016 on 21-Oct-2020.  The informed consent was obtained prior to performance of any protocol-specific procedures for the subject.  A copy of the signed informed consent was given to the subject and a copy was placed in the subject's medical record.   Jasmine Pang, RN BSN [facility] Cardiovascular Research & Education Direct Line: [phone]

## 2020-10-22 ENCOUNTER — Other Ambulatory Visit: Payer: Self-pay

## 2020-10-22 ENCOUNTER — Encounter (HOSPITAL_COMMUNITY): Payer: Self-pay | Admitting: Cardiovascular Disease

## 2020-10-22 DIAGNOSIS — I350 Nonrheumatic aortic (valve) stenosis: Secondary | ICD-10-CM

## 2020-10-22 LAB — POCT I-STAT 7, (LYTES, BLD GAS, ICA,H+H)
Acid-Base Excess: 0 mmol/L (ref 0.0–2.0)
Acid-Base Excess: 1 mmol/L (ref 0.0–2.0)
Bicarbonate: 26.4 mmol/L (ref 20.0–28.0)
Bicarbonate: 27 mmol/L (ref 20.0–28.0)
Calcium, Ion: 1.22 mmol/L (ref 1.15–1.40)
Calcium, Ion: 1.23 mmol/L (ref 1.15–1.40)
HCT: 39 % (ref 36.0–46.0)
HCT: 39 % (ref 36.0–46.0)
Hemoglobin: 13.3 g/dL (ref 12.0–15.0)
Hemoglobin: 13.3 g/dL (ref 12.0–15.0)
O2 Saturation: 100 %
O2 Saturation: 66 %
Potassium: 3.8 mmol/L (ref 3.5–5.1)
Potassium: 3.9 mmol/L (ref 3.5–5.1)
Sodium: 144 mmol/L (ref 135–145)
Sodium: 145 mmol/L (ref 135–145)
TCO2: 28 mmol/L (ref 22–32)
TCO2: 29 mmol/L (ref 22–32)
pCO2 arterial: 46.1 mmHg (ref 32.0–48.0)
pCO2 arterial: 51.5 mmHg — ABNORMAL HIGH (ref 32.0–48.0)
pH, Arterial: 7.327 — ABNORMAL LOW (ref 7.350–7.450)
pH, Arterial: 7.366 (ref 7.350–7.450)
pO2, Arterial: 191 mmHg — ABNORMAL HIGH (ref 83.0–108.0)
pO2, Arterial: 38 mmHg — CL (ref 83.0–108.0)

## 2020-10-22 MED FILL — Lidocaine HCl Local Preservative Free (PF) Inj 1%: INTRAMUSCULAR | Qty: 30 | Status: AC

## 2020-10-27 ENCOUNTER — Encounter (HOSPITAL_COMMUNITY): Payer: Medicare Other

## 2020-10-29 ENCOUNTER — Other Ambulatory Visit (HOSPITAL_COMMUNITY): Payer: Medicare Other

## 2020-10-29 ENCOUNTER — Ambulatory Visit: Payer: Medicare Other | Attending: Nurse Practitioner | Admitting: Physical Therapy

## 2020-10-29 ENCOUNTER — Encounter: Payer: Self-pay | Admitting: Physical Therapy

## 2020-10-29 ENCOUNTER — Ambulatory Visit (HOSPITAL_COMMUNITY): Payer: Medicare Other

## 2020-10-29 ENCOUNTER — Encounter (HOSPITAL_COMMUNITY): Payer: Self-pay

## 2020-10-29 ENCOUNTER — Other Ambulatory Visit: Payer: Self-pay

## 2020-10-29 ENCOUNTER — Ambulatory Visit (HOSPITAL_COMMUNITY)
Admission: RE | Admit: 2020-10-29 | Discharge: 2020-10-29 | Disposition: A | Discharge status: Home / Self Care | Payer: Medicare Other | Source: Ambulatory Visit | Attending: Cardiovascular Disease | Admitting: Cardiovascular Disease

## 2020-10-29 ENCOUNTER — Ambulatory Visit (HOSPITAL_COMMUNITY)
Admit: 2020-10-29 | Discharge: 2020-10-29 | Disposition: A | Discharge status: Home / Self Care | Payer: Medicare Other | Attending: Cardiovascular Disease | Admitting: Cardiovascular Disease

## 2020-10-29 DIAGNOSIS — I350 Nonrheumatic aortic (valve) stenosis: Secondary | ICD-10-CM

## 2020-10-29 DIAGNOSIS — R2689 Other abnormalities of gait and mobility: Secondary | ICD-10-CM | POA: Diagnosis present

## 2020-10-29 LAB — POCT I-STAT CREATININE: Creatinine, Ser: 0.8 mg/dL (ref 0.44–1.00)

## 2020-10-29 MED ORDER — IOHEXOL 350 MG/ML SOLN
100.0000 mL | Freq: Once | INTRAVENOUS | Status: AC | PRN
  Administered 2020-10-29: 100 mL via INTRAVENOUS

## 2020-10-29 NOTE — Progress Notes (Signed)
Carotid artery duplex completed. Refer to "CV Proc" under chart review to view preliminary results.  10/29/2020 9:15 AM Kelby Aline., MHA, RVT, RDCS, RDMS

## 2020-10-29 NOTE — Therapy (Signed)
[place], [place], [address] Phone: [phone]   Fax:  [phone]  Physical Therapy Evaluation  Patient Details  Name: Sierra Carpenter MRN: [id number] Date of Birth: [birth year] Referring Provider (PT): Ronnell Freshwater, NP  Encounter Date: 10/29/2020   PT End of Session - 10/29/20 1334     Visit Number 1    Number of Visits 1    PT Start Time [id number]    PT Stop Time 1320    PT Time Calculation (min) 35 min    Activity Tolerance Patient tolerated treatment well    Behavior During Therapy [facility] for tasks assessed/performed             Past Medical History:  Diagnosis Date   Aortic valve sclerosis    Coronary atherosclerosis    without significant obstruction   Heart murmur    History of nuclear stress test 07/12/2010   bruce protocol myoview; normal pattern of perfusion, low risk, post-stress EF 92%   Hyperlipidemia    Hypertension     Past Surgical History:  Procedure Laterality Date   ABDOMINAL HYSTERECTOMY     fibroids; ovaries intact.   APPENDECTOMY     CARDIAC CATHETERIZATION     moderate stenosis in 1st diagonal & RCA   CESAREAN SECTION     CHOLECYSTECTOMY     EYE SURGERY     RIGHT/LEFT HEART CATH AND CORONARY ANGIOGRAPHY N/A 10/21/2020   Procedure: RIGHT/LEFT HEART CATH AND CORONARY ANGIOGRAPHY;  Surgeon: Sherren Mocha, MD;  Location: [facility];  Service: Cardiovascular;  Laterality: N/A;   TRANSTHORACIC ECHOCARDIOGRAM  07/12/2010   EF=>55% with normal systolic function; trace MR/TR/PR       There were no vitals filed for this visit.    Subjective Assessment - 10/29/20 1333     Subjective Sierra Carpenter is a 75 y.o. female who presents to clinic with chief complaint of SOB and fatigue.  History of condition: "heart murmur" for more than 10 years, increasing fatigue recently.  Pain location: currently denies pain, but does have some chronic back pain. Home environment:  lives alone, has close friends to help, 7 STE with rails, single level house.  Assistive device: none.    Pertinent History SOB d/t aortic stenosis            [facility] Pre-Surgical Assessment:  AROM:   Upper Extremities:  WFL Lower Extremities:  WFL  Strength:  Upper Extremities:  WFL Lower Extremities:  WFL  Resting Vitals:  BP:  138/99 HR:  93 BPM 02 Sat:  95%  RPE:  6 - none Modified Borg scale:  0 - Nothing at all  Grip strength:  R: 12 kg L: 14 kg  4 stage balance: full tandem 4''  5x STS: 10x  5 m walk test (3 trials) and average:    Trial 1:  4 sec  Trial 2:  4 sec  Trial 3:  4 sec  Average:  4 sec  6 min walk test:  1015 feet  Break at 3' 30'' min, 648 ft, rest time 1':   HR:  113 BPM 02 Sat:  94%  RPE:  13 - Somewhat hard Modified Borg scale:  3 - Moderate    Vitals following 6 min walk test:  BP:  147/72 HR:  113 BPM 02 Sat:  94%  RPE:  13 - Somewhat hard Modified Borg scale:  .5 - Very, very slight (just noticeable)  Fraility index:    % diability 34.30%  % of norm 65.70%       Plan:  Stability/Clinical Decision Making: stable/uncomplicated Clinical decision making: low PT frequency: one time visit PT next visit plan: pre-surgery evaluation    [facility] PT Assessment - 10/29/20 0001       Assessment   Medical Diagnosis Critical Aortic Stenosis    Referring Provider (PT) Ronnell Freshwater, NP    Onset Date/Surgical Date 11/04/20    Hand Dominance Right    Next MD Visit 11/04/2020    Prior Therapy none      Balance Screen   Has the patient fallen in the past 6 months No              Objective measurements completed on examination: See above findings.       Plan - 10/29/20 1335     Clinical Impression Statement Pt is a 74 y.o. yo female presenting to OP PT for evaluation prior to possible TAVR surgery due to severe aortic stenosis. Pt reports onset of fatigue and SOB starting 10 years ago and worsening  within the last year. Symptoms are limiting walking and ADLs. Pt presents with WFL ROM and strength, poor balance and is at mod fall risk (according to 4 stage balance test), WNL walking speed and diminished aerobic endurance per 6 minute walk test. Pt ambulated 647 feet in 3' 30'' before requesting a seated rest beak lasting 1'. At time of rest, patient's HR was 113 bpm and O2 was 94 on room air. Pt reported 3/10 shortness of breath on modified scale for dyspnea. Pt able to resume after rest and ambulate an additional 367 feet. Pt ambulated a total of 1015 feet in 6 minute walk. Fatigue increased significantly with 6 minute walk test. Based on the Short Physical Performance Battery, patient has a frailty rating of 11/12 with </= 5/12 considered frail.             Patient will benefit from skilled therapeutic intervention in order to improve the following deficits and impairments:     Visit Diagnosis: Other abnormalities of gait and mobility     Problem List Patient Active Problem List   Diagnosis Date Noted   Encounter for Medicare annual wellness exam 10/05/2020   Screening for colon cancer 10/05/2020   Impaired fasting glucose 10/05/2020   Vitamin D deficiency 09/06/2018   Urge incontinence 08/12/2017   Class 1 obesity due to excess calories with serious comorbidity and body mass index (BMI) of 31.0 to 31.9 in adult 06/13/2016   Paresthesia of lower lip 06/06/2016   Non-rheumatic aortic stenosis 08/28/2015   Polycythemia 06/01/2015   Coronary atherosclerosis 01/01/2013   Essential hypertension 05/09/2011   Hypercholesterolemia 05/09/2011   Colon polyps 05/09/2011    Shearon Balo PT, DPT 10/29/20 1:42 PM  [place] [facility] [address] [place], [place], [address] Phone: [phone]   Fax:  [phone]  Name: Sierra Carpenter MRN: [id number] Date of Birth: Sierra Carpenter 01, 1947

## 2020-11-03 NOTE — Interval H&P Note (Signed)
History and Physical Interval Note:  11/03/2020 7:02 PM  Note added because physical exam not included on other H&P addendum: Pt is alert and oriented, elderly woman in NAD HEENT: normal Neck: JVP - normal, carotids 2+=  Lungs: CTA bilaterally CV: RRR with 3/6 harsh systolic murmur at the RUSB Abd: soft, NT, Positive BS, no hepatomegaly Ext: no C/C/E, distal pulses intact and equal Skin: warm/dry no rash    Sierra Carpenter

## 2020-11-04 ENCOUNTER — Encounter: Payer: Self-pay | Admitting: Surgery

## 2020-11-04 ENCOUNTER — Other Ambulatory Visit: Payer: Self-pay

## 2020-11-04 ENCOUNTER — Institutional Professional Consult (permissible substitution): Payer: Medicare Other | Admitting: Surgery

## 2020-11-04 VITALS — BP 159/85 | HR 88 | Resp 20 | Ht 60.0 in | Wt 164.0 lb

## 2020-11-04 DIAGNOSIS — I350 Nonrheumatic aortic (valve) stenosis: Secondary | ICD-10-CM | POA: Diagnosis not present

## 2020-11-04 NOTE — Progress Notes (Signed)
Patient ID: Sierra Carpenter, female   DOB: June 29, 1945, 75 y.o.   MRN: [id number]  [facility]         [address]       [place] [address]             [facility] REPORT  PCP is Ronnell Freshwater, NP Referring Provider is Sherren Mocha, MD Primary Cardiologist is M. Croitoru, MD  Reason for consultation:  Severe aortic stenosis  HPI:  The patient is a 75 year old woman with a history of hypertension, hyperlipidemia, nonobstructive coronary artery disease by prior catheterization in 2000 with a normal stress test in November 2018, and aortic stenosis who was referred for consideration of transcatheter aortic valve replacement.  She had an echocardiogram on 03/05/2020 showing an indeterminate number of aortic valve cusp.  There is severe calcification and thickening of the aortic valve.  The mean gradient was 61 mmHg with an aortic valve area of 0.55 cm.  Left ventricular ejection fraction was 65 to 70% with grade 1 diastolic dysfunction and mild LVH.  She recently started having exertional shortness of breath and weakness with some dizziness when working in her yard during warm weather.  She does not have any symptoms with normal activity around the house.  She has had some chest pain with exertion.  She denies orthopnea.  She has had no peripheral edema.  Her most recent echo on 09/09/2020 showed a trileaflet aortic valve with severe calcification and thickening.  The aortic valve mean gradient was 42.5 mmHg with a peak gradient of 83.5 mmHg.  Peak velocity was 4.57 m/s.  Aortic valve area was 0.67 cm.  There is mild aortic insufficiency.  Left ventricular ejection fraction was 70 to 75%.  Past Medical History:  Diagnosis Date   Aortic valve sclerosis    Coronary atherosclerosis    without significant obstruction   Heart murmur    History of nuclear stress test 07/12/2010    bruce protocol myoview; normal pattern of perfusion, low risk, post-stress EF 92%   Hyperlipidemia    Hypertension     Past Surgical History:  Procedure Laterality Date   ABDOMINAL HYSTERECTOMY     fibroids; ovaries intact.   APPENDECTOMY     CARDIAC CATHETERIZATION     moderate stenosis in 1st diagonal & RCA   CESAREAN SECTION     CHOLECYSTECTOMY     EYE SURGERY     RIGHT/LEFT HEART CATH AND CORONARY ANGIOGRAPHY N/A 10/21/2020   Procedure: RIGHT/LEFT HEART CATH AND CORONARY ANGIOGRAPHY;  Surgeon: Sherren Mocha, MD;  Location: [facility];  Service: Cardiovascular;  Laterality: N/A;   TRANSTHORACIC ECHOCARDIOGRAM  07/12/2010   EF=>55% with normal systolic function; trace MR/TR/PR    Family History  Problem Relation Age of Onset   Heart disease Father 87       AMI   Heart disease Brother    Heart disease Son    Heart disease Paternal Grandfather    Alzheimer's disease Mother    Heart disease Brother        CHF/CAD   COPD Brother    Heart disease Brother    Breast cancer Neg Hx     Social History   Socioeconomic History   Marital status: Widowed    Spouse name: Not on file   Number of children: 1   Years of  education: 12   Highest education level: Not on file  Occupational History   Occupation: retired    Comment: Production designer, theatre/television/film  Tobacco Use   Smoking status: Never   Smokeless tobacco: Never  Substance and Sexual Activity   Alcohol use: No   Drug use: No   Sexual activity: Not Currently    Birth control/protection: Post-menopausal, Surgical  Other Topics Concern   Not on file  Social History Narrative   Marital status: widowed since 2014 due to COPD; married x 37 years.      Children: 1 daughter; 1 son died of CHF age 58 pacemaker.  4 grandchildren.      Lives: alone      Employment: retired at age 34 from [employer].      Tobacco:  never       Alcohol:  never      Exercise: yes in 2019; walking 1-2 miles daily.  DDD lumbar limiting walking.       ADLs: independent with ADLs; drives.        Advanced Directives: none; has paper.  FULL CODE; no prolonged measures.  HCPOA: daughter/Misty Wenn-Maness.   Social Determinants of Health   Financial Resource Strain: Not on file  Food Insecurity: Not on file  Transportation Needs: Not on file  Physical Activity: Not on file  Stress: Not on file  Social Connections: Not on file  Intimate Partner Violence: Not on file    Prior to Admission medications   Medication Sig Start Date End Date Taking? Authorizing Provider  Ascorbic Acid (VITAMIN C PO) Take 1,000 mg by mouth 2 (two) times daily.   Yes [provider]  aspirin 81 MG tablet Take 1 tablet (81 mg total) by mouth daily. 11/29/16  Yes Wardell Honour, MD  atorvastatin (LIPITOR) 80 MG tablet Take 1 tablet by mouth once daily Patient taking differently: Take 80 mg by mouth daily. 06/02/20  Yes Croitoru, Mihai, MD  Carboxymeth-Glycerin-Polysorb (REFRESH OPTIVE ADVANCED) 0.5-1-0.5 % SOLN Place 1 drop into both eyes every 3 (three) hours as needed (dry eye).   Yes [provider]  Carboxymethylcellul-Glycerin (REFRESH OPTIVE) 1-0.9 % GEL Place 1 drop into both eyes at bedtime.   Yes [provider]  Cholecalciferol (VITAMIN D3) 25 MCG (1000 UT) CAPS Take 1,000 Units by mouth 2 (two) times daily.   Yes [provider]  co-enzyme Q-10 30 MG capsule Take 100 mg by mouth 2 (two) times daily.   Yes [provider]  fish oil-omega-3 fatty acids 1000 MG capsule Take 1,000 mg by mouth 3 (three) times daily.   Yes [provider]  meloxicam (MOBIC) 7.5 MG tablet Take 1 tablet by mouth once daily Patient taking differently: Take 3.75 mg by mouth every other day. 07/01/20  Yes Wendie Agreste, MD  oxybutynin (DITROPAN) 5 MG tablet Take 1 tablet (5 mg total) by mouth 2 (two) times daily. 04/09/20  Yes Wendie Agreste, MD  valsartan (DIOVAN) 80 MG tablet Take 1 tablet (80 mg total) by mouth daily.  04/09/20  Yes Wendie Agreste, MD    Current Outpatient Medications  Medication Sig Dispense Refill   Ascorbic Acid (VITAMIN C PO) Take 1,000 mg by mouth 2 (two) times daily.     aspirin 81 MG tablet Take 1 tablet (81 mg total) by mouth daily. 1 tablet    atorvastatin (LIPITOR) 80 MG tablet Take 1 tablet by mouth once daily (Patient taking differently: Take 80 mg  by mouth daily.) 30 tablet 11   Carboxymeth-Glycerin-Polysorb (REFRESH OPTIVE ADVANCED) 0.5-1-0.5 % SOLN Place 1 drop into both eyes every 3 (three) hours as needed (dry eye).     Carboxymethylcellul-Glycerin (REFRESH OPTIVE) 1-0.9 % GEL Place 1 drop into both eyes at bedtime.     Cholecalciferol (VITAMIN D3) 25 MCG (1000 UT) CAPS Take 1,000 Units by mouth 2 (two) times daily.     co-enzyme Q-10 30 MG capsule Take 100 mg by mouth 2 (two) times daily.     fish oil-omega-3 fatty acids 1000 MG capsule Take 1,000 mg by mouth 3 (three) times daily.     meloxicam (MOBIC) 7.5 MG tablet Take 1 tablet by mouth once daily (Patient taking differently: Take 3.75 mg by mouth every other day.) 45 tablet 0   oxybutynin (DITROPAN) 5 MG tablet Take 1 tablet (5 mg total) by mouth 2 (two) times daily. 180 tablet 3   valsartan (DIOVAN) 80 MG tablet Take 1 tablet (80 mg total) by mouth daily. 90 tablet 3   No current facility-administered medications for this visit.    Allergies  Allergen Reactions   Hydrochlorothiazide     Hypercalcemia       Review of Systems:   General:  normal appetite, normal energy but fatigues with moderate exertion, no weight gain, + weight loss, no fever  Cardiac:  + chest pain with exertion, no chest pain at rest, +SOB with moderate exertion, no resting SOB, no PND, no orthopnea, no palpitations, no arrhythmia, no atrial fibrillation, no LE edema, + dizzy spells, no syncope  Respiratory:  + exertional shortness of breath, no home oxygen, no productive cough, no dry cough, no bronchitis, no wheezing, no hemoptysis, no  asthma, no pain with inspiration or cough, no sleep apnea, no CPAP at night  GI:   no difficulty swallowing, no reflux, no frequent heartburn, no hiatal hernia, no abdominal pain, no constipation, no diarrhea, no hematochezia, no hematemesis, no melena  GU:   no dysuria,  + frequency, no urinary tract infection, no hematuria,  no kidney stones, no kidney disease  Vascular:  no pain suggestive of claudication, no pain in feet, no leg cramps, no varicose veins, no DVT, no non-healing foot ulcer  Neuro:   no stroke, no TIA's, no seizures, no headaches, no temporary blindness one eye,  no slurred speech, no peripheral neuropathy, no chronic pain, no instability of gait, no memory/cognitive dysfunction  Musculoskeletal: + arthritis, no joint swelling, no myalgias, no difficulty walking, normal mobility   Skin:   no rash, no itching, no skin infections, no pressure sores or ulcerations  Psych:   no anxiety, no depression, no nervousness, no unusual recent stress  Eyes:   no blurry vision, no floaters, no recent vision changes,  wears glasses  ENT:   + hearing loss, no loose or painful teeth, + dentures  Hematologic:  no easy bruising, no abnormal bleeding, no clotting disorder, no frequent epistaxis  Endocrine:  no diabetes, does not check CBG's at home     Physical Exam:   Ht 5' (1.524 m)   BMI 31.64 kg/m   General:  well-appearing  HEENT:  Unremarkable, NCAT, PERLA, EOMI,   Neck:   no JVD, no bruits, no adenopathy   Chest:   clear to auscultation, symmetrical breath sounds, no wheezes, no rhonchi   CV:   RRR, 3/6 systolic murmur RSB  Abdomen:  soft, non-tender, no masses   Extremities:  warm, well-perfused, pulses palpable at ankle,  no lower extremity edema  Rectal/GU  Deferred  Neuro:   Grossly non-focal and symmetrical throughout  Skin:   Clean and dry, no rashes, no breakdown  Diagnostic Tests:  ECHOCARDIOGRAM REPORT         Patient Name:   ADESEWA PARLIER Date of Exam: 09/09/2020   Medical Rec #:  [id number]     Height:       61.0 in  Accession #:    [id number]    Weight:       170.7 lb  Date of Birth:  [birth year]    BSA:          1.766 m  Patient Age:    13 years      BP:           135/70 mmHg  Patient Gender: F             HR:           40 bpm.  [facility]   Procedure: 2D Echo, Cardiac Doppler and Color Doppler   Indications:    I35.0 Nonrheumatic aortic (valve) stenosis     History:        Patient has prior history of Echocardiogram examinations,  most                  recent 03/05/2020. Risk Factors:Hypertension and  Dyslipidemia.                  Obesity.     Sonographer:    Diamond Nickel RCS  Referring Phys: [place]     1. Left ventricular ejection fraction, by estimation, is 70 to 75%. The  left ventricle has hyperdynamic function. The left ventricle has no  regional wall motion abnormalities. There is mild concentric left  ventricular hypertrophy. Left ventricular  diastolic parameters are consistent with Grade I diastolic dysfunction  (impaired relaxation). Elevated left atrial pressure.   2. Right ventricular systolic function is normal. The right ventricular  size is normal.   3. The mitral valve is normal in structure. Trivial mitral valve  regurgitation. No evidence of mitral stenosis.   4. The aortic valve is tricuspid. There is severe calcification of the  aortic valve. There is severe thickening of the aortic valve. Aortic valve  regurgitation is mild. Severe aortic valve stenosis. Aortic valve area, by  VTI measures 0.67 cm. Aortic  valve mean gradient measures 42.5 mmHg. Aortic valve Vmax measures 4.57  m/s. Aortic valve acceleration time measures 130 msec.   5. The inferior vena cava is normal in size with greater than 50%  respiratory variability, suggesting right atrial pressure of 3 mmHg.   Comparison(s): No significant change from prior study. Prior images  reviewed side by side.  Aortic stenosis remains severe.   FINDINGS   Left Ventricle: Left ventricular ejection fraction, by estimation, is 70  to 75%. The left ventricle has hyperdynamic function. The left ventricle  has no regional wall motion abnormalities. The left ventricular internal  cavity size was normal in size.  There is mild concentric left ventricular hypertrophy. Left ventricular  diastolic parameters are consistent with Grade I diastolic dysfunction  (impaired relaxation). Elevated left atrial pressure.   Right Ventricle: The right ventricular size is normal. No increase in  right ventricular wall thickness. Right ventricular systolic function is  normal.   Left Atrium: Left atrial size was normal in size.   Right Atrium: Right atrial size  was normal in size.   Pericardium: There is no evidence of pericardial effusion.   Mitral Valve: The mitral valve is normal in structure. Trivial mitral  valve regurgitation. No evidence of mitral valve stenosis.   Tricuspid Valve: The tricuspid valve is normal in structure. Tricuspid  valve regurgitation is not demonstrated. No evidence of tricuspid  stenosis.   Aortic Valve: The aortic valve is tricuspid. There is severe calcifcation  of the aortic valve. There is severe thickening of the aortic valve.  Aortic valve regurgitation is mild. Aortic regurgitation PHT measures 906  msec. Severe aortic stenosis is  present. Aortic valve mean gradient measures 42.5 mmHg. Aortic valve peak  gradient measures 83.5 mmHg. Aortic valve area, by VTI measures 0.67 cm.   Pulmonic Valve: The pulmonic valve was normal in structure. Pulmonic valve  regurgitation is trivial. No evidence of pulmonic stenosis.   Aorta: The aortic root is normal in size and structure.   Venous: The inferior vena cava is normal in size with greater than 50%  respiratory variability, suggesting right atrial pressure of 3 mmHg.   IAS/Shunts: No atrial level shunt detected by color flow  Doppler.      LEFT VENTRICLE  PLAX 2D  LVIDd:         3.20 cm  Diastology  LVIDs:         1.60 cm  LV e' medial:    4.43 cm/s  LV PW:         1.20 cm  LV E/e' medial:  16.5  LV IVS:        1.50 cm  LV e' lateral:   4.87 cm/s  LVOT diam:     2.05 cm  LV E/e' lateral: 15.1  LV SV:         71  LV SV Index:   40  LVOT Area:     3.30 cm      RIGHT VENTRICLE  RV S prime:     8.18 cm/s  TAPSE (M-mode): 1.6 cm   LEFT ATRIUM             Index       RIGHT ATRIUM          Index  LA diam:        3.60 cm 2.04 cm/m  RA Area:     9.41 cm  LA Vol (A2C):   28.7 ml 16.25 ml/m RA Volume:   16.90 ml 9.57 ml/m  LA Vol (A4C):   13.1 ml 7.42 ml/m  LA Biplane Vol: 19.9 ml 11.27 ml/m   AORTIC VALVE  AV Area (Vmax):    0.66 cm  AV Area (Vmean):   0.75 cm  AV Area (VTI):     0.67 cm  AV Vmax:           456.85 cm/s  AV Vmean:          291.198 cm/s  AV VTI:            1.055 m  AV Peak Grad:      83.5 mmHg  AV Mean Grad:      42.5 mmHg  LVOT Vmax:         90.80 cm/s  LVOT Vmean:        66.300 cm/s  LVOT VTI:          0.214 m  LVOT/AV VTI ratio: 0.20  AI PHT:            906 msec  AORTA  Ao Root diam: 2.80 cm   MITRAL VALVE  MV Area (PHT): 2.54 cm     SHUNTS  MV Decel Time: 299 msec     Systemic VTI:  0.21 m  MV E velocity: 73.30 cm/s   Systemic Diam: 2.05 cm  MV A velocity: 118.00 cm/s  MV E/A ratio:  0.62   Mihai Croitoru MD  Electronically signed by Sanda Klein MD  Signature Date/Time: 09/09/2020/3:54:24 PM         Final     Physicians  Panel Physicians Referring Physician Case Authorizing Physician  Sherren Mocha, MD (Primary)      Procedures  RIGHT/LEFT HEART CATH AND CORONARY ANGIOGRAPHY    Conclusion      There is severe aortic valve stenosis.   1.  Patent coronary arteries with mild calcification and luminal irregularity, no significant stenoses 2.  Normal right heart pressures 3.  Critical aortic stenosis with mean gradient 78 mmHg, peak to peak  gradient 86 mmHg, calculated aortic valve area 0.37 cm.   Recommendations: expedited workup for treatment of severe aortic stenosis       Procedural Details  Technical Details INDICATION: Severe symptomatic aortic stenosis  PROCEDURAL DETAILS: There was an indwelling IV in a left antecubital vein. Using normal sterile technique, the IV was changed out for a 5 Fr brachial sheath over a 0.018 inch wire. The right wrist was then prepped, draped, and anesthetized with 1% lidocaine. Using the modified Seldinger technique a 5/6 French Slender sheath was placed in the right radial artery. Intra-arterial verapamil was administered through the radial artery sheath. IV heparin was administered after a JR4 catheter was advanced into the central aorta. A Swan-Ganz catheter was used for the right heart catheterization. Standard protocol was followed for recording of right heart pressures and sampling of oxygen saturations. Fick cardiac output was calculated. Standard Judkins catheters were used for selective coronary angiography. LV pressure is recorded and an aortic valve pullback is performed. There were no immediate procedural complications. The patient was transferred to the post catheterization recovery area for further monitoring.      Estimated blood loss <50 mL.   During this procedure medications were administered to achieve and maintain moderate conscious sedation while the patient's heart rate, blood pressure, and oxygen saturation were continuously monitored and I was present face-to-face 100% of this time.    Medications (Filter: Administrations occurring from 1115 to 1224 on 10/21/20)  important  Continuous medications are totaled by the amount administered until 10/21/20 1224.    midazolam (VERSED) injection (mg) Total dose:  2 mg  Date/Time Rate/Dose/Volume Action   10/21/20 1149 2 mg Given    fentaNYL (SUBLIMAZE) injection (mcg) Total dose:  25 mcg  Date/Time Rate/Dose/Volume  Action   10/21/20 1149 25 mcg Given    Heparin (Porcine) in NaCl 1000-0.9 UT/500ML-% SOLN (mL) Total volume:  1,000 mL  Date/Time Rate/Dose/Volume Action   10/21/20 1150 500 mL Given   1151 500 mL Given    lidocaine (PF) (XYLOCAINE) 1 % injection (mL) Total volume:  4 mL  Date/Time Rate/Dose/Volume Action   10/21/20 1151 2 mL Given   1158 2 mL Given    Radial Cocktail/Verapamil only (mL) Total volume:  10 mL  Date/Time Rate/Dose/Volume Action   10/21/20 1201 10 mL Given    heparin sodium (porcine) injection (Units) Total dose:  3,500 Units  Date/Time Rate/Dose/Volume Action   10/21/20 1205 3,500 Units Given    iohexol (OMNIPAQUE) 350 MG/ML injection (  mL) Total volume:  55 mL  Date/Time Rate/Dose/Volume Action   10/21/20 1219 55 mL Given     Sedation Time  Sedation Time Physician-1: 26 minutes 10 seconds   Contrast  Medication Name Total Dose  iohexol (OMNIPAQUE) 350 MG/ML injection 55 mL    Radiation/Fluoro  Fluoro time: 6.9 (min) DAP: 10.8 (Gycm2) Cumulative Air Kerma: 213.3 (mGy)   Coronary Findings   Diagnostic Dominance: Right  Left Main  Vessel is angiographically normal.  Left Anterior Descending  The vessel exhibits minimal luminal irregularities.  Left Circumflex  Vessel is angiographically normal.  Right Coronary Artery  The vessel exhibits minimal luminal irregularities.   Intervention   No interventions have been documented.          Left Heart  Aortic Valve There is severe aortic valve stenosis. The aortic valve is calcified. There is restricted aortic valve motion.    Coronary Diagrams   Diagnostic Dominance: Right    Intervention     Implants     No implant documentation for this case.    Syngo Images   Show images for CARDIAC CATHETERIZATION  Images on Long Term Storage   Show images for Zeppieri, Kortney J  Link to Procedure Log  Procedure Log      Hemo Data  Flowsheet Row Most Recent  Value  Fick Cardiac Output 3 L/min  Fick Cardiac Output Index 1.71 (L/min)/BSA  Aortic Mean Gradient 77.82 mmHg  Aortic Peak Gradient 86 mmHg  Aortic Valve Area 0.37  Aortic Value Area Index 0.21 cm2/BSA  RA A Wave 5 mmHg  RA V Wave 2 mmHg  RA Mean 2 mmHg  RV Systolic Pressure 29 mmHg  RV Diastolic Pressure 3 mmHg  RV EDP 5 mmHg  PA Systolic Pressure 31 mmHg  PA Diastolic Pressure 1 mmHg  PA Mean 15 mmHg  PW A Wave 9 mmHg  PW V Wave 8 mmHg  PW Mean 7 mmHg  AO Systolic Pressure [id number] mmHg  AO Diastolic Pressure 54 mmHg  AO Mean 77 mmHg  LV Systolic Pressure [id number] mmHg  LV Diastolic Pressure -1 mmHg  LV EDP 22 mmHg  AOp Systolic Pressure [id number] mmHg  AOp Diastolic Pressure 53 mmHg  AOp Mean Pressure 81 mmHg  LVp Systolic Pressure [id number] mmHg  LVp Diastolic Pressure 0 mmHg  LVp EDP Pressure 22 mmHg  QP/QS 1  TPVR Index 8.76 HRUI  TSVR Index 44.99 HRUI  PVR SVR Ratio 0.11  TPVR/TSVR Ratio 0.19     ADDENDUM REPORT: 10/29/2020 14:24   EXAM: OVER-READ INTERPRETATION  CT CHEST   The following report is an over-read performed by radiologist Dr. Samara Snide [facility] on 10/29/2020. This over-read does not include interpretation of cardiac or coronary anatomy or pathology. The coronary CTA interpretation by the cardiologist is attached.   COMPARISON:  None.   FINDINGS: Please see the separate concurrent chest CT angiogram report for details.   IMPRESSION: Please see the separate concurrent chest CT angiogram report for details.     Electronically Signed   By: Ilona Sorrel M.D.   On: 10/29/2020 14:24    Addended by Sharyn Blitz, MD on 10/29/2020  2:59 PM    Study Result  Narrative & Impression  CLINICAL DATA:  Aortic stenosis   EXAM: Cardiac TAVR CT   TECHNIQUE: The patient was scanned on a Siemens Force [id number] slice scanner. A 120 kV retrospective scan was triggered in the descending thoracic aorta at 111 HU's. Gantry rotation  speed was  270 msecs and collimation was .9 mm. No beta blockade or nitro were given. The 3D data set was reconstructed in 5% intervals of the R-R cycle. Systolic and diastolic phases were analyzed on a dedicated work station using MPR, MIP and VRT modes. The patient received 100 cc of contrast.   FINDINGS: Aortic Valve: Severely thickened aortic valve with heavy calcification and reduced excursion the planimeter valve area is 0.786 Sq cm consistent with severe aortic stenosis   Number of leaflets: 3   LVOT calcification: None   Annular calcification: Mild   Prosthetic Valve: NA   Left ventricle: LVEF 60% without evidence of wall motion abnormality   Aortic Annulus Measurements   Major annulus diameter: 25 mm   Minor annulus diameter:20 mm   Annular perimeter: 69 mm   Annular area: 3.7 sq cm   Aortic Root Measurements   Sinus of Valsalva perimeter: 92 mm   Sinotubular Junction: 28 mm   Ascending Thoracic Aorta: 38 mm   Aortic Arch: 29 mm   Descending Thoracic Aorta: 20 mm   Sinus of Valsalva Measurements:   Right coronary cusp width: 24 mm   Left coronary cusp width: 29 mm   Non coronary cusp width: 29 mm   Coronary Artery Height above Annulus:   Left Main: 13 mm   Right Coronary: 12 mm   Optimum Fluoroscopic Angle for Delivery: LAO 21 CAU 0   Valves for structural team consideration: 26 mm CoreValve, 23 mm Edwards Sapien   IMPRESSION: 1. Severe Aortic stenosis. Findings pertinent to TAVR procedure are detailed above.   Mahesh  Chandrasekhar   Electronically Signed: By: Rudean Haskell MD On: 10/29/2020 14:11      Narrative & Impression  CLINICAL DATA:  Non rheumatic aortic stenosis.  TAVR planning.   EXAM: CT ANGIOGRAPHY CHEST, ABDOMEN AND PELVIS   TECHNIQUE: Multidetector CT imaging through the chest, abdomen and pelvis was performed using the standard protocol during bolus administration of intravenous contrast. Multiplanar  reconstructed images and MIPs were obtained and reviewed to evaluate the vascular anatomy.   CONTRAST:  172m OMNIPAQUE IOHEXOL 350 MG/ML SOLN   COMPARISON:  None.   FINDINGS: CTA CHEST FINDINGS   Cardiovascular: Mild cardiomegaly. Diffuse thickening and coarse calcification of the aortic valve. No significant pericardial effusion/thickening. Atherosclerotic thoracic aorta with dilated 4.0 cm diameter ascending thoracic aorta. Normal caliber main pulmonary artery. No central pulmonary emboli.   Mediastinum/Nodes: Bilateral thyroid nodules, largest a hypodense 2.3 cm left thyroid nodule. Unremarkable esophagus. No pathologically enlarged axillary, mediastinal or hilar lymph nodes.   Lungs/Pleura: No pneumothorax. No pleural effusion. No acute consolidative airspace disease, lung masses or significant pulmonary nodules.   Musculoskeletal: No aggressive appearing focal osseous lesions. Mild thoracic spondylosis.   CTA ABDOMEN AND PELVIS FINDINGS   Hepatobiliary: Normal liver with no liver mass. Cholecystectomy. No biliary ductal dilatation.   Pancreas: Normal, with no mass or duct dilation.   Spleen: Normal size. No mass.   Adrenals/Urinary Tract: Normal adrenals. No contour deforming renal masses. No hydronephrosis. Normal bladder.   Stomach/Bowel: Small hiatal hernia. Otherwise normal nondistended stomach. Normal caliber small bowel with no small bowel wall thickening. Appendectomy. Marked diffuse colonic diverticulosis, most prominent in the sigmoid colon, with no definite large bowel wall thickening or significant pericolonic fat stranding.   Vascular/Lymphatic: Atherosclerotic nonaneurysmal abdominal aorta. No pathologically enlarged lymph nodes in the abdomen or pelvis.   Reproductive: Status post hysterectomy, with no abnormal findings at the vaginal  cuff. No adnexal mass.   Other: No pneumoperitoneum, ascites or focal fluid collection.   Musculoskeletal: No  aggressive appearing focal osseous lesions. Marked lumbar spondylosis. Sclerosis throughout the L2 vertebral body with associated trabecular thickening, suggesting Paget's disease.   VASCULAR MEASUREMENTS PERTINENT TO TAVR:   AORTA:   Minimal Aortic Diameter-10.8 x 10.3 mm   Severity of Aortic Calcification-mild-to-moderate   RIGHT PELVIS:   Right Common Iliac Artery -   Minimal Diameter-6.8 x 5.5 mm   Tortuosity-mild   Calcification-mild-to-moderate   Right External Iliac Artery -   Minimal Diameter-5.6 x 5.4 mm   Tortuosity-mild-to-moderate   Calcification-none   Right Common Femoral Artery -   Minimal Diameter-6.1 x 6.0 mm   Tortuosity-mild   Calcification-none   LEFT PELVIS:   Left Common Iliac Artery -   Minimal Diameter-7.5 x 6.8 mm   Tortuosity-mild-to-moderate   Calcification-mild   Left External Iliac Artery -   Minimal Diameter-5.9 x 5.5 mm   Tortuosity-mild to moderate   Calcification-none   Left Common Femoral Artery -   Minimal Diameter-6.5 x 6.5 mm   Tortuosity-mild   Calcification-none   Review of the MIP images confirms the above findings.   IMPRESSION: 1. Vascular findings and measurements pertinent to potential TAVR procedure, as detailed. 2. Diffuse thickening and coarse calcification of the aortic valve, compatible with reported aortic stenosis. 3. Bilateral thyroid nodules, largest 2.3 cm on the left. Recommend thyroid US (ref: J Am Coll Radiol. 2015 Feb;12(2): 143-50). 4. Mild cardiomegaly. 5. Marked diffuse colonic diverticulosis. 6. Small hiatal hernia. 7. Sclerosis throughout the L2 vertebral body with associated trabecular thickening, suggesting Paget's disease. 8. Aortic Atherosclerosis (ICD10-I70.0).     Electronically Signed   By: Ilona Sorrel M.D.   On: 10/29/2020 14:23                   STS Risk:  Procedure: Isolated AVR  Risk of Mortality:  1.572%  Renal Failure:  0.994%  Permanent Stroke:   1.304%  Prolonged Ventilation:  3.875%  DSW Infection:  0.078%  Reoperation:  2.860%  Morbidity or Mortality:  7.051%  Short Length of Stay:  47.653%  Long Length of Stay:  2.864%   Impression:  This 75 year old woman has stage D, severe, symptomatic aortic stenosis with New York Heart Association class II symptoms of exertional fatigue and shortness of breath consistent with chronic diastolic congestive heart failure.  She has also developed episodes of dizziness and chest discomfort with exertion.  She is still very active out working in her yard.  I have personally reviewed her 2D echocardiogram, cardiac catheterization, and CTA studies.  Her echo shows a trileaflet aortic valve with severe calcification and thickening of the leaflets with restricted mobility.  The mean gradient across her aortic valve was 42.5 mmHg with a valve area of 0.67 cm consistent with severe aortic stenosis.  Left ventricular systolic function is normal.  Cardiac catheterization showed no significant coronary stenoses.  The mean gradient across aortic valve was measured at 78 mmHg with a peak to peak gradient of 86 mmHg and a valve area of 0.37 cm consistent with critical aortic stenosis.  I agree that aortic valve replacement is indicated in this patient for relief of her symptoms and to prevent progressive left ventricular deterioration and sudden death.  Given her age I think transcatheter aortic valve replacement would be the best option for her.  Her gated cardiac CTA shows anatomy suitable for TAVR using a 23 mm  Edwards SAPIEN 3 valve.  Her abdominal and pelvic CTA shows adequate pelvic vascular anatomy to allow transfemoral insertion.  The patient and her daughter were counseled at length regarding treatment alternatives for management of severe symptomatic aortic stenosis. The risks and benefits of surgical intervention has been discussed in detail. Long-term prognosis with medical therapy was discussed.  Alternative approaches such as conventional surgical aortic valve replacement, transcatheter aortic valve replacement, and palliative medical therapy were compared and contrasted at length. This discussion was placed in the context of the patient's own specific clinical presentation and past medical history. All of their questions have been addressed.   Following the decision to proceed with transcatheter aortic valve replacement, a discussion was held regarding what types of management strategies would be attempted intraoperatively in the event of life-threatening complications, including whether or not the patient would be considered a candidate for the use of cardiopulmonary bypass and/or conversion to open sternotomy for attempted surgical intervention.  I think she would certainly be a candidate for emergent sternotomy to manage any intraoperative complications.  The patient is aware of the fact that transient use of cardiopulmonary bypass may be necessary. The patient has been advised of a variety of complications that might develop including but not limited to risks of death, stroke, paravalvular leak, aortic dissection or other major vascular complications, aortic annulus rupture, device embolization, cardiac rupture or perforation, mitral regurgitation, acute myocardial infarction, arrhythmia, heart block or bradycardia requiring permanent pacemaker placement, congestive heart failure, respiratory failure, renal failure, pneumonia, infection, other late complications related to structural valve deterioration or migration, or other complications that might ultimately cause a temporary or permanent loss of functional independence or other long term morbidity. The patient provides full informed consent for the procedure as described and all questions were answered.      Plan:  She will be scheduled for transfemoral transcatheter aortic valve replacement on 11/17/2020.  I spent 60 minutes performing  this consultation and > 50% of this time was spent face to face counseling and coordinating the care of this patient's severe symptomatic aortic stenosis.   Gaye Pollack, MD 11/04/2020 1:51 PM

## 2020-11-05 ENCOUNTER — Inpatient Hospital Stay: Admission: RE | Admit: 2020-11-05 | Payer: Medicare Other | Source: Ambulatory Visit

## 2020-11-06 ENCOUNTER — Other Ambulatory Visit: Payer: Self-pay

## 2020-11-06 DIAGNOSIS — I350 Nonrheumatic aortic (valve) stenosis: Secondary | ICD-10-CM

## 2020-11-07 ENCOUNTER — Other Ambulatory Visit: Payer: Self-pay

## 2020-11-07 ENCOUNTER — Ambulatory Visit
Admission: RE | Admit: 2020-11-07 | Discharge: 2020-11-07 | Disposition: A | Discharge status: Home / Self Care | Payer: Medicare Other | Source: Ambulatory Visit | Attending: Family Medicine | Admitting: Family Medicine

## 2020-11-07 DIAGNOSIS — Z1231 Encounter for screening mammogram for malignant neoplasm of breast: Secondary | ICD-10-CM

## 2020-11-12 NOTE — Progress Notes (Signed)
Surgical Instructions    Your procedure is scheduled on 11/17/20.  Report to [facility] Main Entrance "A" at 5:30 A.M., then check in with the Admitting office.  Call this number if you have problems the morning of surgery:  [phone]   If you have any questions prior to your surgery date call [phone]: Open Monday-Friday 8am-4pm    Remember:  Do not eat jor drink after midnight the night before your surgery     Take these medicines the morning of surgery with A SIP OF WATER: NONE  Continue taking all medications including Aspirin through day before surgery.  Do not take any medications day of surgery.  As of today, STOP taking, Meloxicam, Aleve, Naproxen, Ibuprofen, Motrin, Advil, Goody's, BC's, all herbal medications, fish oil, and all vitamins.          Do not wear jewelry or makeup Do not wear lotions, powders, perfumes, or deodorant. Do not shave 48 hours prior to surgery.  M Do not bring valuables to the hospital. DO Not wear nail polish, gel polish, artificial nails, or any other type of covering on  natural nails including finger and toenails. If patients have artificial nails, gel coating, etc. that need to be removed by a nail salon please have this removed prior to surgery or surgery may need to be canceled/delayed if the surgeon/ anesthesia feels like the patient is unable to be adequately monitored.             [facility] is not responsible for any belongings or valuables.  Do NOT Smoke (Tobacco/Vaping) or drink Alcohol 24 hours prior to your procedure If you use a CPAP at night, you may bring all equipment for your overnight stay.   Contacts, glasses, dentures or bridgework may not be worn into surgery, please bring cases for these belongings   For patients admitted to the hospital, discharge time will be determined by your treatment team.   Patients discharged the day of surgery will not be allowed to drive home, and someone needs to stay with them for 24  hours.  ONLY 1 SUPPORT PERSON MAY BE PRESENT WHILE YOU ARE IN SURGERY. IF YOU ARE TO BE ADMITTED ONCE YOU ARE IN YOUR ROOM YOU WILL BE ALLOWED TWO (2) VISITORS.  Minor children may have two parents present. Special consideration for safety and communication needs will be reviewed on a case by case basis.  Special instructions:    Oral Hygiene is also important to reduce your risk of infection.  Remember - BRUSH YOUR TEETH THE MORNING OF SURGERY WITH YOUR REGULAR TOOTHPASTE   - Preparing For Surgery  Before surgery, you can play an important role. Because skin is not sterile, your skin needs to be as free of germs as possible. You can reduce the number of germs on your skin by washing with CHG (chlorahexidine gluconate) Soap before surgery.  CHG is an antiseptic cleaner which kills germs and bonds with the skin to continue killing germs even after washing.     Please do not use if you have an allergy to CHG or antibacterial soaps. If your skin becomes reddened/irritated stop using the CHG.  Do not shave (including legs and underarms) for at least 48 hours prior to first CHG shower. It is OK to shave your face.  Please follow these instructions carefully.     Shower the NIGHT BEFORE SURGERY and the MORNING OF SURGERY with CHG Soap.   If you chose to wash your  hair, wash your hair first as usual with your normal shampoo. After you shampoo, rinse your hair and body thoroughly to remove the shampoo.  Then [employer] and genitals (private parts) with your normal soap and rinse thoroughly to remove soap.  After that Use CHG Soap as you would any other liquid soap. You can apply CHG directly to the skin and wash gently with a scrungie or a clean washcloth.   Apply the CHG Soap to your body ONLY FROM THE NECK DOWN.  Do not use on open wounds or open sores. Avoid contact with your eyes, ears, mouth and genitals (private parts). Wash Face and genitals (private parts)  with your normal soap.    Wash thoroughly, paying special attention to the area where your surgery will be performed.  Thoroughly rinse your body with warm water from the neck down.  DO NOT shower/wash with your normal soap after using and rinsing off the CHG Soap.  Pat yourself dry with a CLEAN TOWEL.  Wear CLEAN PAJAMAS to bed the night before surgery  Place CLEAN SHEETS on your bed the night before your surgery  DO NOT SLEEP WITH PETS.   Day of Surgery:  Take a shower with CHG soap. Wear Clean/Comfortable clothing the morning of surgery Do not apply any deodorants/lotions.   Remember to brush your teeth WITH YOUR REGULAR TOOTHPASTE.   Please read over the following fact sheets that you were given.

## 2020-11-13 ENCOUNTER — Encounter (HOSPITAL_COMMUNITY): Payer: Self-pay

## 2020-11-13 ENCOUNTER — Encounter (HOSPITAL_COMMUNITY)
Admission: RE | Admit: 2020-11-13 | Discharge: 2020-11-13 | Disposition: A | Discharge status: Home / Self Care | Payer: Medicare Other | Source: Ambulatory Visit | Attending: Cardiovascular Disease | Admitting: Cardiovascular Disease

## 2020-11-13 ENCOUNTER — Other Ambulatory Visit: Payer: Self-pay

## 2020-11-13 ENCOUNTER — Ambulatory Visit (HOSPITAL_COMMUNITY)
Admission: RE | Admit: 2020-11-13 | Discharge: 2020-11-13 | Disposition: A | Discharge status: Home / Self Care | Payer: Medicare Other | Source: Ambulatory Visit | Attending: Cardiovascular Disease | Admitting: Cardiovascular Disease

## 2020-11-13 DIAGNOSIS — Z6832 Body mass index (BMI) 32.0-32.9, adult: Secondary | ICD-10-CM | POA: Diagnosis not present

## 2020-11-13 DIAGNOSIS — Z79899 Other long term (current) drug therapy: Secondary | ICD-10-CM | POA: Diagnosis not present

## 2020-11-13 DIAGNOSIS — Z20822 Contact with and (suspected) exposure to covid-19: Secondary | ICD-10-CM | POA: Insufficient documentation

## 2020-11-13 DIAGNOSIS — E669 Obesity, unspecified: Secondary | ICD-10-CM | POA: Insufficient documentation

## 2020-11-13 DIAGNOSIS — E785 Hyperlipidemia, unspecified: Secondary | ICD-10-CM | POA: Insufficient documentation

## 2020-11-13 DIAGNOSIS — Z7982 Long term (current) use of aspirin: Secondary | ICD-10-CM | POA: Insufficient documentation

## 2020-11-13 DIAGNOSIS — I350 Nonrheumatic aortic (valve) stenosis: Secondary | ICD-10-CM | POA: Insufficient documentation

## 2020-11-13 DIAGNOSIS — R011 Cardiac murmur, unspecified: Secondary | ICD-10-CM | POA: Insufficient documentation

## 2020-11-13 DIAGNOSIS — Z01818 Encounter for other preprocedural examination: Secondary | ICD-10-CM

## 2020-11-13 DIAGNOSIS — Z791 Long term (current) use of non-steroidal anti-inflammatories (NSAID): Secondary | ICD-10-CM | POA: Diagnosis not present

## 2020-11-13 DIAGNOSIS — I10 Essential (primary) hypertension: Secondary | ICD-10-CM | POA: Insufficient documentation

## 2020-11-13 HISTORY — DX: Personal history of urinary calculi: Z87.442

## 2020-11-13 LAB — URINALYSIS, ROUTINE W REFLEX MICROSCOPIC
Bilirubin Urine: NEGATIVE
Glucose, UA: NEGATIVE mg/dL
Hgb urine dipstick: NEGATIVE
Ketones, ur: NEGATIVE mg/dL
Nitrite: NEGATIVE
Protein, ur: NEGATIVE mg/dL
Specific Gravity, Urine: 1.014 (ref 1.005–1.030)
pH: 6 (ref 5.0–8.0)

## 2020-11-13 LAB — CBC
HCT: 48.6 % — ABNORMAL HIGH (ref 36.0–46.0)
Hemoglobin: 16.6 g/dL — ABNORMAL HIGH (ref 12.0–15.0)
MCH: 31.3 pg (ref 26.0–34.0)
MCHC: 34.2 g/dL (ref 30.0–36.0)
MCV: 91.7 fL (ref 80.0–100.0)
Platelets: 277 10*3/uL (ref 150–400)
RBC: 5.3 MIL/uL — ABNORMAL HIGH (ref 3.87–5.11)
RDW: 13.1 % (ref 11.5–15.5)
WBC: 11.6 10*3/uL — ABNORMAL HIGH (ref 4.0–10.5)
nRBC: 0 % (ref 0.0–0.2)

## 2020-11-13 LAB — COMPREHENSIVE METABOLIC PANEL
ALT: 19 U/L (ref 0–44)
AST: 20 U/L (ref 15–41)
Albumin: 4.2 g/dL (ref 3.5–5.0)
Alkaline Phosphatase: 101 U/L (ref 38–126)
Anion gap: 10 (ref 5–15)
BUN: 22 mg/dL (ref 8–23)
CO2: 25 mmol/L (ref 22–32)
Calcium: 9.9 mg/dL (ref 8.9–10.3)
Chloride: 104 mmol/L (ref 98–111)
Creatinine, Ser: 1.05 mg/dL — ABNORMAL HIGH (ref 0.44–1.00)
GFR, Estimated: 56 mL/min — ABNORMAL LOW (ref >60)
Glucose, Bld: 102 mg/dL — ABNORMAL HIGH (ref 70–99)
Potassium: 4.2 mmol/L (ref 3.5–5.1)
Sodium: 139 mmol/L (ref 135–145)
Total Bilirubin: 0.8 mg/dL (ref 0.3–1.2)
Total Protein: 7 g/dL (ref 6.5–8.1)

## 2020-11-13 LAB — PROTIME-INR
INR: 1 (ref 0.8–1.2)
Prothrombin Time: 12.9 seconds (ref 11.4–15.2)

## 2020-11-13 LAB — TYPE AND SCREEN
ABO/RH(D): O POS
Antibody Screen: NEGATIVE

## 2020-11-13 LAB — SURGICAL PCR SCREEN
MRSA, PCR: NEGATIVE
Staphylococcus aureus: POSITIVE — AB

## 2020-11-13 LAB — SARS CORONAVIRUS 2 (TAT 6-24 HRS): SARS Coronavirus 2: NEGATIVE

## 2020-11-13 NOTE — Progress Notes (Signed)
PCP: Leretha Pol NP Cardiologist: Dr. Sallyanne Kuster  EKG: Today CXR: Today ECHO:09-09-20 Stress Test: 02-16-16 Cardiac Cath: 10-21-20  Covid test during PAT appt  Patient denies shortness of breath, fever, cough, and chest pain at PAT appointment.  Patient verbalized understanding of instructions provided today at the PAT appointment.  Patient asked to review instructions at home and day of surgery.   Pt will need ABG DOS.

## 2020-11-13 NOTE — Progress Notes (Signed)
Inboxed Dr. Burt Knack, Dr. Cyndia Bent and Theodosia Quay, RN with UA results on pt.

## 2020-11-16 MED ORDER — DEXMEDETOMIDINE HCL IN NACL 400 MCG/100ML IV SOLN
0.1000 ug/kg/h | INTRAVENOUS | Status: AC
  Administered 2020-11-17: 75.52 ug via INTRAVENOUS
  Administered 2020-11-17: 1 ug/kg/h via INTRAVENOUS
  Filled 2020-11-16: qty 100

## 2020-11-16 MED ORDER — CEFAZOLIN SODIUM-DEXTROSE 2-4 GM/100ML-% IV SOLN
2.0000 g | INTRAVENOUS | Status: AC
  Administered 2020-11-17: 2 g via INTRAVENOUS
  Filled 2020-11-16: qty 100

## 2020-11-16 MED ORDER — SODIUM CHLORIDE 0.9 % IV SOLN
INTRAVENOUS | Status: DC
  Filled 2020-11-16: qty 30

## 2020-11-16 MED ORDER — MAGNESIUM SULFATE 50 % IJ SOLN
40.0000 meq | INTRAMUSCULAR | Status: DC
  Filled 2020-11-16: qty 9.85

## 2020-11-16 MED ORDER — NOREPINEPHRINE 4 MG/250ML-% IV SOLN
0.0000 ug/min | INTRAVENOUS | Status: AC
Start: 2020-11-17 — End: 2020-11-17
  Administered 2020-11-17: 2 ug/min via INTRAVENOUS
  Filled 2020-11-16: qty 250

## 2020-11-16 MED ORDER — POTASSIUM CHLORIDE 2 MEQ/ML IV SOLN
80.0000 meq | INTRAVENOUS | Status: DC
  Filled 2020-11-16: qty 40

## 2020-11-16 NOTE — H&P (Signed)
[address]       [place] [address]             [phone]      Cardiothoracic Surgery Admission History and Physical   PCP is Ronnell Freshwater, NP Referring Provider is Sherren Mocha, MD Primary Cardiologist is M. Croitoru, MD   Reason for admission:  Severe aortic stenosis   HPI:   The patient is a 75 year old woman with a history of hypertension, hyperlipidemia, nonobstructive coronary artery disease by prior catheterization in 2000 with a normal stress test in November 2018, and aortic stenosis who was referred for consideration of transcatheter aortic valve replacement.  She had an echocardiogram on 03/05/2020 showing an indeterminate number of aortic valve cusp.  There is severe calcification and thickening of the aortic valve.  The mean gradient was 61 mmHg with an aortic valve area of 0.55 cm.  Left ventricular ejection fraction was 65 to 70% with grade 1 diastolic dysfunction and mild LVH.  She recently started having exertional shortness of breath and weakness with some dizziness when working in her yard during warm weather.  She does not have any symptoms with normal activity around the house.  She has had some chest pain with exertion.  She denies orthopnea.  She has had no peripheral edema.  Her most recent echo on 09/09/2020 showed a trileaflet aortic valve with severe calcification and thickening.  The aortic valve mean gradient was 42.5 mmHg with a peak gradient of 83.5 mmHg.  Peak velocity was 4.57 m/s.  Aortic valve area was 0.67 cm.  There is mild aortic insufficiency.  Left ventricular ejection fraction was 70 to 75%.       Past Medical History:  Diagnosis Date   Aortic valve sclerosis     Coronary atherosclerosis      without significant obstruction   Heart murmur     History of nuclear stress test 07/12/2010    bruce protocol myoview; normal pattern of perfusion, low risk, post-stress EF 92%   Hyperlipidemia     Hypertension              Past Surgical History:  Procedure Laterality Date   ABDOMINAL HYSTERECTOMY        fibroids; ovaries intact.   APPENDECTOMY       CARDIAC CATHETERIZATION        moderate stenosis in 1st diagonal & RCA   CESAREAN SECTION       CHOLECYSTECTOMY       EYE SURGERY       RIGHT/LEFT HEART CATH AND CORONARY ANGIOGRAPHY N/A 10/21/2020    Procedure: RIGHT/LEFT HEART CATH AND CORONARY ANGIOGRAPHY;  Surgeon: Sherren Mocha, MD;  Location: [facility];  Service: Cardiovascular;  Laterality: N/A;   TRANSTHORACIC ECHOCARDIOGRAM   07/12/2010    EF=>55% with normal systolic function; trace MR/TR/PR           Family History  Problem Relation Age of Onset   Heart disease Father 45        AMI   Heart disease Brother     Heart disease Son     Heart disease Paternal Grandfather     Alzheimer's disease Mother     Heart disease Brother          CHF/CAD   COPD Brother     Heart disease Brother     Breast cancer Neg Hx        Social History  Socioeconomic History   Marital status: Widowed      Spouse name: Not on file   Number of children: 1   Years of education: 26   Highest education level: Not on file  Occupational History   Occupation: retired      Comment: Production designer, theatre/television/film  Tobacco Use   Smoking status: Never   Smokeless tobacco: Never  Substance and Sexual Activity   Alcohol use: No   Drug use: No   Sexual activity: Not Currently      Birth control/protection: Post-menopausal, Surgical  Other Topics Concern   Not on file  Social History Narrative    Marital status: widowed since 2014 due to COPD; married x 37 years.       Children: 1 daughter; 1 son died of CHF age 36 pacemaker.  4 grandchildren.       Lives: alone       Employment: retired at age 53 from [employer].       Tobacco:  never        Alcohol:  never       Exercise: yes in 2019; walking 1-2 miles daily.  DDD lumbar limiting walking.       ADLs: independent with ADLs; drives.         Advanced  Directives: none; has paper.  FULL CODE; no prolonged measures.  HCPOA: daughter/Misty Wenn-Maness.    Social Determinants of Health    Financial Resource Strain: Not on file  Food Insecurity: Not on file  Transportation Needs: Not on file  Physical Activity: Not on file  Stress: Not on file  Social Connections: Not on file  Intimate Partner Violence: Not on file             Prior to Admission medications   Medication Sig Start Date End Date Taking? Authorizing Provider  Ascorbic Acid (VITAMIN C PO) Take 1,000 mg by mouth 2 (two) times daily.     Yes [provider]  aspirin 81 MG tablet Take 1 tablet (81 mg total) by mouth daily. 11/29/16   Yes Wardell Honour, MD  atorvastatin (LIPITOR) 80 MG tablet Take 1 tablet by mouth once daily Patient taking differently: Take 80 mg by mouth daily. 06/02/20   Yes Croitoru, Mihai, MD  Carboxymeth-Glycerin-Polysorb (REFRESH OPTIVE ADVANCED) 0.5-1-0.5 % SOLN Place 1 drop into both eyes every 3 (three) hours as needed (dry eye).     Yes [provider]  Carboxymethylcellul-Glycerin (REFRESH OPTIVE) 1-0.9 % GEL Place 1 drop into both eyes at bedtime.     Yes [provider]  Cholecalciferol (VITAMIN D3) 25 MCG (1000 UT) CAPS Take 1,000 Units by mouth 2 (two) times daily.     Yes [provider]  co-enzyme Q-10 30 MG capsule Take 100 mg by mouth 2 (two) times daily.     Yes [provider]  fish oil-omega-3 fatty acids 1000 MG capsule Take 1,000 mg by mouth 3 (three) times daily.     Yes [provider]  meloxicam (MOBIC) 7.5 MG tablet Take 1 tablet by mouth once daily Patient taking differently: Take 3.75 mg by mouth every other day. 07/01/20   Yes Wendie Agreste, MD  oxybutynin (DITROPAN) 5 MG tablet Take 1 tablet (5 mg total) by mouth 2 (two) times daily. 04/09/20   Yes Wendie Agreste, MD  valsartan (DIOVAN) 80 MG tablet Take 1 tablet (80 mg total) by mouth daily. 04/09/20   Yes Wendie Agreste, MD  Current Outpatient Medications  Medication Sig Dispense Refill   Ascorbic Acid (VITAMIN C PO) Take 1,000 mg by mouth 2 (two) times daily.       aspirin 81 MG tablet Take 1 tablet (81 mg total) by mouth daily. 1 tablet     atorvastatin (LIPITOR) 80 MG tablet Take 1 tablet by mouth once daily (Patient taking differently: Take 80 mg by mouth daily.) 30 tablet 11   Carboxymeth-Glycerin-Polysorb (REFRESH OPTIVE ADVANCED) 0.5-1-0.5 % SOLN Place 1 drop into both eyes every 3 (three) hours as needed (dry eye).       Carboxymethylcellul-Glycerin (REFRESH OPTIVE) 1-0.9 % GEL Place 1 drop into both eyes at bedtime.       Cholecalciferol (VITAMIN D3) 25 MCG (1000 UT) CAPS Take 1,000 Units by mouth 2 (two) times daily.       co-enzyme Q-10 30 MG capsule Take 100 mg by mouth 2 (two) times daily.       fish oil-omega-3 fatty acids 1000 MG capsule Take 1,000 mg by mouth 3 (three) times daily.       meloxicam (MOBIC) 7.5 MG tablet Take 1 tablet by mouth once daily (Patient taking differently: Take 3.75 mg by mouth every other day.) 45 tablet 0   oxybutynin (DITROPAN) 5 MG tablet Take 1 tablet (5 mg total) by mouth 2 (two) times daily. 180 tablet 3   valsartan (DIOVAN) 80 MG tablet Take 1 tablet (80 mg total) by mouth daily. 90 tablet 3    No current facility-administered medications for this visit.           Allergies  Allergen Reactions   Hydrochlorothiazide        Hypercalcemia           Review of Systems:               General:                      normal appetite, normal energy but fatigues with moderate exertion, no weight gain, + weight loss, no fever             Cardiac:                       + chest pain with exertion, no chest pain at rest, +SOB with moderate exertion, no resting SOB, no PND, no orthopnea, no palpitations, no arrhythmia, no atrial fibrillation, no LE edema, + dizzy spells, no syncope             Respiratory:                 + exertional shortness of breath, no  home oxygen, no productive cough, no dry cough, no bronchitis, no wheezing, no hemoptysis, no asthma, no pain with inspiration or cough, no sleep apnea, no CPAP at night             GI:                               no difficulty swallowing, no reflux, no frequent heartburn, no hiatal hernia, no abdominal pain, no constipation, no diarrhea, no hematochezia, no hematemesis, no melena             GU:  no dysuria,  + frequency, no urinary tract infection, no hematuria,  no kidney stones, no kidney disease             Vascular:                     no pain suggestive of claudication, no pain in feet, no leg cramps, no varicose veins, no DVT, no non-healing foot ulcer             Neuro:                         no stroke, no TIA's, no seizures, no headaches, no temporary blindness one eye,  no slurred speech, no peripheral neuropathy, no chronic pain, no instability of gait, no memory/cognitive dysfunction             Musculoskeletal:         + arthritis, no joint swelling, no myalgias, no difficulty walking, normal mobility              Skin:                            no rash, no itching, no skin infections, no pressure sores or ulcerations             Psych:                         no anxiety, no depression, no nervousness, no unusual recent stress             Eyes:                           no blurry vision, no floaters, no recent vision changes,  wears glasses            ENT:                            + hearing loss, no loose or painful teeth, + dentures             Hematologic:               no easy bruising, no abnormal bleeding, no clotting disorder, no frequent epistaxis             Endocrine:                   no diabetes, does not check CBG's at home                            Physical Exam:               Ht 5' (1.524 m)   BMI 31.64 kg/m              General:                      well-appearing             HEENT:                       Unremarkable, NCAT, PERLA,  EOMI,              Neck:  no JVD, no bruits, no adenopathy              Chest:                          clear to auscultation, symmetrical breath sounds, no wheezes, no rhonchi              CV:                              RRR, 3/6 systolic murmur RSB             Abdomen:                    soft, non-tender, no masses              Extremities:                 warm, well-perfused, pulses palpable at ankle, no lower extremity edema             Rectal/GU                   Deferred             Neuro:                         Grossly non-focal and symmetrical throughout             Skin:                            Clean and dry, no rashes, no breakdown   Diagnostic Tests:   ECHOCARDIOGRAM REPORT         Patient Name:   CATHI SWEDENBURG Date of Exam: 09/09/2020  Medical Rec #:  [id number]     Height:       61.0 in  Accession #:    [id number]    Weight:       170.7 lb  Date of Birth:  [birth year]    BSA:          1.766 m  Patient Age:    25 years      BP:           135/70 mmHg  Patient Gender: F             HR:           40 bpm.  Exam Location:  [facility]   Procedure: 2D Echo, Cardiac Doppler and Color Doppler   Indications:    I35.0 Nonrheumatic aortic (valve) stenosis     History:        Patient has prior history of Echocardiogram examinations,  most                  recent 03/05/2020. Risk Factors:Hypertension and  Dyslipidemia.                  Obesity.     Sonographer:    Diamond Nickel RCS  Referring Phys: [place]     1. Left ventricular ejection fraction, by estimation, is 70 to 75%. The  left ventricle has hyperdynamic function. The left ventricle has no  regional wall motion abnormalities. There is mild concentric left  ventricular hypertrophy. Left ventricular  diastolic parameters are consistent with Grade  I diastolic dysfunction  (impaired relaxation). Elevated left atrial pressure.   2. Right ventricular systolic  function is normal. The right ventricular  size is normal.   3. The mitral valve is normal in structure. Trivial mitral valve  regurgitation. No evidence of mitral stenosis.   4. The aortic valve is tricuspid. There is severe calcification of the  aortic valve. There is severe thickening of the aortic valve. Aortic valve  regurgitation is mild. Severe aortic valve stenosis. Aortic valve area, by  VTI measures 0.67 cm. Aortic  valve mean gradient measures 42.5 mmHg. Aortic valve Vmax measures 4.57  m/s. Aortic valve acceleration time measures 130 msec.   5. The inferior vena cava is normal in size with greater than 50%  respiratory variability, suggesting right atrial pressure of 3 mmHg.   Comparison(s): No significant change from prior study. Prior images  reviewed side by side. Aortic stenosis remains severe.   FINDINGS   Left Ventricle: Left ventricular ejection fraction, by estimation, is 70  to 75%. The left ventricle has hyperdynamic function. The left ventricle  has no regional wall motion abnormalities. The left ventricular internal  cavity size was normal in size.  There is mild concentric left ventricular hypertrophy. Left ventricular  diastolic parameters are consistent with Grade I diastolic dysfunction  (impaired relaxation). Elevated left atrial pressure.   Right Ventricle: The right ventricular size is normal. No increase in  right ventricular wall thickness. Right ventricular systolic function is  normal.   Left Atrium: Left atrial size was normal in size.   Right Atrium: Right atrial size was normal in size.   Pericardium: There is no evidence of pericardial effusion.   Mitral Valve: The mitral valve is normal in structure. Trivial mitral  valve regurgitation. No evidence of mitral valve stenosis.   Tricuspid Valve: The tricuspid valve is normal in structure. Tricuspid  valve regurgitation is not demonstrated. No evidence of tricuspid  stenosis.   Aortic  Valve: The aortic valve is tricuspid. There is severe calcifcation  of the aortic valve. There is severe thickening of the aortic valve.  Aortic valve regurgitation is mild. Aortic regurgitation PHT measures 906  msec. Severe aortic stenosis is  present. Aortic valve mean gradient measures 42.5 mmHg. Aortic valve peak  gradient measures 83.5 mmHg. Aortic valve area, by VTI measures 0.67 cm.   Pulmonic Valve: The pulmonic valve was normal in structure. Pulmonic valve  regurgitation is trivial. No evidence of pulmonic stenosis.   Aorta: The aortic root is normal in size and structure.   Venous: The inferior vena cava is normal in size with greater than 50%  respiratory variability, suggesting right atrial pressure of 3 mmHg.   IAS/Shunts: No atrial level shunt detected by color flow Doppler.      LEFT VENTRICLE  PLAX 2D  LVIDd:         3.20 cm  Diastology  LVIDs:         1.60 cm  LV e' medial:    4.43 cm/s  LV PW:         1.20 cm  LV E/e' medial:  16.5  LV IVS:        1.50 cm  LV e' lateral:   4.87 cm/s  LVOT diam:     2.05 cm  LV E/e' lateral: 15.1  LV SV:         71  LV SV Index:   40  LVOT Area:     3.30 cm  RIGHT VENTRICLE  RV S prime:     8.18 cm/s  TAPSE (M-mode): 1.6 cm   LEFT ATRIUM             Index       RIGHT ATRIUM          Index  LA diam:        3.60 cm 2.04 cm/m  RA Area:     9.41 cm  LA Vol (A2C):   28.7 ml 16.25 ml/m RA Volume:   16.90 ml 9.57 ml/m  LA Vol (A4C):   13.1 ml 7.42 ml/m  LA Biplane Vol: 19.9 ml 11.27 ml/m   AORTIC VALVE  AV Area (Vmax):    0.66 cm  AV Area (Vmean):   0.75 cm  AV Area (VTI):     0.67 cm  AV Vmax:           456.85 cm/s  AV Vmean:          291.198 cm/s  AV VTI:            1.055 m  AV Peak Grad:      83.5 mmHg  AV Mean Grad:      42.5 mmHg  LVOT Vmax:         90.80 cm/s  LVOT Vmean:        66.300 cm/s  LVOT VTI:          0.214 m  LVOT/AV VTI ratio: 0.20  AI PHT:            906 msec     AORTA  Ao Root diam:  2.80 cm   MITRAL VALVE  MV Area (PHT): 2.54 cm     SHUNTS  MV Decel Time: 299 msec     Systemic VTI:  0.21 m  MV E velocity: 73.30 cm/s   Systemic Diam: 2.05 cm  MV A velocity: 118.00 cm/s  MV E/A ratio:  0.62   Mihai Croitoru MD  Electronically signed by Sanda Klein MD  Signature Date/Time: 09/09/2020/3:54:24 PM         Final       Physicians   Panel Physicians Referring Physician Case Authorizing Physician  Sherren Mocha, MD (Primary)          Procedures   RIGHT/LEFT HEART CATH AND CORONARY ANGIOGRAPHY      Conclusion       There is severe aortic valve stenosis.   1.  Patent coronary arteries with mild calcification and luminal irregularity, no significant stenoses 2.  Normal right heart pressures 3.  Critical aortic stenosis with mean gradient 78 mmHg, peak to peak gradient 86 mmHg, calculated aortic valve area 0.37 cm.   Recommendations: expedited workup for treatment of severe aortic stenosis           Procedural Details   Technical Details INDICATION: Severe symptomatic aortic stenosis  PROCEDURAL DETAILS: There was an indwelling IV in a left antecubital vein. Using normal sterile technique, the IV was changed out for a 5 Fr brachial sheath over a 0.018 inch wire. The right wrist was then prepped, draped, and anesthetized with 1% lidocaine. Using the modified Seldinger technique a 5/6 French Slender sheath was placed in the right radial artery. Intra-arterial verapamil was administered through the radial artery sheath. IV heparin was administered after a JR4 catheter was advanced into the central aorta. A Swan-Ganz catheter was used for the right heart catheterization. Standard protocol was followed for recording of right heart pressures and  sampling of oxygen saturations. Fick cardiac output was calculated. Standard Judkins catheters were used for selective coronary angiography. LV pressure is recorded and an aortic valve pullback is performed. There were  no immediate procedural complications. The patient was transferred to the post catheterization recovery area for further monitoring.      Estimated blood loss <50 mL.   During this procedure medications were administered to achieve and maintain moderate conscious sedation while the patient's heart rate, blood pressure, and oxygen saturation were continuously monitored and I was present face-to-face 100% of this time.      Medications (Filter: Administrations occurring from 1115 to 1224 on 10/21/20)  important  Continuous medications are totaled by the amount administered until 10/21/20 1224.      midazolam (VERSED) injection (mg) Total dose:  2 mg  Date/Time Rate/Dose/Volume Action    10/21/20 1149 2 mg Given      fentaNYL (SUBLIMAZE) injection (mcg) Total dose:  25 mcg  Date/Time Rate/Dose/Volume Action    10/21/20 1149 25 mcg Given      Heparin (Porcine) in NaCl 1000-0.9 UT/500ML-% SOLN (mL) Total volume:  1,000 mL  Date/Time Rate/Dose/Volume Action    10/21/20 1150 500 mL Given    1151 500 mL Given      lidocaine (PF) (XYLOCAINE) 1 % injection (mL) Total volume:  4 mL  Date/Time Rate/Dose/Volume Action    10/21/20 1151 2 mL Given    1158 2 mL Given      Radial Cocktail/Verapamil only (mL) Total volume:  10 mL  Date/Time Rate/Dose/Volume Action    10/21/20 1201 10 mL Given      heparin sodium (porcine) injection (Units) Total dose:  3,500 Units  Date/Time Rate/Dose/Volume Action    10/21/20 1205 3,500 Units Given      iohexol (OMNIPAQUE) 350 MG/ML injection (mL) Total volume:  55 mL  Date/Time Rate/Dose/Volume Action    10/21/20 1219 55 mL Given        Sedation Time   Sedation Time Physician-1: 26 minutes 10 seconds     Contrast   Medication Name Total Dose  iohexol (OMNIPAQUE) 350 MG/ML injection 55 mL      Radiation/Fluoro   Fluoro time: 6.9 (min) DAP: 10.8 (Gycm2) Cumulative Air Kerma: 213.3 (mGy)     Coronary Findings      Diagnostic Dominance: Right   Left Main  Vessel is angiographically normal.  Left Anterior Descending  The vessel exhibits minimal luminal irregularities.  Left Circumflex  Vessel is angiographically normal.  Right Coronary Artery  The vessel exhibits minimal luminal irregularities.    Intervention     No interventions have been documented.                   Left Heart   Aortic Valve There is severe aortic valve stenosis. The aortic valve is calcified. There is restricted aortic valve motion.      Coronary Diagrams     Diagnostic Dominance: Right      Intervention         Implants      No implant documentation for this case.      Syngo Images    Show images for CARDIAC CATHETERIZATION   Images on Long Term Storage    Show images for Baltes, Karenna J   Link to Procedure Log   Procedure Log        Hemo Data   Flowsheet Row Most Recent Value  Fick Cardiac Output 3 L/min  Fick  Cardiac Output Index 1.71 (L/min)/BSA  Aortic Mean Gradient 77.82 mmHg  Aortic Peak Gradient 86 mmHg  Aortic Valve Area 0.37  Aortic Value Area Index 0.21 cm2/BSA  RA A Wave 5 mmHg  RA V Wave 2 mmHg  RA Mean 2 mmHg  RV Systolic Pressure 29 mmHg  RV Diastolic Pressure 3 mmHg  RV EDP 5 mmHg  PA Systolic Pressure 31 mmHg  PA Diastolic Pressure 1 mmHg  PA Mean 15 mmHg  PW A Wave 9 mmHg  PW V Wave 8 mmHg  PW Mean 7 mmHg  AO Systolic Pressure [id number] mmHg  AO Diastolic Pressure 54 mmHg  AO Mean 77 mmHg  LV Systolic Pressure [id number] mmHg  LV Diastolic Pressure -1 mmHg  LV EDP 22 mmHg  AOp Systolic Pressure [id number] mmHg  AOp Diastolic Pressure 53 mmHg  AOp Mean Pressure 81 mmHg  LVp Systolic Pressure [id number] mmHg  LVp Diastolic Pressure 0 mmHg  LVp EDP Pressure 22 mmHg  QP/QS 1  TPVR Index 8.76 HRUI  TSVR Index 44.99 HRUI  PVR SVR Ratio 0.11  TPVR/TSVR Ratio 0.19        ADDENDUM REPORT: 10/29/2020 14:24   EXAM: OVER-READ INTERPRETATION  CT CHEST   The following  report is an over-read performed by radiologist Dr. Samara Snide [facility] on 10/29/2020. This over-read does not include interpretation of cardiac or coronary anatomy or pathology. The coronary CTA interpretation by the cardiologist is attached.   COMPARISON:  None.   FINDINGS: Please see the separate concurrent chest CT angiogram report for details.   IMPRESSION: Please see the separate concurrent chest CT angiogram report for details.     Electronically Signed   By: Ilona Sorrel M.D.   On: 10/29/2020 14:24    Addended by Sharyn Blitz, MD on 10/29/2020  2:59 PM      Study Result   Narrative & Impression  CLINICAL DATA:  Aortic stenosis   EXAM: Cardiac TAVR CT   TECHNIQUE: The patient was scanned on a Siemens Force [id number] slice scanner. A 120 kV retrospective scan was triggered in the descending thoracic aorta at 111 HU's. Gantry rotation speed was 270 msecs and collimation was .9 mm. No beta blockade or nitro were given. The 3D data set was reconstructed in 5% intervals of the R-R cycle. Systolic and diastolic phases were analyzed on a dedicated work station using MPR, MIP and VRT modes. The patient received 100 cc of contrast.   FINDINGS: Aortic Valve: Severely thickened aortic valve with heavy calcification and reduced excursion the planimeter valve area is 0.786 Sq cm consistent with severe aortic stenosis   Number of leaflets: 3   LVOT calcification: None   Annular calcification: Mild   Prosthetic Valve: NA   Left ventricle: LVEF 60% without evidence of wall motion abnormality   Aortic Annulus Measurements   Major annulus diameter: 25 mm   Minor annulus diameter:20 mm   Annular perimeter: 69 mm   Annular area: 3.7 sq cm   Aortic Root Measurements   Sinus of Valsalva perimeter: 92 mm   Sinotubular Junction: 28 mm   Ascending Thoracic Aorta: 38 mm   Aortic Arch: 29 mm   Descending Thoracic Aorta: 20 mm   Sinus of  Valsalva Measurements:   Right coronary cusp width: 24 mm   Left coronary cusp width: 29 mm   Non coronary cusp width: 29 mm   Coronary Artery Height above Annulus:   Left Main: 13 mm  Right Coronary: 12 mm   Optimum Fluoroscopic Angle for Delivery: LAO 21 CAU 0   Valves for structural team consideration: 26 mm CoreValve, 23 mm Edwards Sapien   IMPRESSION: 1. Severe Aortic stenosis. Findings pertinent to TAVR procedure are detailed above.   Mahesh  Chandrasekhar   Electronically Signed: By: Rudean Haskell MD On: 10/29/2020 14:11        Narrative & Impression  CLINICAL DATA:  Non rheumatic aortic stenosis.  TAVR planning.   EXAM: CT ANGIOGRAPHY CHEST, ABDOMEN AND PELVIS   TECHNIQUE: Multidetector CT imaging through the chest, abdomen and pelvis was performed using the standard protocol during bolus administration of intravenous contrast. Multiplanar reconstructed images and MIPs were obtained and reviewed to evaluate the vascular anatomy.   CONTRAST:  141m OMNIPAQUE IOHEXOL 350 MG/ML SOLN   COMPARISON:  None.   FINDINGS: CTA CHEST FINDINGS   Cardiovascular: Mild cardiomegaly. Diffuse thickening and coarse calcification of the aortic valve. No significant pericardial effusion/thickening. Atherosclerotic thoracic aorta with dilated 4.0 cm diameter ascending thoracic aorta. Normal caliber main pulmonary artery. No central pulmonary emboli.   Mediastinum/Nodes: Bilateral thyroid nodules, largest a hypodense 2.3 cm left thyroid nodule. Unremarkable esophagus. No pathologically enlarged axillary, mediastinal or hilar lymph nodes.   Lungs/Pleura: No pneumothorax. No pleural effusion. No acute consolidative airspace disease, lung masses or significant pulmonary nodules.   Musculoskeletal: No aggressive appearing focal osseous lesions. Mild thoracic spondylosis.   CTA ABDOMEN AND PELVIS FINDINGS   Hepatobiliary: Normal liver with no liver mass.  Cholecystectomy. No biliary ductal dilatation.   Pancreas: Normal, with no mass or duct dilation.   Spleen: Normal size. No mass.   Adrenals/Urinary Tract: Normal adrenals. No contour deforming renal masses. No hydronephrosis. Normal bladder.   Stomach/Bowel: Small hiatal hernia. Otherwise normal nondistended stomach. Normal caliber small bowel with no small bowel wall thickening. Appendectomy. Marked diffuse colonic diverticulosis, most prominent in the sigmoid colon, with no definite large bowel wall thickening or significant pericolonic fat stranding.   Vascular/Lymphatic: Atherosclerotic nonaneurysmal abdominal aorta. No pathologically enlarged lymph nodes in the abdomen or pelvis.   Reproductive: Status post hysterectomy, with no abnormal findings at the vaginal cuff. No adnexal mass.   Other: No pneumoperitoneum, ascites or focal fluid collection.   Musculoskeletal: No aggressive appearing focal osseous lesions. Marked lumbar spondylosis. Sclerosis throughout the L2 vertebral body with associated trabecular thickening, suggesting Paget's disease.   VASCULAR MEASUREMENTS PERTINENT TO TAVR:   AORTA:   Minimal Aortic Diameter-10.8 x 10.3 mm   Severity of Aortic Calcification-mild-to-moderate   RIGHT PELVIS:   Right Common Iliac Artery -   Minimal Diameter-6.8 x 5.5 mm   Tortuosity-mild   Calcification-mild-to-moderate   Right External Iliac Artery -   Minimal Diameter-5.6 x 5.4 mm   Tortuosity-mild-to-moderate   Calcification-none   Right Common Femoral Artery -   Minimal Diameter-6.1 x 6.0 mm   Tortuosity-mild   Calcification-none   LEFT PELVIS:   Left Common Iliac Artery -   Minimal Diameter-7.5 x 6.8 mm   Tortuosity-mild-to-moderate   Calcification-mild   Left External Iliac Artery -   Minimal Diameter-5.9 x 5.5 mm   Tortuosity-mild to moderate   Calcification-none   Left Common Femoral Artery -   Minimal Diameter-6.5 x 6.5 mm    Tortuosity-mild   Calcification-none   Review of the MIP images confirms the above findings.   IMPRESSION: 1. Vascular findings and measurements pertinent to potential TAVR procedure, as detailed. 2. Diffuse thickening and coarse calcification of the  aortic valve, compatible with reported aortic stenosis. 3. Bilateral thyroid nodules, largest 2.3 cm on the left. Recommend thyroid US (ref: J Am Coll Radiol. 2015 Feb;12(2): 143-50). 4. Mild cardiomegaly. 5. Marked diffuse colonic diverticulosis. 6. Small hiatal hernia. 7. Sclerosis throughout the L2 vertebral body with associated trabecular thickening, suggesting Paget's disease. 8. Aortic Atherosclerosis (ICD10-I70.0).     Electronically Signed   By: Ilona Sorrel M.D.   On: 10/29/2020 14:23                      STS Risk:   Procedure: Isolated AVR  Risk of Mortality:  1.572%  Renal Failure:  0.994%  Permanent Stroke:  1.304%  Prolonged Ventilation:  3.875%  DSW Infection:  0.078%  Reoperation:  2.860%  Morbidity or Mortality:  7.051%  Short Length of Stay:  47.653%  Long Length of Stay:  2.864%    Impression:   This 75 year old woman has stage D, severe, symptomatic aortic stenosis with New York Heart Association class II symptoms of exertional fatigue and shortness of breath consistent with chronic diastolic congestive heart failure.  She has also developed episodes of dizziness and chest discomfort with exertion.  She is still very active out working in her yard.  I have personally reviewed her 2D echocardiogram, cardiac catheterization, and CTA studies.  Her echo shows a trileaflet aortic valve with severe calcification and thickening of the leaflets with restricted mobility.  The mean gradient across her aortic valve was 42.5 mmHg with a valve area of 0.67 cm consistent with severe aortic stenosis.  Left ventricular systolic function is normal.  Cardiac catheterization showed no significant coronary stenoses.   The mean gradient across aortic valve was measured at 78 mmHg with a peak to peak gradient of 86 mmHg and a valve area of 0.37 cm consistent with critical aortic stenosis.  I agree that aortic valve replacement is indicated in this patient for relief of her symptoms and to prevent progressive left ventricular deterioration and sudden death.  Given her age I think transcatheter aortic valve replacement would be the best option for her.  Her gated cardiac CTA shows anatomy suitable for TAVR using a 23 mm Edwards SAPIEN 3 valve.  Her abdominal and pelvic CTA shows adequate pelvic vascular anatomy to allow transfemoral insertion.   The patient and her daughter were counseled at length regarding treatment alternatives for management of severe symptomatic aortic stenosis. The risks and benefits of surgical intervention has been discussed in detail. Long-term prognosis with medical therapy was discussed. Alternative approaches such as conventional surgical aortic valve replacement, transcatheter aortic valve replacement, and palliative medical therapy were compared and contrasted at length. This discussion was placed in the context of the patient's own specific clinical presentation and past medical history. All of their questions have been addressed.    Following the decision to proceed with transcatheter aortic valve replacement, a discussion was held regarding what types of management strategies would be attempted intraoperatively in the event of life-threatening complications, including whether or not the patient would be considered a candidate for the use of cardiopulmonary bypass and/or conversion to open sternotomy for attempted surgical intervention.  I think she would certainly be a candidate for emergent sternotomy to manage any intraoperative complications.  The patient is aware of the fact that transient use of cardiopulmonary bypass may be necessary. The patient has been advised of a variety of  complications that might develop including but not limited to risks of death,  stroke, paravalvular leak, aortic dissection or other major vascular complications, aortic annulus rupture, device embolization, cardiac rupture or perforation, mitral regurgitation, acute myocardial infarction, arrhythmia, heart block or bradycardia requiring permanent pacemaker placement, congestive heart failure, respiratory failure, renal failure, pneumonia, infection, other late complications related to structural valve deterioration or migration, or other complications that might ultimately cause a temporary or permanent loss of functional independence or other long term morbidity. The patient provides full informed consent for the procedure as described and all questions were answered.       Plan:   Transfemoral transcatheter aortic valve replacement.      Gaye Pollack, MD

## 2020-11-16 NOTE — Progress Notes (Signed)
Anesthesia Chart Review:  Case: [id number] Date/Time: 11/17/20 0715   Procedures:      TRANSCATHETER AORTIC VALVE REPLACEMENT, TRANSFEMORAL (Chest)     TRANSESOPHAGEAL ECHOCARDIOGRAM (TEE)   Anesthesia type: Monitor Anesthesia Care   Pre-op diagnosis: Severe Aortic Stenosis   Location: [facility] OR ROOM 16 / [place] [facility]   Surgeons: Sherren Mocha, MD     CT Surgeon: Gilford Raid, MD   DISCUSSION: Patient is a 75 year old female scheduled for the above procedure.  History includes never smoker, murmur/severe AS, HTN, HLD.  Minimal luminal irregularities in the LAD and RCA by 10/03/20 cath.  BMI is consistent with obesity.  11/13/2020 presurgical COVID-19 test negative.  Anesthesia team to evaluate on the day of surgery.  She needs ABG on arrival.   VS: BP (!) 152/71   Pulse 72   Temp 36.8 C (Oral)   Resp 18   Ht 5' (1.524 m)   Wt 75.5 kg   SpO2 96%   BMI 32.52 kg/m   PROVIDERS: Ronnell Freshwater, NP is PCP Croitoru, Dani Gobble, MD is primary cardiologist   LABS: Labs reviewed: Acceptable for surgery. Cardiology has reviewed UA results. Patient asymptomatic, so antibiotics not felt indicated.  (all labs ordered are listed, but only abnormal results are displayed)  Labs Reviewed  SURGICAL PCR SCREEN - Abnormal; Notable for the following components:      Result Value   Staphylococcus aureus POSITIVE (*)    All other components within normal limits  CBC - Abnormal; Notable for the following components:   WBC 11.6 (*)    RBC 5.30 (*)    Hemoglobin 16.6 (*)    HCT 48.6 (*)    All other components within normal limits  COMPREHENSIVE METABOLIC PANEL - Abnormal; Notable for the following components:   Glucose, Bld 102 (*)    Creatinine, Ser 1.05 (*)    GFR, Estimated 56 (*)    All other components within normal limits  URINALYSIS, ROUTINE W REFLEX MICROSCOPIC - Abnormal; Notable for the following components:   APPearance HAZY (*)    Leukocytes,Ua LARGE (*)    Bacteria, UA RARE (*)    All  other components within normal limits  SARS CORONAVIRUS 2 (TAT 6-24 HRS)  PROTIME-INR  TYPE AND SCREEN     IMAGES: CXR 11/13/20: FINDINGS: Lungs are adequately inflated without focal airspace consolidation or effusion. Cardiomediastinal silhouette, bones and soft tissues are unremarkable. IMPRESSION: No active cardiopulmonary disease.   CTA chest/abd/pelvis 10/29/20: IMPRESSION: 1. Vascular findings and measurements pertinent to potential TAVR procedure, as detailed. 2. Diffuse thickening and coarse calcification of the aortic valve, compatible with reported aortic stenosis. 3. Bilateral thyroid nodules, largest 2.3 cm on the left. Recommend thyroid US (ref: J Am Coll Radiol. 2015 Feb;12(2): 143-50). 4. Mild cardiomegaly. 5. Marked diffuse colonic diverticulosis. 6. Small hiatal hernia. 7. Sclerosis throughout the L2 vertebral body with associated trabecular thickening, suggesting Paget's disease. 8. Aortic Atherosclerosis (ICD10-I70.0).   EKG: 8/12//22 Normal sinus rhythm Left ventricular hypertrophy with repolarization abnormality ( Cornell product ) Anterior infarct , age undetermined Inferior infarct , age undetermined Abnormal ECG No significant change since last tracing Confirmed by [place], Peter [phone]) on 11/13/2020 4:06:24 PM   CV: CT Coronary 10/29/20: IMPRESSION: 1. Severe Aortic stenosis. Findings pertinent to TAVR procedure are detailed above. [See Results tab for full report]   Carotid US 10/29/20: Summary:  - Right Carotid: The extracranial vessels were near-normal with only minimal  wall thickening or plaque.  - Left Carotid:  The extracranial vessels were near-normal with only minimal  wall thickening or plaque.  - Vertebrals:  Bilateral vertebral arteries demonstrate antegrade flow.  - Subclavians: Normal flow hemodynamics were seen in bilateral subclavian arteries.    Cardiac cath 10/21/20:   There is severe aortic valve stenosis.   1.   Patent coronary arteries with mild calcification and luminal irregularity, no significant stenoses 2.  Normal right heart pressures 3.  Critical aortic stenosis with mean gradient 78 mmHg, peak to peak gradient 86 mmHg, calculated aortic valve area 0.37 cm.   Recommendations: expedited workup for treatment of severe aortic stenosis   Echo 09/09/20: IMPRESSIONS   1. Left ventricular ejection fraction, by estimation, is 70 to 75%. The  left ventricle has hyperdynamic function. The left ventricle has no  regional wall motion abnormalities. There is mild concentric left  ventricular hypertrophy. Left ventricular  diastolic parameters are consistent with Grade I diastolic dysfunction  (impaired relaxation). Elevated left atrial pressure.   2. Right ventricular systolic function is normal. The right ventricular  size is normal.   3. The mitral valve is normal in structure. Trivial mitral valve  regurgitation. No evidence of mitral stenosis.   4. The aortic valve is tricuspid. There is severe calcification of the  aortic valve. There is severe thickening of the aortic valve. Aortic valve  regurgitation is mild. Severe aortic valve stenosis. Aortic valve area, by  VTI measures 0.67 cm. Aortic  valve mean gradient measures 42.5 mmHg. Aortic valve Vmax measures 4.57  m/s. Aortic valve acceleration time measures 130 msec.   5. The inferior vena cava is normal in size with greater than 50%  respiratory variability, suggesting right atrial pressure of 3 mmHg.   Comparison(s): No significant change from prior study. Prior images  reviewed side by side. Aortic stenosis remains severe.   Past Medical History:  Diagnosis Date   Aortic valve sclerosis    Coronary atherosclerosis    without significant obstruction   Heart murmur    History of kidney stones    History of nuclear stress test 07/12/2010   bruce protocol myoview; normal pattern of perfusion, low risk, post-stress EF 92%    Hyperlipidemia    Hypertension     Past Surgical History:  Procedure Laterality Date   ABDOMINAL HYSTERECTOMY     fibroids; ovaries intact.   APPENDECTOMY     BILATERAL CARPAL TUNNEL RELEASE Bilateral    CARDIAC CATHETERIZATION     moderate stenosis in 1st diagonal & RCA   CESAREAN SECTION     CHOLECYSTECTOMY     EYE SURGERY Bilateral    cataracts   INNER EAR SURGERY Right    LITHOTRIPSY Right    RIGHT/LEFT HEART CATH AND CORONARY ANGIOGRAPHY N/A 10/21/2020   Procedure: RIGHT/LEFT HEART CATH AND CORONARY ANGIOGRAPHY;  Surgeon: Sherren Mocha, MD;  Location: [facility];  Service: Cardiovascular;  Laterality: N/A;   TRANSTHORACIC ECHOCARDIOGRAM  07/12/2010   EF=>55% with normal systolic function; trace MR/TR/PR    MEDICATIONS:  Ascorbic Acid (VITAMIN C) 1000 MG tablet   aspirin EC 81 MG tablet   atorvastatin (LIPITOR) 80 MG tablet   Carboxymeth-Glycerin-Polysorb (REFRESH OPTIVE ADVANCED) 0.5-1-0.5 % SOLN   Carboxymethylcellul-Glycerin (REFRESH OPTIVE) 1-0.9 % GEL   Cholecalciferol (VITAMIN D3) 25 MCG (1000 UT) CAPS   Coenzyme Q10 (COQ10) 100 MG CAPS   fish oil-omega-3 fatty acids 1000 MG capsule   meloxicam (MOBIC) 7.5 MG tablet   Multiple Vitamins-Minerals (PRESERVISION AREDS 2 PO)  oxybutynin (DITROPAN) 5 MG tablet   valsartan (DIOVAN) 80 MG tablet   No current facility-administered medications for this encounter.    [START ON 11/17/2020] ceFAZolin (ANCEF) IVPB 2g/100 mL premix   [START ON 11/17/2020] dexmedetomidine (PRECEDEX) 400 MCG/100ML (4 mcg/mL) infusion   [START ON 11/17/2020] heparin 30,000 units/NS 1000 mL solution for CELLSAVER   [START ON 11/17/2020] magnesium sulfate (IV Push/IM) injection 40 mEq   [START ON 11/17/2020] norepinephrine (LEVOPHED) '4mg'$  in 243m premix infusion   [START ON 11/17/2020] potassium chloride injection 80 mEq    AMyra Gianotti PA-C Surgical Short Stay/Anesthesiology MWest [facility] CenterPhone ([phone] Lake Surgery Center LLCPhone [phone]/15/2022 10:11 AM

## 2020-11-16 NOTE — Anesthesia Preprocedure Evaluation (Addendum)
Anesthesia Evaluation  Patient identified by MRN, date of birth, ID band Patient awake    Reviewed: Allergy & Precautions, NPO status , Patient's Chart, lab work & pertinent test results  History of Anesthesia Complications Negative for: history of anesthetic complications  Airway Mallampati: II  TM Distance: >3 FB Neck ROM: Full    Dental  (+) Dental Advisory Given, Lower Dentures, Upper Dentures   Pulmonary neg pulmonary ROS,    Pulmonary exam normal breath sounds clear to auscultation       Cardiovascular hypertension, Pt. on medications + CAD  + Valvular Problems/Murmurs AS  Rhythm:Regular Rate:Normal + Systolic murmurs    Neuro/Psych negative neurological ROS  negative psych ROS   GI/Hepatic negative GI ROS, Neg liver ROS,   Endo/Other  Obesity   Renal/GU negative Renal ROS     Musculoskeletal negative musculoskeletal ROS (+)   Abdominal   Peds  Hematology negative hematology ROS (+)   Anesthesia Other Findings Day of surgery medications reviewed with the patient.  Reproductive/Obstetrics                           Anesthesia Physical Anesthesia Plan  ASA: 4  Anesthesia Plan: MAC   Post-op Pain Management:    Induction: Intravenous  PONV Risk Score and Plan: 2 and Propofol infusion and Midazolam  Airway Management Planned: Nasal Cannula and Natural Airway  Additional Equipment: Arterial line  Intra-op Plan:   Post-operative Plan:   Informed Consent: I have reviewed the patients History and Physical, chart, labs and discussed the procedure including the risks, benefits and alternatives for the proposed anesthesia with the patient or authorized representative who has indicated his/her understanding and acceptance.     Dental advisory given  Plan Discussed with: CRNA and Anesthesiologist  Anesthesia Plan Comments: (PAT note written 11/16/2020 by Myra Gianotti,  PA-C. )       Anesthesia Quick Evaluation

## 2020-11-17 ENCOUNTER — Other Ambulatory Visit: Payer: Self-pay | Admitting: Physician Assistant

## 2020-11-17 ENCOUNTER — Inpatient Hospital Stay (HOSPITAL_COMMUNITY)
Admission: RE | Admit: 2020-11-17 | Discharge: 2020-11-18 | DRG: 267 | Disposition: A | Discharge status: Home / Self Care | Payer: Medicare Other | Attending: Cardiovascular Disease | Admitting: Cardiovascular Disease

## 2020-11-17 ENCOUNTER — Other Ambulatory Visit: Payer: Self-pay

## 2020-11-17 ENCOUNTER — Encounter (HOSPITAL_COMMUNITY): Payer: Self-pay | Admitting: Cardiovascular Disease

## 2020-11-17 ENCOUNTER — Inpatient Hospital Stay (HOSPITAL_COMMUNITY): Payer: Medicare Other | Admitting: Anesthesiology

## 2020-11-17 ENCOUNTER — Encounter: Payer: Self-pay | Admitting: Physician Assistant

## 2020-11-17 ENCOUNTER — Encounter (HOSPITAL_COMMUNITY)
Admission: RE | Disposition: A | Discharge status: Home / Self Care | Payer: Self-pay | Source: Home / Self Care | Attending: Cardiovascular Disease

## 2020-11-17 ENCOUNTER — Inpatient Hospital Stay (HOSPITAL_COMMUNITY): Payer: Medicare Other | Admitting: Vascular Surgery

## 2020-11-17 ENCOUNTER — Inpatient Hospital Stay (HOSPITAL_COMMUNITY): Payer: Medicare Other

## 2020-11-17 DIAGNOSIS — Z8249 Family history of ischemic heart disease and other diseases of the circulatory system: Secondary | ICD-10-CM | POA: Diagnosis not present

## 2020-11-17 DIAGNOSIS — Z87442 Personal history of urinary calculi: Secondary | ICD-10-CM

## 2020-11-17 DIAGNOSIS — I350 Nonrheumatic aortic (valve) stenosis: Secondary | ICD-10-CM

## 2020-11-17 DIAGNOSIS — Z888 Allergy status to other drugs, medicaments and biological substances status: Secondary | ICD-10-CM

## 2020-11-17 DIAGNOSIS — I2510 Atherosclerotic heart disease of native coronary artery without angina pectoris: Secondary | ICD-10-CM | POA: Diagnosis present

## 2020-11-17 DIAGNOSIS — Z006 Encounter for examination for normal comparison and control in clinical research program: Secondary | ICD-10-CM

## 2020-11-17 DIAGNOSIS — Z79899 Other long term (current) drug therapy: Secondary | ICD-10-CM

## 2020-11-17 DIAGNOSIS — Z90710 Acquired absence of both cervix and uterus: Secondary | ICD-10-CM | POA: Diagnosis not present

## 2020-11-17 DIAGNOSIS — Z9049 Acquired absence of other specified parts of digestive tract: Secondary | ICD-10-CM

## 2020-11-17 DIAGNOSIS — Z952 Presence of prosthetic heart valve: Secondary | ICD-10-CM

## 2020-11-17 DIAGNOSIS — I352 Nonrheumatic aortic (valve) stenosis with insufficiency: Secondary | ICD-10-CM | POA: Diagnosis not present

## 2020-11-17 DIAGNOSIS — Z7982 Long term (current) use of aspirin: Secondary | ICD-10-CM | POA: Diagnosis not present

## 2020-11-17 DIAGNOSIS — Z6831 Body mass index (BMI) 31.0-31.9, adult: Secondary | ICD-10-CM | POA: Diagnosis not present

## 2020-11-17 DIAGNOSIS — E7800 Pure hypercholesterolemia, unspecified: Secondary | ICD-10-CM | POA: Diagnosis not present

## 2020-11-17 DIAGNOSIS — E6609 Other obesity due to excess calories: Secondary | ICD-10-CM | POA: Diagnosis not present

## 2020-11-17 DIAGNOSIS — I10 Essential (primary) hypertension: Secondary | ICD-10-CM | POA: Diagnosis present

## 2020-11-17 HISTORY — PX: TEE WITHOUT CARDIOVERSION: SHX5443

## 2020-11-17 HISTORY — PX: TRANSCATHETER AORTIC VALVE REPLACEMENT, TRANSFEMORAL: SHX6400

## 2020-11-17 HISTORY — DX: Presence of prosthetic heart valve: Z95.2

## 2020-11-17 HISTORY — DX: Nonrheumatic aortic (valve) stenosis: I35.0

## 2020-11-17 LAB — ABO/RH: ABO/RH(D): O POS

## 2020-11-17 LAB — ECHOCARDIOGRAM LIMITED
AR max vel: 3.12 cm2
AV Area VTI: 2.35 cm2
AV Area mean vel: 2.71 cm2
AV Mean grad: 2 mmHg
AV Peak grad: 3.4 mmHg
Ao pk vel: 0.93 m/s

## 2020-11-17 LAB — POCT I-STAT, CHEM 8
BUN: 19 mg/dL (ref 8–23)
BUN: 19 mg/dL (ref 8–23)
Calcium, Ion: 1.21 mmol/L (ref 1.15–1.40)
Calcium, Ion: 1.25 mmol/L (ref 1.15–1.40)
Chloride: 108 mmol/L (ref 98–111)
Chloride: 107 mmol/L (ref 98–111)
Creatinine, Ser: 0.8 mg/dL (ref 0.44–1.00)
Creatinine, Ser: 0.8 mg/dL (ref 0.44–1.00)
Glucose, Bld: 158 mg/dL — ABNORMAL HIGH (ref 70–99)
Glucose, Bld: 128 mg/dL — ABNORMAL HIGH (ref 70–99)
HCT: 37 % (ref 36.0–46.0)
HCT: 36 % (ref 36.0–46.0)
Hemoglobin: 12.6 g/dL (ref 12.0–15.0)
Hemoglobin: 12.2 g/dL (ref 12.0–15.0)
Potassium: 3.7 mmol/L (ref 3.5–5.1)
Potassium: 3.6 mmol/L (ref 3.5–5.1)
Sodium: 142 mmol/L (ref 135–145)
Sodium: 141 mmol/L (ref 135–145)
TCO2: 22 mmol/L (ref 22–32)
TCO2: 22 mmol/L (ref 22–32)

## 2020-11-17 LAB — BLOOD GAS, ARTERIAL
Acid-base deficit: 0.9 mmol/L (ref 0.0–2.0)
Bicarbonate: 23 mmol/L (ref 20.0–28.0)
FIO2: 21
O2 Saturation: 96.5 %
Patient temperature: 37
pCO2 arterial: 36.2 mmHg (ref 32.0–48.0)
pH, Arterial: 7.419 (ref 7.350–7.450)
pO2, Arterial: 84.1 mmHg (ref 83.0–108.0)

## 2020-11-17 SURGERY — IMPLANTATION, AORTIC VALVE, TRANSCATHETER, FEMORAL APPROACH
Anesthesia: Monitor Anesthesia Care | Site: Chest

## 2020-11-17 MED ORDER — MORPHINE SULFATE (PF) 2 MG/ML IV SOLN: 1.0000 mg | INTRAVENOUS | Status: DC | PRN

## 2020-11-17 MED ORDER — PROPOFOL 10 MG/ML IV BOLUS
INTRAVENOUS | Status: DC | PRN
  Administered 2020-11-17: 20 mg via INTRAVENOUS

## 2020-11-17 MED ORDER — OXYBUTYNIN CHLORIDE 5 MG PO TABS
5.0000 mg | ORAL_TABLET | Freq: Two times a day (BID) | ORAL | Status: DC
  Administered 2020-11-17 – 2020-11-18 (×2): 5 mg via ORAL
  Filled 2020-11-17 (×4): qty 1

## 2020-11-17 MED ORDER — FENTANYL CITRATE (PF) 250 MCG/5ML IJ SOLN
INTRAMUSCULAR | Status: AC
  Filled 2020-11-17: qty 5

## 2020-11-17 MED ORDER — PHENYLEPHRINE HCL-NACL 20-0.9 MG/250ML-% IV SOLN
0.0000 ug/min | INTRAVENOUS | Status: DC
  Filled 2020-11-17: qty 250

## 2020-11-17 MED ORDER — ACETAMINOPHEN 650 MG RE SUPP: 650.0000 mg | Freq: Four times a day (QID) | RECTAL | Status: DC | PRN

## 2020-11-17 MED ORDER — SODIUM CHLORIDE 0.9 % IV SOLN: 250.0000 mL | INTRAVENOUS | Status: DC

## 2020-11-17 MED ORDER — HEPARIN SODIUM (PORCINE) 1000 UNIT/ML IJ SOLN
INTRAMUSCULAR | Status: DC | PRN
  Administered 2020-11-17: 12000 [IU] via INTRAVENOUS

## 2020-11-17 MED ORDER — SODIUM CHLORIDE 0.9 % IV SOLN: INTRAVENOUS | Status: AC

## 2020-11-17 MED ORDER — CHLORHEXIDINE GLUCONATE 0.12 % MT SOLN
15.0000 mL | Freq: Once | OROMUCOSAL | Status: AC
  Administered 2020-11-17: 15 mL via OROMUCOSAL
  Filled 2020-11-17: qty 15

## 2020-11-17 MED ORDER — IODIXANOL 320 MG/ML IV SOLN
INTRAVENOUS | Status: DC | PRN
  Administered 2020-11-17: 100 mL

## 2020-11-17 MED ORDER — 0.9 % SODIUM CHLORIDE (POUR BTL) OPTIME
TOPICAL | Status: DC | PRN
  Administered 2020-11-17: 1000 mL

## 2020-11-17 MED ORDER — OXYCODONE HCL 5 MG PO TABS: 5.0000 mg | ORAL_TABLET | ORAL | Status: DC | PRN

## 2020-11-17 MED ORDER — SODIUM CHLORIDE 0.9 % IV SOLN: INTRAVENOUS | Status: DC

## 2020-11-17 MED ORDER — ONDANSETRON HCL 4 MG/2ML IJ SOLN: 4.0000 mg | Freq: Four times a day (QID) | INTRAMUSCULAR | Status: DC | PRN

## 2020-11-17 MED ORDER — MIDAZOLAM HCL 2 MG/2ML IJ SOLN
INTRAMUSCULAR | Status: AC
  Filled 2020-11-17: qty 2

## 2020-11-17 MED ORDER — SODIUM CHLORIDE 0.9 % IV SOLN
INTRAVENOUS | Status: DC | PRN
  Administered 2020-11-17: 1500 mL via INTRAMUSCULAR

## 2020-11-17 MED ORDER — HEPARIN SODIUM (PORCINE) 1000 UNIT/ML IJ SOLN
INTRAMUSCULAR | Status: AC
  Filled 2020-11-17: qty 1

## 2020-11-17 MED ORDER — SODIUM CHLORIDE 0.9% FLUSH: 3.0000 mL | INTRAVENOUS | Status: DC | PRN

## 2020-11-17 MED ORDER — MIDAZOLAM HCL 2 MG/2ML IJ SOLN
INTRAMUSCULAR | Status: DC | PRN
  Administered 2020-11-17: 2 mg via INTRAVENOUS

## 2020-11-17 MED ORDER — NITROGLYCERIN IN D5W 200-5 MCG/ML-% IV SOLN
0.0000 ug/min | INTRAVENOUS | Status: DC
Start: 2020-11-17 — End: 2020-11-18

## 2020-11-17 MED ORDER — SODIUM CHLORIDE 0.9 % IV SOLN: Freq: Once | INTRAVENOUS | Status: AC

## 2020-11-17 MED ORDER — CHLORHEXIDINE GLUCONATE 4 % EX LIQD: 30.0000 mL | CUTANEOUS | Status: DC

## 2020-11-17 MED ORDER — LIDOCAINE HCL 1 % IJ SOLN
INTRAMUSCULAR | Status: DC | PRN
  Administered 2020-11-17: 10 mL

## 2020-11-17 MED ORDER — ASPIRIN EC 81 MG PO TBEC
81.0000 mg | DELAYED_RELEASE_TABLET | Freq: Every day | ORAL | Status: DC
  Administered 2020-11-17: 81 mg via ORAL
  Filled 2020-11-17: qty 1

## 2020-11-17 MED ORDER — LACTATED RINGERS IV SOLN
INTRAVENOUS | Status: DC | PRN
Start: 2020-11-17 — End: 2020-11-17

## 2020-11-17 MED ORDER — PROTAMINE SULFATE 10 MG/ML IV SOLN
INTRAVENOUS | Status: DC | PRN
  Administered 2020-11-17: 120 mg via INTRAVENOUS

## 2020-11-17 MED ORDER — FENTANYL CITRATE (PF) 100 MCG/2ML IJ SOLN
INTRAMUSCULAR | Status: DC | PRN
  Administered 2020-11-17: 50 ug via INTRAVENOUS
  Administered 2020-11-17: 25 ug via INTRAVENOUS

## 2020-11-17 MED ORDER — CHLORHEXIDINE GLUCONATE 4 % EX LIQD: 60.0000 mL | Freq: Once | CUTANEOUS | Status: DC

## 2020-11-17 MED ORDER — CEFAZOLIN SODIUM-DEXTROSE 2-4 GM/100ML-% IV SOLN
2.0000 g | Freq: Three times a day (TID) | INTRAVENOUS | Status: AC
Start: 2020-11-17 — End: 2020-11-17
  Administered 2020-11-17 (×2): 2 g via INTRAVENOUS
  Filled 2020-11-17 (×3): qty 100

## 2020-11-17 MED ORDER — HEPARIN 6000 UNIT IRRIGATION SOLUTION
Status: AC
  Filled 2020-11-17: qty 1500

## 2020-11-17 MED ORDER — ACETAMINOPHEN 500 MG PO TABS
1000.0000 mg | ORAL_TABLET | Freq: Once | ORAL | Status: AC
  Administered 2020-11-17: 1000 mg via ORAL
  Filled 2020-11-17: qty 2

## 2020-11-17 MED ORDER — TRAMADOL HCL 50 MG PO TABS: 50.0000 mg | ORAL_TABLET | ORAL | Status: DC | PRN

## 2020-11-17 MED ORDER — CLOPIDOGREL BISULFATE 75 MG PO TABS
75.0000 mg | ORAL_TABLET | Freq: Every day | ORAL | Status: DC
  Administered 2020-11-18: 75 mg via ORAL
  Filled 2020-11-17: qty 1

## 2020-11-17 MED ORDER — SODIUM CHLORIDE 0.9% FLUSH
3.0000 mL | Freq: Two times a day (BID) | INTRAVENOUS | Status: DC
Start: 2020-11-17 — End: 2020-11-18
  Administered 2020-11-18: 3 mL via INTRAVENOUS

## 2020-11-17 MED ORDER — PROTAMINE SULFATE 10 MG/ML IV SOLN
INTRAVENOUS | Status: AC
  Filled 2020-11-17: qty 25

## 2020-11-17 MED ORDER — SODIUM CHLORIDE 0.9 % IV SOLN: 250.0000 mL | INTRAVENOUS | Status: DC | PRN

## 2020-11-17 MED ORDER — ACETAMINOPHEN 325 MG PO TABS
650.0000 mg | ORAL_TABLET | Freq: Four times a day (QID) | ORAL | Status: DC | PRN
  Administered 2020-11-17: 650 mg via ORAL
  Filled 2020-11-17: qty 2

## 2020-11-17 MED ORDER — LACTATED RINGERS IV SOLN: INTRAVENOUS | Status: DC | PRN

## 2020-11-17 MED ORDER — LIDOCAINE HCL (PF) 1 % IJ SOLN
INTRAMUSCULAR | Status: AC
  Filled 2020-11-17: qty 30

## 2020-11-17 MED ORDER — ATORVASTATIN CALCIUM 80 MG PO TABS
80.0000 mg | ORAL_TABLET | Freq: Every day | ORAL | Status: DC
  Administered 2020-11-17 – 2020-11-18 (×2): 80 mg via ORAL
  Filled 2020-11-17 (×2): qty 1

## 2020-11-17 MED ORDER — PROPOFOL 10 MG/ML IV BOLUS
INTRAVENOUS | Status: AC
  Filled 2020-11-17: qty 20

## 2020-11-17 SURGICAL SUPPLY — 91 items
ADH SKN CLS APL DERMABOND .7 (GAUZE/BANDAGES/DRESSINGS) ×2
APL PRP STRL LF DISP 70% ISPRP (MISCELLANEOUS) ×2
BAG COUNTER SPONGE SURGICOUNT (BAG) ×3 IMPLANT
BAG DECANTER FOR FLEXI CONT (MISCELLANEOUS) IMPLANT
BAG SNAP BAND KOVER 36X36 (MISCELLANEOUS) ×4 IMPLANT
BAG SPNG CNTER NS LX DISP (BAG) ×2
BAG SURGICOUNT SPONGE COUNTING (BAG) ×1
BLADE CLIPPER SURG (BLADE) IMPLANT
BLADE STERNUM SYSTEM 6 (BLADE) IMPLANT
BLADE SURG 10 STRL SS (BLADE) IMPLANT
CABLE ADAPT CONN TEMP 6FT (ADAPTER) ×4 IMPLANT
CANISTER SUCT 3000ML PPV (MISCELLANEOUS) IMPLANT
CATH DIAG EXPO 6F AL1 (CATHETERS) IMPLANT
CATH DIAG EXPO 6F VENT PIG 145 (CATHETERS) ×8 IMPLANT
CATH EXTERNAL FEMALE PUREWICK (CATHETERS) IMPLANT
CATH INFINITI 6F AL2 (CATHETERS) IMPLANT
CATH S G BIP PACING (CATHETERS) ×4 IMPLANT
CHLORAPREP W/TINT 26 (MISCELLANEOUS) ×4 IMPLANT
CLIP VESOCCLUDE MED 24/CT (CLIP) IMPLANT
CLIP VESOCCLUDE SM WIDE 24/CT (CLIP) IMPLANT
CLOSURE MYNX CONTROL 6F/7F (Vascular Products) ×2 IMPLANT
CLOSURE PERCLOSE PROSTYLE (VASCULAR PRODUCTS) ×2 IMPLANT
CNTNR URN SCR LID CUP LEK RST (MISCELLANEOUS) ×4 IMPLANT
CONT SPEC 4OZ STRL OR WHT (MISCELLANEOUS) ×8
COVER BACK TABLE 80X110 HD (DRAPES) IMPLANT
DECANTER SPIKE VIAL GLASS SM (MISCELLANEOUS) ×4 IMPLANT
DERMABOND ADVANCED (GAUZE/BANDAGES/DRESSINGS) ×2
DERMABOND ADVANCED .7 DNX12 (GAUZE/BANDAGES/DRESSINGS) ×2 IMPLANT
DEVICE CLOSURE PERCLS PRGLD 6F (VASCULAR PRODUCTS) ×4 IMPLANT
DRAPE INCISE IOBAN 66X45 STRL (DRAPES) IMPLANT
DRSG TEGADERM 4X4.75 (GAUZE/BANDAGES/DRESSINGS) ×4 IMPLANT
ELECT CAUTERY BLADE 6.4 (BLADE) IMPLANT
ELECT REM PT RETURN 9FT ADLT (ELECTROSURGICAL) ×8
ELECTRODE REM PT RTRN 9FT ADLT (ELECTROSURGICAL) ×4 IMPLANT
FELT TEFLON 6X6 (MISCELLANEOUS) ×4 IMPLANT
GAUZE SPONGE 4X4 12PLY STRL (GAUZE/BANDAGES/DRESSINGS) ×4 IMPLANT
GLOVE EUDERMIC 7 POWDERFREE (GLOVE) IMPLANT
GLOVE SURG ENC MOIS LTX SZ7.5 (GLOVE) IMPLANT
GLOVE SURG ENC MOIS LTX SZ8 (GLOVE) IMPLANT
GLOVE SURG ORTHO LTX SZ7.5 (GLOVE) IMPLANT
GOWN STRL REUS W/ TWL LRG LVL3 (GOWN DISPOSABLE) IMPLANT
GOWN STRL REUS W/ TWL XL LVL3 (GOWN DISPOSABLE) ×2 IMPLANT
GOWN STRL REUS W/TWL LRG LVL3 (GOWN DISPOSABLE)
GOWN STRL REUS W/TWL XL LVL3 (GOWN DISPOSABLE) ×4
GUIDEWIRE SAF TJ AMPL .035X180 (WIRE) ×4 IMPLANT
GUIDEWIRE SAFE TJ AMPLATZ EXST (WIRE) ×8 IMPLANT
INSERT FOGARTY SM (MISCELLANEOUS) IMPLANT
KIT BASIN OR (CUSTOM PROCEDURE TRAY) ×4 IMPLANT
KIT HEART LEFT (KITS) ×4 IMPLANT
KIT SUCTION CATH 14FR (SUCTIONS) IMPLANT
KIT TURNOVER KIT B (KITS) ×4 IMPLANT
LOOP VESSEL MAXI BLUE (MISCELLANEOUS) IMPLANT
LOOP VESSEL MINI RED (MISCELLANEOUS) IMPLANT
NS IRRIG 1000ML POUR BTL (IV SOLUTION) ×4 IMPLANT
PACK ENDO MINOR (CUSTOM PROCEDURE TRAY) ×4 IMPLANT
PAD ARMBOARD 7.5X6 YLW CONV (MISCELLANEOUS) ×8 IMPLANT
PAD ELECT DEFIB RADIOL ZOLL (MISCELLANEOUS) ×4 IMPLANT
PENCIL BUTTON HOLSTER BLD 10FT (ELECTRODE) IMPLANT
PERCLOSE PROGLIDE 6F (VASCULAR PRODUCTS) ×8
POSITIONER HEAD DONUT 9IN (MISCELLANEOUS) ×4 IMPLANT
SET MICROPUNCTURE 5F STIFF (MISCELLANEOUS) ×4 IMPLANT
SHEATH BRITE TIP 7FR 35CM (SHEATH) ×4 IMPLANT
SHEATH PINNACLE 6F 10CM (SHEATH) ×4 IMPLANT
SHEATH PINNACLE 8F 10CM (SHEATH) ×4 IMPLANT
SLEEVE REPOSITIONING LENGTH 30 (MISCELLANEOUS) ×4 IMPLANT
SPONGE GAUZE 2X2 8PLY STER LF (GAUZE/BANDAGES/DRESSINGS) ×1
SPONGE GAUZE 2X2 8PLY STRL LF (GAUZE/BANDAGES/DRESSINGS) ×2 IMPLANT
STOPCOCK MORSE 400PSI 3WAY (MISCELLANEOUS) ×8 IMPLANT
SUT ETHIBOND X763 2 0 SH 1 (SUTURE) IMPLANT
SUT GORETEX CV 4 TH 22 36 (SUTURE) IMPLANT
SUT GORETEX CV4 TH-18 (SUTURE) IMPLANT
SUT MNCRL AB 3-0 PS2 18 (SUTURE) IMPLANT
SUT PROLENE 5 0 C 1 36 (SUTURE) IMPLANT
SUT PROLENE 6 0 C 1 30 (SUTURE) IMPLANT
SUT SILK  1 MH (SUTURE) ×4
SUT SILK 1 MH (SUTURE) ×2 IMPLANT
SUT VIC AB 2-0 CT1 27 (SUTURE)
SUT VIC AB 2-0 CT1 TAPERPNT 27 (SUTURE) IMPLANT
SUT VIC AB 2-0 CTX 36 (SUTURE) IMPLANT
SUT VIC AB 3-0 SH 8-18 (SUTURE) IMPLANT
SYR 50ML LL SCALE MARK (SYRINGE) ×4 IMPLANT
SYR BULB IRRIG 60ML STRL (SYRINGE) IMPLANT
SYR MEDRAD MARK V 150ML (SYRINGE) ×4 IMPLANT
TOWEL GREEN STERILE (TOWEL DISPOSABLE) ×8 IMPLANT
TRANSDUCER W/STOPCOCK (MISCELLANEOUS) ×8 IMPLANT
TRAY FOLEY SLVR 14FR TEMP STAT (SET/KITS/TRAYS/PACK) IMPLANT
TUBE SUCT INTRACARD DLP 20F (MISCELLANEOUS) IMPLANT
VALVE 23 ULTRA SAPIEN KIT (Valve) ×2 IMPLANT
WIRE EMERALD 3MM-J .035X150CM (WIRE) ×4 IMPLANT
WIRE EMERALD 3MM-J .035X260CM (WIRE) ×4 IMPLANT
WIRE EMERALD ST .035X260CM (WIRE) ×4 IMPLANT

## 2020-11-17 NOTE — Anesthesia Procedure Notes (Signed)
Procedure Name: MAC Date/Time: 11/17/2020 7:40 AM Performed by: Barrington Ellison, CRNA Pre-anesthesia Checklist: Patient identified, Emergency Drugs available, Suction available, Patient being monitored and Timeout performed Patient Re-evaluated:Patient Re-evaluated prior to induction Oxygen Delivery Method: Simple face mask

## 2020-11-17 NOTE — Progress Notes (Signed)
  [facility] TE[facility]  Patient doing well s/p TAVR. She is hemodynamically stable. BP was a little soft but improved with 250 cc NS bolus. Pt still groggy. Groin sites stable. ECG with new LBBB but no high grade block. Arterial line discontinued and transferred to 4E. Plan for early ambulation after bedrest completed and hopeful discharge over the next 24-48 hours.   Angelena Form PA-C  [facility]  Pager [phone]

## 2020-11-17 NOTE — Discharge Instructions (Signed)
ACTIVITY AND EXERCISE °• Daily activity and exercise are an important part of your recovery. People recover at different rates depending on their general health and type of valve procedure. °• Most people recovering from TAVR feel better relatively quickly  °• No lifting, pushing, pulling more than 10 pounds (examples to avoid: groceries, vacuuming, gardening, golfing): °            - For one week with a procedure through the groin. °            - For six weeks for procedures through the chest wall or neck. °NOTE: You will typically see one of our providers 7-14 days after your procedure to discuss WHEN TO RESUME the above activities.  °  °  °DRIVING °• Do not drive until you are seen for follow up and cleared by a provider. Generally, we ask patient to not drive for 1 week after their procedure. °• If you have been told by your doctor in the past that you may not drive, you must talk with him/her before you begin driving again. °  °DRESSING °• Groin site: you may leave the clear dressing over the site for up to one week or until it falls off. °  °HYGIENE °• If you had a femoral (leg) procedure, you may take a shower when you return home. After the shower, pat the site dry. Do NOT use powder, oils or lotions in your groin area until the site has completely healed. °• If you had a chest procedure, you may shower when you return home unless specifically instructed not to by your discharging practitioner. °            - DO NOT scrub incision; pat dry with a towel. °            - DO NOT apply any lotions, oils, powders to the incision. °            - No tub baths / swimming for at least 2 weeks. °• If you notice any fevers, chills, increased pain, swelling, bleeding or pus, please contact your doctor. °  °ADDITIONAL INFORMATION °• If you are going to have an upcoming dental procedure, please contact our office as you will require antibiotics ahead of time to prevent infection on your heart valve.  ° ° °If you have any  questions or concerns you can call the structural heart phone during normal business hours 8am-4pm. If you have an urgent need after hours or weekends please call [phone] to talk to the on call provider for general cardiology. If you have an emergency that requires immediate attention, please call 911.  ° ° °After TAVR Checklist ° °Check  Test Description  ° Follow up appointment in 1-2 weeks  You will see our structural heart physician assistant, Katie Peder Allums. Your incision sites will be checked and you will be cleared to drive and resume all normal activities if you are doing well.    ° 1 month echo and follow up  You will have an echo to check on your new heart valve and be seen back in the office by Katie Christien Frankl. Many times the echo is not read by your appointment time, but Katie will call you later that day or the following day to report your results.  ° Follow up with your primary cardiologist You will need to be seen by your primary cardiologist in the following 3-6 months after your 1 month appointment in the valve   clinic. Often times your Plavix or Aspirin will be discontinued during this time, but this is decided on a case by case basis.   ° 1 year echo and follow up You will have another echo to check on your heart valve after 1 year and be seen back in the office by Katie Demaria Deeney. This your last structural heart visit.  ° Bacterial endocarditis prophylaxis  You will have to take antibiotics for the rest of your life before all dental procedures (even teeth cleanings) to protect your heart valve. Antibiotics are also required before some surgeries. Please check with your cardiologist before scheduling any surgeries. Also, please make sure to tell us if you have a penicillin allergy as you will require an alternative antibiotic.   ° ° °

## 2020-11-17 NOTE — Interval H&P Note (Signed)
History and Physical Interval Note:  11/17/2020 6:16 AM  Sierra Carpenter  has presented today for surgery, with the diagnosis of Severe Aortic Stenosis.  The various methods of treatment have been discussed with the patient and family. After consideration of risks, benefits and other options for treatment, the patient has consented to  Procedure(s): TRANSCATHETER AORTIC VALVE REPLACEMENT, TRANSFEMORAL (N/A) TRANSESOPHAGEAL ECHOCARDIOGRAM (TEE) (N/A) as a surgical intervention.  The patient's history has been reviewed, patient examined, no change in status, stable for surgery.  I have reviewed the patient's chart and labs.  Questions were answered to the patient's satisfaction.     Gaye Pollack

## 2020-11-17 NOTE — Op Note (Addendum)
[facility]   MULTIDISCIPLINARY HEART VALVE TEAM   TAVR OPERATIVE NOTE Date of Procedure:  11/17/2020  Preoperative Diagnosis: Severe Aortic Stenosis   Postoperative Diagnosis: Same   Procedure:   Transcatheter Aortic Valve Replacement - Percutaneous  Transfemoral Approach  Edwards Sapien 3 Ultra THV (size 23 mm, model # 9750TFX, serial # [id number])   Co-Surgeons:  Gaye Pollack, MD and Sherren Mocha, MD  Anesthesiologist:  Hoy Morn, MD  Echocardiographer:  Rudean Haskell, MD  Pre-operative Echo Findings: Severe aortic stenosis Normal left ventricular systolic function  Post-operative Echo Findings: No paravalvular leak Normal left ventricular systolic function  BRIEF CLINICAL NOTE AND INDICATIONS FOR SURGERY  75 yo woman with severe stage D1 aortic stenosis, NYHA functional class 2 symptoms of chronic dyspnea/fatigue c/w chronic diastolic heart failure, presents today for TAVR with an Edwards Sapien 3 THV via planned left TF approach.   During the course of the patient's preoperative work up they have been evaluated comprehensively by a multidisciplinary team of specialists coordinated through the [facility] in the [facility].  They have been demonstrated to suffer from symptomatic severe aortic stenosis as noted above. The patient has been counseled extensively as to the relative risks and benefits of all options for the treatment of severe aortic stenosis including long term medical therapy, conventional surgery for aortic valve replacement, and transcatheter aortic valve replacement.  The patient has been independently evaluated in formal cardiac surgical consultation by Dr Cyndia Bent, who deemed the patient appropriate for TAVR. Based upon review of all of the patient's preoperative diagnostic tests they are felt to be candidate for transcatheter aortic valve replacement using the transfemoral approach  as an alternative to conventional surgery.    Following the decision to proceed with transcatheter aortic valve replacement, a discussion has been held regarding what types of management strategies would be attempted intraoperatively in the event of life-threatening complications, including whether or not the patient would be considered a candidate for the use of cardiopulmonary bypass and/or conversion to open sternotomy for attempted surgical intervention.  The patient has been advised of a variety of complications that might develop peculiar to this approach including but not limited to risks of death, stroke, paravalvular leak, aortic dissection or other major vascular complications, aortic annulus rupture, device embolization, cardiac rupture or perforation, acute myocardial infarction, arrhythmia, heart block or bradycardia requiring permanent pacemaker placement, congestive heart failure, respiratory failure, renal failure, pneumonia, infection, other late complications related to structural valve deterioration or migration, or other complications that might ultimately cause a temporary or permanent loss of functional independence or other long term morbidity.  The patient provides full informed consent for the procedure as described and all questions were answered preoperatively.  DETAILS OF THE OPERATIVE PROCEDURE  PREPARATION:   The patient is brought to the operating room on the above mentioned date and central monitoring was established by the anesthesia team including placement of a radial arterial line. The patient is placed in the supine position on the operating table.  Intravenous antibiotics are administered. The patient is monitored closely throughout the procedure under conscious sedation.    Baseline transthoracic echocardiogram is performed. The patient's chest, abdomen, both groins, and both lower extremities are prepared and draped in a sterile manner. A time out procedure is  performed.   PERIPHERAL ACCESS:   Using ultrasound guidance, femoral arterial and venous access is obtained with placement of 6 Fr sheaths on the right side.  A pigtail diagnostic catheter was passed through the femoral arterial sheath under fluoroscopic guidance into the aortic root.  A temporary transvenous pacemaker catheter was passed through the femoral venous sheath under fluoroscopic guidance into the right ventricle.  The pacemaker was tested to ensure stable lead placement and pacemaker capture. Aortic root angiography was performed in order to determine the optimal angiographic angle for valve deployment.  TRANSFEMORAL ACCESS:  A micropuncture technique is used to access the left femoral artery under fluoroscopic and ultrasound guidance.  2 Perclose devices are deployed at 10' and 2' positions to 'PreClose' the femoral artery. An 8 French sheath is placed and then an Amplatz Superstiff wire is advanced through the sheath. This is changed out for a 14 French transfemoral E-Sheath after progressively dilating over the Superstiff wire.  An AL-1 catheter was used to direct a straight-tip exchange length wire across the native aortic valve into the left ventricle. This was exchanged out for a pigtail catheter and position was confirmed in the LV apex. Simultaneous LV and Ao pressures were recorded.  The pigtail catheter was exchanged for an Amplatz Extra-stiff wire in the LV apex.    BALLOON AORTIC VALVULOPLASTY:  Not performed  TRANSCATHETER HEART VALVE DEPLOYMENT:  An Edwards Sapien 3 transcatheter heart valve (size 23 mm) was prepared and crimped per manufacturer's guidelines, and the proper orientation of the valve is confirmed on the [employer] delivery system. The valve was advanced through the introducer sheath using normal technique until in an appropriate position in the abdominal aorta beyond the sheath tip. The balloon was then retracted and using the fine-tuning wheel was  centered on the valve. The valve was then advanced across the aortic arch using appropriate flexion of the catheter. The valve was carefully positioned across the aortic valve annulus. The Commander catheter was retracted using normal technique. Once final position of the valve has been confirmed by angiographic assessment, the valve is deployed while temporarily holding ventilation and during rapid ventricular pacing to maintain systolic blood pressure < 50 mmHg and pulse pressure < 10 mmHg. The balloon inflation is held for >3 seconds after reaching full deployment volume. Once the balloon has fully deflated the balloon is retracted into the ascending aorta and valve function is assessed using echocardiography. The patient's hemodynamic recovery following valve deployment is good.  The deployment balloon and guidewire are both removed. Echo demostrated acceptable post-procedural gradients, stable mitral valve function, and no aortic insufficiency.    PROCEDURE COMPLETION:  The sheath was removed and femoral artery closure is performed using the 2 previously deployed Perclose devices.  Protamine is administered once femoral arterial repair was complete. The site is clear with no evidence of bleeding or hematoma after the sutures are tightened. The temporary pacemaker and pigtail catheters are removed. Mynx closure is used for contralateral femoral arterial hemostasis for the 6 Fr sheath.  The patient tolerated the procedure well and is transported to the surgical intensive care in stable condition. There were no immediate intraoperative complications. All sponge instrument and needle counts are verified correct at completion of the operation.   The patient received a total of 40 mL of intravenous contrast during the procedure.   Sherren Mocha, MD 11/17/2020 9:39 AM

## 2020-11-17 NOTE — Progress Notes (Signed)
Patient arrived from the cath lab.  S/P TVAR.   A&Ox3. CHG bath given. Vital signs per protocol.  Bed rest/flat until 1530, patient aware.  Bilateral groins intact-level 0 CCMD notified.  Telebox [id number].   Daughter notified she is on unit.

## 2020-11-17 NOTE — Anesthesia Postprocedure Evaluation (Signed)
Anesthesia Post Note  Patient: Iqra J Anderegg  Procedure(s) Performed: TRANSCATHETER AORTIC VALVE REPLACEMENT, TRANSFEMORAL (Chest) TRANSESOPHAGEAL ECHOCARDIOGRAM (TEE)     Patient location during evaluation: Cath Lab Anesthesia Type: MAC Level of consciousness: awake and alert Pain management: pain level controlled Vital Signs Assessment: post-procedure vital signs reviewed and stable Respiratory status: spontaneous breathing, nonlabored ventilation, respiratory function stable and patient connected to nasal cannula oxygen Cardiovascular status: stable, blood pressure returned to baseline and bradycardic Postop Assessment: no apparent nausea or vomiting Anesthetic complications: no   No notable events documented.  Last Vitals:  Vitals:   11/17/20 1300 11/17/20 1612  BP: (!) 122/58 (!) 145/64  Pulse: (!) 46 (!) 55  Resp:  18  Temp:  36.4 C  SpO2:  96%    Last Pain:  Vitals:   11/17/20 1612  TempSrc: Oral  PainSc:                  Catalina Gravel

## 2020-11-17 NOTE — Transfer of Care (Signed)
Immediate Anesthesia Transfer of Care Note  Patient: Sierra Carpenter  Procedure(s) Performed: TRANSCATHETER AORTIC VALVE REPLACEMENT, TRANSFEMORAL (Chest) TRANSESOPHAGEAL ECHOCARDIOGRAM (TEE)  Patient Location: Cath Lab  Anesthesia Type:MAC  Level of Consciousness: drowsy and responds to stimulation  Airway & Oxygen Therapy: Patient Spontanous Breathing and Patient connected to nasal cannula oxygen  Post-op Assessment: Report given to RN  Post vital signs: Reviewed and stable  Last Vitals:  Vitals Value Taken Time  BP 103/49 11/17/20 0946  Temp    Pulse 55 11/17/20 0948  Resp 14 11/17/20 0948  SpO2 96 % 11/17/20 0948  Vitals shown include unvalidated device data.  Last Pain:  Vitals:   11/17/20 0605  TempSrc:   PainSc: 0-No pain         Complications: No notable events documented.

## 2020-11-17 NOTE — Progress Notes (Signed)
Katie, PA to bedside, aware of pts decreased BP, see mar, safety maintained

## 2020-11-17 NOTE — Anesthesia Procedure Notes (Signed)
Arterial Line Insertion Start/End8/16/2022 6:55 AM, 11/17/2020 7:10 AM Performed by: Josephine Igo, CRNA, CRNA  Patient location: Pre-op. Preanesthetic checklist: patient identified, IV checked, risks and benefits discussed and surgical consent Lidocaine 1% used for infiltration Right, radial was placed Catheter size: 20 G Hand hygiene performed  and maximum sterile barriers used   Attempts: 1 Procedure performed without using ultrasound guided technique. Following insertion, dressing applied and Biopatch. Post procedure assessment: normal  Patient tolerated the procedure well with no immediate complications.

## 2020-11-17 NOTE — Op Note (Signed)
[facility]   MULTIDISCIPLINARY HEART VALVE TEAM   TAVR OPERATIVE NOTE   Date of Procedure:  11/17/2020  Preoperative Diagnosis: Severe Aortic Stenosis   Postoperative Diagnosis: Same   Procedure:   Transcatheter Aortic Valve Replacement - Percutaneous Left Transfemoral Approach  Edwards Sapien 3 Ultra THV (size 23 mm, model # 9750TFX, serial # [id number])   Co-Surgeons:  Gaye Pollack, MD and Sherren Mocha, MD     Anesthesiologist:  S. Gifford Shave, MD  Echocardiographer:  Osborne Oman, MD  Pre-operative Echo Findings: Severe aortic stenosis Normal left ventricular systolic function  Post-operative Echo Findings: No paravalvular leak Normal left ventricular systolic function   BRIEF CLINICAL NOTE AND INDICATIONS FOR SURGERY  This 75 year old woman has stage D, severe, symptomatic aortic stenosis with New York Heart Association class II symptoms of exertional fatigue and shortness of breath consistent with chronic diastolic congestive heart failure.  She has also developed episodes of dizziness and chest discomfort with exertion.  She is still very active out working in her yard.  I have personally reviewed her 2D echocardiogram, cardiac catheterization, and CTA studies.  Her echo shows a trileaflet aortic valve with severe calcification and thickening of the leaflets with restricted mobility.  The mean gradient across her aortic valve was 42.5 mmHg with a valve area of 0.67 cm consistent with severe aortic stenosis.  Left ventricular systolic function is normal.  Cardiac catheterization showed no significant coronary stenoses.  The mean gradient across aortic valve was measured at 78 mmHg with a peak to peak gradient of 86 mmHg and a valve area of 0.37 cm consistent with critical aortic stenosis.  I agree that aortic valve replacement is indicated in this patient for relief of her symptoms and to prevent progressive left ventricular deterioration and sudden death.   Given her age I think transcatheter aortic valve replacement would be the best option for her.  Her gated cardiac CTA shows anatomy suitable for TAVR using a 23 mm Edwards SAPIEN 3 valve.  Her abdominal and pelvic CTA shows adequate pelvic vascular anatomy to allow transfemoral insertion.   The patient and her daughter were counseled at length regarding treatment alternatives for management of severe symptomatic aortic stenosis. The risks and benefits of surgical intervention has been discussed in detail. Long-term prognosis with medical therapy was discussed. Alternative approaches such as conventional surgical aortic valve replacement, transcatheter aortic valve replacement, and palliative medical therapy were compared and contrasted at length. This discussion was placed in the context of the patient's own specific clinical presentation and past medical history. All of their questions have been addressed.    Following the decision to proceed with transcatheter aortic valve replacement, a discussion was held regarding what types of management strategies would be attempted intraoperatively in the event of life-threatening complications, including whether or not the patient would be considered a candidate for the use of cardiopulmonary bypass and/or conversion to open sternotomy for attempted surgical intervention.  I think she would certainly be a candidate for emergent sternotomy to manage any intraoperative complications.  The patient is aware of the fact that transient use of cardiopulmonary bypass may be necessary. The patient has been advised of a variety of complications that might develop including but not limited to risks of death, stroke, paravalvular leak, aortic dissection or other major vascular complications, aortic annulus rupture, device embolization, cardiac rupture or perforation, mitral regurgitation, acute myocardial infarction, arrhythmia, heart block or bradycardia requiring permanent  pacemaker placement, congestive heart failure, respiratory  failure, renal failure, pneumonia, infection, other late complications related to structural valve deterioration or migration, or other complications that might ultimately cause a temporary or permanent loss of functional independence or other long term morbidity. The patient provides full informed consent for the procedure as described and all questions were answered.       DETAILS OF THE OPERATIVE PROCEDURE  PREPARATION:    The patient was brought to the operating room on the above mentioned date and appropriate monitoring was established by the anesthesia team. The patient was placed in the supine position on the operating table.  Intravenous antibiotics were administered. The patient was monitored closely throughout the procedure under conscious sedation.    Baseline transthoracic echocardiogram was performed. The patient's abdomen and both groins were prepped and draped in a sterile manner. A time out procedure was performed.   PERIPHERAL ACCESS:    Using the modified Seldinger technique, femoral arterial and venous access was obtained with placement of 6 Fr sheaths on the right side.  A pigtail diagnostic catheter was passed through the right arterial sheath under fluoroscopic guidance into the aortic root.  A temporary transvenous pacemaker catheter was passed through the right femoral venous sheath under fluoroscopic guidance into the right ventricle.  The pacemaker was tested to ensure stable lead placement and pacemaker capture. Aortic root angiography was performed in order to determine the optimal angiographic angle for valve deployment.   TRANSFEMORAL ACCESS:   Percutaneous transfemoral access and sheath placement was performed using ultrasound guidance.  The left common femoral artery was cannulated using a micropuncture needle and appropriate location was verified using hand injection angiogram.  A pair of [employer] Perclose  percutaneous closure devices were placed and a 6 French sheath replaced into the femoral artery.  The patient was heparinized systemically and ACT verified > 250 seconds.    A 14 Fr transfemoral E-sheath was introduced into the left common femoral artery after progressively dilating over an Amplatz superstiff wire. An AL-1 catheter was used to direct a straight-tip exchange length wire across the native aortic valve into the left ventricle. This was exchanged out for a pigtail catheter and position was confirmed in the LV apex. Simultaneous LV and Ao pressures were recorded.  The pigtail catheter was exchanged for an Amplatz Extra-stiff wire in the LV apex.    BALLOON AORTIC VALVULOPLASTY:   Not performed   TRANSCATHETER HEART VALVE DEPLOYMENT:   An Edwards Sapien 3 Ultra transcatheter heart valve (size 23 mm) was prepared and crimped per manufacturer's guidelines, and the proper orientation of the valve is confirmed on the [employer] delivery system. The valve was advanced through the introducer sheath using normal technique until in an appropriate position in the abdominal aorta beyond the sheath tip. The balloon was then retracted and using the fine-tuning wheel was centered on the valve. The valve was then advanced across the aortic arch using appropriate flexion of the catheter. The valve was carefully positioned across the aortic valve annulus. The Commander catheter was retracted using normal technique. Once final position of the valve has been confirmed by angiographic assessment, the valve is deployed while temporarily holding ventilation and during rapid ventricular pacing to maintain systolic blood pressure < 50 mmHg and pulse pressure < 10 mmHg. The balloon inflation is held for >3 seconds after reaching full deployment volume. Once the balloon has fully deflated the balloon is retracted into the ascending aorta and valve function is assessed using echocardiography. There is felt to be  no paravalvular leak and no central aortic insufficiency.  The patient's hemodynamic recovery following valve deployment is good.  The deployment balloon and guidewire are both removed.    PROCEDURE COMPLETION:   The sheath was removed and femoral artery closure performed.  Protamine was administered once femoral arterial repair was complete. The temporary pacemaker, pigtail catheters and femoral sheaths were removed with manual pressure used for hemostasis.  A Mynx femoral closure device was utilized following removal of the diagnostic sheath in the right femoral artery.  The patient tolerated the procedure well and is transported to the cath lab recovery area in stable condition. There were no immediate intraoperative complications. All sponge instrument and needle counts are verified correct at completion of the operation.   No blood products were administered during the operation.  The patient received a total of 40 mL of intravenous contrast during the procedure.   Gaye Pollack, MD 11/17/2020

## 2020-11-17 NOTE — Consult Note (Signed)
[facility]   MULTIDISCIPLINARY HEART VALVE TEAM  Date:  11/17/2020   ID:  Sierra Carpenter 1946-01-21, MRN [id number]  PCP:  Ronnell Freshwater, NP   Shortness of Breath   HISTORY OF PRESENT ILLNESS: Sierra Carpenter is a 75 y.o. female who presents today for TAVR after undergoing multidisciplinary heart team evaluation.   The patient is 75 years old with a history of hypertension and nonobstructive coronary artery disease.  She has developed critical aortic stenosis and has undergone extensive multidisciplinary team evaluation with echo, cardiac catheterization, and CTA assessment.  She underwent formal surgical consultation November 04, 2020 by Dr. Cyndia Bent.  The patient has developed progressive symptoms of exertional dyspnea and generalized weakness when doing moderate level activity.  There has been occasional chest discomfort with exertion as well.  No orthopnea or PND.  No peripheral edema.  No frank syncope.  Preprocedural assessment has demonstrated vigorous LV systolic function with LVEF 70 to 75%.  There is no obstructive coronary artery disease.  The patient has adequate transfemoral access for delivery of a 23 mm Edwards SAPIEN 3 ultra valve.  Past Medical History:  Diagnosis Date   Aortic valve sclerosis    Coronary atherosclerosis    without significant obstruction   Heart murmur    History of kidney stones    History of nuclear stress test 07/12/2010   bruce protocol myoview; normal pattern of perfusion, low risk, post-stress EF 92%   Hyperlipidemia    Hypertension     Current Facility-Administered Medications  Medication Dose Route Frequency Provider Last Rate Last Admin   [START ON 11/18/2020] 0.9 %  sodium chloride infusion   Intravenous Continuous Sherren Mocha, MD       ceFAZolin (ANCEF) IVPB 2g/100 mL premix  2 g Intravenous To OR Sherren Mocha, MD       chlorhexidine (HIBICLENS) 4 % liquid 2 application  30 mL Topical UD Sherren Mocha, MD        chlorhexidine (HIBICLENS) 4 % liquid 4 application  60 mL Topical Once Sherren Mocha, MD       And   [START ON 11/18/2020] chlorhexidine (HIBICLENS) 4 % liquid 4 application  60 mL Topical Once Sherren Mocha, MD       dexmedetomidine (PRECEDEX) 400 MCG/100ML (4 mcg/mL) infusion  0.1-0.7 mcg/kg/hr Intravenous To OR Sherren Mocha, MD       heparin 30,000 units/NS 1000 mL solution for CELLSAVER   Other To OR Sherren Mocha, MD       magnesium sulfate (IV Push/IM) injection 40 mEq  40 mEq Other To OR Sherren Mocha, MD       norepinephrine (LEVOPHED) '4mg'$  in 226m premix infusion  0-10 mcg/min Intravenous To OR CSherren Mocha MD       potassium chloride injection 80 mEq  80 mEq Other To OR CSherren Mocha MD        ALLERGIES:   Hydrochlorothiazide   SOCIAL HISTORY:  The patient  reports that she has never smoked. She has never used smokeless tobacco. She reports that she does not drink alcohol and does not use drugs.   FAMILY HISTORY:  The patient's family history includes Alzheimer's disease in her mother; COPD in her brother; Heart disease in her brother, brother, brother, paternal grandfather, and son; Heart disease (age of onset: 254 in her father.   REVIEW OF SYSTEMS:  Positive for fatigue.   All other systems are reviewed and negative.   PHYSICAL EXAM: VS:  BP (!) 176/65   Pulse 71   Temp 97.7 F (36.5 C) (Oral)   Resp 18   Ht '5\' 1"'$  (1.549 m)   Wt 74.4 kg   SpO2 96%   BMI 30.99 kg/m  , BMI Body mass index is 30.99 kg/m. GEN: Well nourished, well developed, in no acute distress HEENT: normal Neck: No JVD. carotids 2+ with BL bruits Cardiac: The heart is RRR with 3/6 harsh systolic murmur at the RUSB. No edema. Pedal pulses 2+ = bilaterally  Respiratory:  clear to auscultation bilaterally GI: soft, nontender, nondistended, + BS MS: no deformity or atrophy Skin: warm and dry, no rash Neuro:  Strength and sensation are intact Psych: euthymic mood, full affect  EKG:   EKG from 11/13/2020 reviewed and demonstrates normal sinus rhythm with LVH, possible age-indeterminate anterior infarct  RECENT LABS: 09/23/2020: TSH 2.670 11/13/2020: ALT 19; BUN 22; Creatinine, Ser 1.05; Hemoglobin 16.6; Platelets 277; Potassium 4.2; Sodium 139  09/23/2020: Chol/HDL Ratio 3.3; Cholesterol, Total 137; HDL 41; LDL Chol Calc (NIH) 76; Triglycerides 112   Estimated Creatinine Clearance: 44.6 mL/min (A) (by C-G formula based on SCr of 1.05 mg/dL (H)).   Wt Readings from Last 3 Encounters:  11/17/20 74.4 kg  11/13/20 75.5 kg  11/04/20 74.4 kg     CARDIAC STUDIES: Echo:   1. Left ventricular ejection fraction, by estimation, is 70 to 75%. The  left ventricle has hyperdynamic function. The left ventricle has no  regional wall motion abnormalities. There is mild concentric left  ventricular hypertrophy. Left ventricular  diastolic parameters are consistent with Grade I diastolic dysfunction  (impaired relaxation). Elevated left atrial pressure.   2. Right ventricular systolic function is normal. The right ventricular  size is normal.   3. The mitral valve is normal in structure. Trivial mitral valve  regurgitation. No evidence of mitral stenosis.   4. The aortic valve is tricuspid. There is severe calcification of the  aortic valve. There is severe thickening of the aortic valve. Aortic valve  regurgitation is mild. Severe aortic valve stenosis. Aortic valve area, by  VTI measures 0.67 cm. Aortic  valve mean gradient measures 42.5 mmHg. Aortic valve Vmax measures 4.57  m/s. Aortic valve acceleration time measures 130 msec.   5. The inferior vena cava is normal in size with greater than 50%  respiratory variability, suggesting right atrial pressure of 3 mmHg.   Comparison(s): No significant change from prior study. Prior images  reviewed side by side. Aortic stenosis remains severe.   Cardiac Cath:  Conclusion      There is severe aortic valve stenosis.   1.  Patent  coronary arteries with mild calcification and luminal irregularity, no significant stenoses 2.  Normal right heart pressures 3.  Critical aortic stenosis with mean gradient 78 mmHg, peak to peak gradient 86 mmHg, calculated aortic valve area 0.37 cm.   Recommendations: expedited workup for treatment of severe aortic stenosis  Coronary CTA: FINDINGS: Aortic Valve: Severely thickened aortic valve with heavy calcification and reduced excursion the planimeter valve area is 0.786 Sq cm consistent with severe aortic stenosis   Number of leaflets: 3   LVOT calcification: None   Annular calcification: Mild   Prosthetic Valve: NA   Left ventricle: LVEF 60% without evidence of wall motion abnormality   Aortic Annulus Measurements   Major annulus diameter: 25 mm   Minor annulus diameter:20 mm   Annular perimeter: 69 mm   Annular area: 3.7 sq cm   Aortic  Root Measurements   Sinus of Valsalva perimeter: 92 mm   Sinotubular Junction: 28 mm   Ascending Thoracic Aorta: 38 mm   Aortic Arch: 29 mm   Descending Thoracic Aorta: 20 mm   Sinus of Valsalva Measurements:   Right coronary cusp width: 24 mm   Left coronary cusp width: 29 mm   Non coronary cusp width: 29 mm   Coronary Artery Height above Annulus:   Left Main: 13 mm   Right Coronary: 12 mm   Optimum Fluoroscopic Angle for Delivery: LAO 21 CAU 0   Valves for structural team consideration: 26 mm CoreValve, 23 mm Edwards Sapien   IMPRESSION: 1. Severe Aortic stenosis. Findings pertinent to TAVR procedure are detailed above.   CTA Chest/Abd/Pelvis: VASCULAR MEASUREMENTS PERTINENT TO TAVR:   AORTA:   Minimal Aortic Diameter-10.8 x 10.3 mm   Severity of Aortic Calcification-mild-to-moderate   RIGHT PELVIS:   Right Common Iliac Artery -   Minimal Diameter-6.8 x 5.5 mm   Tortuosity-mild   Calcification-mild-to-moderate   Right External Iliac Artery -   Minimal Diameter-5.6 x 5.4 mm    Tortuosity-mild-to-moderate   Calcification-none   Right Common Femoral Artery -   Minimal Diameter-6.1 x 6.0 mm   Tortuosity-mild   Calcification-none   LEFT PELVIS:   Left Common Iliac Artery -   Minimal Diameter-7.5 x 6.8 mm   Tortuosity-mild-to-moderate   Calcification-mild   Left External Iliac Artery -   Minimal Diameter-5.9 x 5.5 mm   Tortuosity-mild to moderate   Calcification-none   Left Common Femoral Artery -   Minimal Diameter-6.5 x 6.5 mm   Tortuosity-mild   Calcification-none   Review of the MIP images confirms the above findings.   IMPRESSION: 1. Vascular findings and measurements pertinent to potential TAVR procedure, as detailed. 2. Diffuse thickening and coarse calcification of the aortic valve, compatible with reported aortic stenosis. 3. Bilateral thyroid nodules, largest 2.3 cm on the left. Recommend thyroid US (ref: J Am Coll Radiol. 2015 Feb;12(2): 143-50). 4. Mild cardiomegaly. 5. Marked diffuse colonic diverticulosis. 6. Small hiatal hernia. 7. Sclerosis throughout the L2 vertebral body with associated trabecular thickening, suggesting Paget's disease. 8. Aortic Atherosclerosis (ICD10-I70.0).  STS RISK CALCULATOR: Procedure: Isolated AVR  Risk of Mortality:  1.572%  Renal Failure:  0.994%  Permanent Stroke:  1.304%  Prolonged Ventilation:  3.875%  DSW Infection:  0.078%  Reoperation:  2.860%  Morbidity or Mortality:  7.051%  Short Length of Stay:  47.653%  Long Length of Stay:  2.864%   ASSESSMENT AND PLAN: 2.  75 year old woman with severe, stage D, symptomatic aortic stenosis associated with NYHA functional class II symptoms of exertional dyspnea and fatigue consistent with chronic diastolic heart failure.  Preoperative studies has demonstrated vascular anatomy suitable for transfemoral insertion of an [place] 3 transcatheter heart valve to treat an annular area of 370 mm.  Risks, indications, and  alternatives to TAVR have been discussed extensively with the patient and her family.  Please see Dr. Vivi Martens complete consultation note from November 04, 2020 for details.  All of the patient's questions were answered.  She reports no interval change in symptoms today.  We will proceed with TAVR as planned.    Deatra James 11/17/2020 7:00 AM     [address] [place] [id number]  [phone] (office) [phone] (fax)

## 2020-11-17 NOTE — Progress Notes (Signed)
LOCATION: right RADIAL a-line  a-line removed and manual pressure held for approximately 10 minutes   SITE UPON ARRIVAL: LEVEL 0  SITE AFTER BAND REMOVAL: LEVEL 0  CIRCULATION SENSATION AND  MOVEMENT: yes, +2 radial pulse  COMMENTS: gauze with tegaderm applied, post dc instructions given

## 2020-11-17 NOTE — Progress Notes (Signed)
Mobility Specialist: Progress Note   11/17/20 1741  Mobility  Activity Ambulated in hall  Level of Assistance Independent  Assistive Device None  Distance Ambulated (ft) 190 ft  Mobility Ambulated independently in hallway  Mobility Response Tolerated well  Mobility performed by Mobility specialist  Bed Position Chair  $Mobility charge 1 Mobility   Pre-Mobility: 58 HR, 163/67 BP, 98% SpO2 Post-Mobility: 71 HR, 98% SpO2  Pt min guard to stand. Pt requested to use BR, void successful. Pt agreeable to ambulation, asx throughout. Pt to chair after walk. No bleeding noted at groin site. RN present in the room.   [facility] Ashelyn Mccravy Mobility Specialist Mobility Specialist Phone: [phone]

## 2020-11-18 ENCOUNTER — Encounter (HOSPITAL_COMMUNITY): Payer: Self-pay | Admitting: Cardiovascular Disease

## 2020-11-18 ENCOUNTER — Other Ambulatory Visit: Payer: Self-pay | Admitting: Physician Assistant

## 2020-11-18 ENCOUNTER — Inpatient Hospital Stay (HOSPITAL_COMMUNITY): Payer: Medicare Other

## 2020-11-18 DIAGNOSIS — Z952 Presence of prosthetic heart valve: Secondary | ICD-10-CM

## 2020-11-18 DIAGNOSIS — I350 Nonrheumatic aortic (valve) stenosis: Secondary | ICD-10-CM

## 2020-11-18 LAB — CBC
HCT: 42.3 % (ref 36.0–46.0)
Hemoglobin: 14.8 g/dL (ref 12.0–15.0)
MCH: 31.6 pg (ref 26.0–34.0)
MCHC: 35 g/dL (ref 30.0–36.0)
MCV: 90.4 fL (ref 80.0–100.0)
Platelets: 218 10*3/uL (ref 150–400)
RBC: 4.68 MIL/uL (ref 3.87–5.11)
RDW: 13.2 % (ref 11.5–15.5)
WBC: 12.1 10*3/uL — ABNORMAL HIGH (ref 4.0–10.5)
nRBC: 0 % (ref 0.0–0.2)

## 2020-11-18 LAB — BASIC METABOLIC PANEL
Anion gap: 8 (ref 5–15)
BUN: 16 mg/dL (ref 8–23)
CO2: 24 mmol/L (ref 22–32)
Calcium: 9 mg/dL (ref 8.9–10.3)
Chloride: 104 mmol/L (ref 98–111)
Creatinine, Ser: 0.86 mg/dL (ref 0.44–1.00)
GFR, Estimated: >60 mL/min (ref >60)
Glucose, Bld: 110 mg/dL — ABNORMAL HIGH (ref 70–99)
Potassium: 3.9 mmol/L (ref 3.5–5.1)
Sodium: 136 mmol/L (ref 135–145)

## 2020-11-18 LAB — ECHOCARDIOGRAM LIMITED
AR max vel: 0.9 cm2
AV Area VTI: 0.94 cm2
AV Area mean vel: 0.92 cm2
AV Mean grad: 18.5 mmHg
AV Peak grad: 33.2 mmHg
Ao pk vel: 2.88 m/s
Height: 65 in
Weight: 2694.4 oz

## 2020-11-18 MED ORDER — IRBESARTAN 150 MG PO TABS
75.0000 mg | ORAL_TABLET | Freq: Every day | ORAL | Status: DC
  Administered 2020-11-18: 75 mg via ORAL
  Filled 2020-11-18: qty 1

## 2020-11-18 MED ORDER — CLOPIDOGREL BISULFATE 75 MG PO TABS: 75.0000 mg | ORAL_TABLET | Freq: Every day | ORAL | 1 refills | Status: DC

## 2020-11-18 NOTE — Progress Notes (Signed)
CARDIAC REHAB PHASE I   PRE:  Rate/Rhythm: 74 SR  BP:  Supine: 155/74  Sitting:   Standing:    SaO2: 93%RA  MODE:  Ambulation: 470 ft   POST:  Rate/Rhythm: 987 SR  BP:  Supine: 163/63  Sitting:   Standing:    SaO2: 96%RA 0800-0835 Pt walked 470 ft on RA with hand held asst. Tolerated well. Offered CRP 2 and pt politely declined as does not like to drive much. Gave walking instructions for ex and heart healthy diet. Encouraged her to watch sodium. Understanding voiced.   Graylon Good, RN BSN  11/18/2020 8:30 AM

## 2020-11-18 NOTE — Progress Notes (Signed)
Nursing DC note Vitals stable. NO pain. Piv dcd. Ccmd made aware of discharge order. Patient alert and oriented. Verbalized understanding of instructions. Daughter misty also verbalized understanding of instructions. All belongings given to patient. Patient to be transported via wheelchair  to go home with daughter

## 2020-11-18 NOTE — Discharge Summary (Addendum)
[facility] VALVE TEAM  Discharge Summary    Patient ID: Sierra Carpenter MRN: [id number]; DOB: 10/25/1945  Admit date: 11/17/2020 Discharge date: 11/18/2020  Primary Care Provider: Ronnell Freshwater, NP  Primary Cardiologist: Sanda Klein, MD / Dr. Burt Knack & Dr. Cyndia Bent (TAVR)  Discharge Diagnoses    Principal Problem:   S/P TAVR (transcatheter aortic valve replacement) Active Problems:   Essential hypertension   Hypercholesterolemia   Severe aortic stenosis   Class 1 obesity due to excess calories with serious comorbidity and body mass index (BMI) of 31.0 to 31.9 in adult   Allergies Allergies  Allergen Reactions   Hydrochlorothiazide     Hypercalcemia     Diagnostic Studies/Procedures    TAVR OPERATIVE NOTE     Date of Procedure:                11/17/2020   Preoperative Diagnosis:      Severe Aortic Stenosis    Postoperative Diagnosis:    Same    Procedure:        Transcatheter Aortic Valve Replacement - Percutaneous Left Transfemoral Approach             Edwards Sapien 3 Ultra THV (size 23 mm, model # [id number], serial # [id number])              Co-Surgeons:                        Gaye Pollack, MD and Sherren Mocha, MD       Anesthesiologist:                  Collins Scotland, MD   Echocardiographer:              Osborne Oman, MD   Pre-operative Echo Findings: Severe aortic stenosis Normal left ventricular systolic function  _____________    Echo 11/18/2020: completed but pending formal read at the time of discharge   History of Present Illness     Sierra Carpenter is a 75 y.o. female with a history of HTN, HLD and critical AS who presented to [facility] on 11/17/2020 for planned TAVR.   She had an echocardiogram on 03/05/2020 showing an indeterminate number of aortic valve cusp.  There is severe calcification and thickening of the aortic valve.  The mean gradient was 61 mmHg with an aortic valve area of 0.55 cm.  Left  ventricular ejection fraction was 65 to 70% with grade 1 diastolic dysfunction and mild LVH.  She recently started having exertional shortness of breath and weakness with some dizziness when working in her yard during warm weather. Her most recent echo on 09/09/2020 showed a trileaflet aortic valve with severe calcification and thickening.  The aortic valve mean gradient was 42.5 mmHg with a peak gradient of 83.5 mmHg.  Peak velocity was 4.57 m/s.  Aortic valve area was 0.67 cm.  There is mild aortic insufficiency.  Left ventricular ejection fraction was 70 to 75%. [facility] 10/22/20 showed patent coronary arteries with mild calcification and luminal irregularity, no significant stenoses. Normal right heart pressures. There was critical aortic stenosis with mean gradient 78 mmHg, peak to peak gradient 86 mmHg, calculated aortic valve area 0.37 cm.  The patient has been evaluated by the multidisciplinary valve team and felt to have severe, symptomatic aortic stenosis and to be a suitable candidate for TAVR, which was set up for 11/17/20.   Hospital Course  Consultants: none   Severe AS: s/p successful TAVR with a 23 mm Edwards Sapien 3 Ultra  THV via the TF approach on 11/17/20. Post operative echo completed but pending formal read. Groin sites are stable. ECG with sinus and mild QRS widening (108 ms) and no high grade heart block. She was started on Plavix 75 mg daily without concurrent Asprin therapy given need for Mobic for arthritis pain. Plan for discharge home today with close follow up in the office.   HTN: BP elevated. Resumed on home meds.    Thyroid nodules: pre TAVR CT showed bilateral thyroid nodules, largest 2.3 cm on the left and recommended thyroid US. This will be discussed in the outpatient setting.   _____________  Discharge Vitals Blood pressure (!) 145/57, pulse 72, temperature 98.3 F (36.8 C), temperature source Oral, resp. rate 20, height '5\' 5"'$  (1.651 m), weight 76.4 kg, SpO2 91  %.  Filed Weights   11/17/20 0554 11/17/20 1211 11/18/20 0500  Weight: 74.4 kg 75.1 kg 76.4 kg    GEN: Well nourished, well developed, in no acute distress HEENT: normal Neck: no JVD or masses Cardiac: RRR; soft flow murmur. No rubs, or gallops,no edema  Respiratory:  clear to auscultation bilaterally, normal work of breathing GI: soft, nontender, nondistended, + BS MS: no deformity or atrophy Skin: warm and dry, no rash. . Groin sites clear without hematoma or ecchymosis  Neuro:  Alert and Oriented x 3, Strength and sensation are intact Psych: euthymic mood, full affect   Labs & Radiologic Studies    CBC Recent Labs    11/17/20 0951 11/18/20 0634  WBC  --  12.1*  HGB 12.2 14.8  HCT 36.0 42.3  MCV  --  90.4  PLT  --  99991111   Basic Metabolic Panel Recent Labs    11/17/20 0951 11/18/20 0634  NA 141 136  K 3.6 3.9  CL 107 104  CO2  --  24  GLUCOSE 128* 110*  BUN 19 16  CREATININE 0.80 0.86  CALCIUM  --  9.0   Liver Function Tests No results for input(s): AST, ALT, ALKPHOS, BILITOT, PROT, ALBUMIN in the last 72 hours. No results for input(s): LIPASE, AMYLASE in the last 72 hours. Cardiac Enzymes No results for input(s): CKTOTAL, CKMB, CKMBINDEX, TROPONINI in the last 72 hours. BNP Invalid input(s): POCBNP D-Dimer No results for input(s): DDIMER in the last 72 hours. Hemoglobin A1C No results for input(s): HGBA1C in the last 72 hours. Fasting Lipid Panel No results for input(s): CHOL, HDL, LDLCALC, TRIG, CHOLHDL, LDLDIRECT in the last 72 hours. Thyroid Function Tests No results for input(s): TSH, T4TOTAL, T3FREE, THYROIDAB in the last 72 hours.  Invalid input(s): FREET3 _____________  DG Chest 2 View  Result Date: 11/15/2020 CLINICAL DATA:  Preop TAVR.  Asymptomatic. EXAM: CHEST - 2 VIEW COMPARISON:  08/06/2014 FINDINGS: Lungs are adequately inflated without focal airspace consolidation or effusion. Cardiomediastinal silhouette, bones and soft tissues are  unremarkable. IMPRESSION: No active cardiopulmonary disease. Electronically Signed   By: Marin Olp M.D.   On: 11/15/2020 12:39   CARDIAC CATHETERIZATION  Result Date: 10/22/2020 Formatting of this result is different from the original.   There is severe aortic valve stenosis. 1.  Patent coronary arteries with mild calcification and luminal irregularity, no significant stenoses 2.  Normal right heart pressures 3.  Critical aortic stenosis with mean gradient 78 mmHg, peak to peak gradient 86 mmHg, calculated aortic valve area 0.37 cm. Recommendations: expedited workup for  treatment of severe aortic stenosis   CT CORONARY MORPH W/CTA COR W/SCORE W/CA W/CM &/OR WO/CM  Addendum Date: 10/29/2020   ADDENDUM REPORT: 10/29/2020 14:24 EXAM: OVER-READ INTERPRETATION  CT CHEST The following report is an over-read performed by radiologist Dr. Samara Snide [facility] [place] on 10/29/2020. This over-read does not include interpretation of cardiac or coronary anatomy or pathology. The coronary CTA interpretation by the cardiologist is attached. COMPARISON:  None. FINDINGS: Please see the separate concurrent chest CT angiogram report for details. IMPRESSION: Please see the separate concurrent chest CT angiogram report for details. Electronically Signed   By: Ilona Sorrel M.D.   On: 10/29/2020 14:24   Result Date: 10/29/2020 CLINICAL DATA:  Aortic stenosis EXAM: Cardiac TAVR CT TECHNIQUE: The patient was scanned on a Siemens Force [id number] slice scanner. A 120 kV retrospective scan was triggered in the descending thoracic aorta at 111 HU's. Gantry rotation speed was 270 msecs and collimation was .9 mm. No beta blockade or nitro were given. The 3D data set was reconstructed in 5% intervals of the R-R cycle. Systolic and diastolic phases were analyzed on a dedicated work station using MPR, MIP and VRT modes. The patient received 100 cc of contrast. FINDINGS: Aortic Valve: Severely thickened aortic valve with heavy  calcification and reduced excursion the planimeter valve area is 0.786 Sq cm consistent with severe aortic stenosis Number of leaflets: 3 LVOT calcification: None Annular calcification: Mild Prosthetic Valve: NA Left ventricle: LVEF 60% without evidence of wall motion abnormality Aortic Annulus Measurements Major annulus diameter: 25 mm Minor annulus diameter:20 mm Annular perimeter: 69 mm Annular area: 3.7 sq cm Aortic Root Measurements Sinus of Valsalva perimeter: 92 mm Sinotubular Junction: 28 mm Ascending Thoracic Aorta: 38 mm Aortic Arch: 29 mm Descending Thoracic Aorta: 20 mm Sinus of Valsalva Measurements: Right coronary cusp width: 24 mm Left coronary cusp width: 29 mm Non coronary cusp width: 29 mm Coronary Artery Height above Annulus: Left Main: 13 mm Right Coronary: 12 mm Optimum Fluoroscopic Angle for Delivery: LAO 21 CAU 0 Valves for structural team consideration: 26 mm CoreValve, 23 mm Edwards Sapien IMPRESSION: 1. Severe Aortic stenosis. Findings pertinent to TAVR procedure are detailed above. Mahesh  Chandrasekhar Electronically Signed: By: Rudean Haskell MD On: 10/29/2020 14:11   MM 3D SCREEN BREAST BILATERAL  Result Date: 11/10/2020 CLINICAL DATA:  Screening. EXAM: DIGITAL SCREENING BILATERAL MAMMOGRAM WITH TOMOSYNTHESIS AND CAD TECHNIQUE: Bilateral screening digital craniocaudal and mediolateral oblique mammograms were obtained. Bilateral screening digital breast tomosynthesis was performed. The images were evaluated with computer-aided detection. COMPARISON:  Previous exam(s). ACR Breast Density Category b: There are scattered areas of fibroglandular density. FINDINGS: There are no findings suspicious for malignancy. IMPRESSION: No mammographic evidence of malignancy. A result letter of this screening mammogram will be mailed directly to the patient. RECOMMENDATION: Screening mammogram in one year. (Code:SM-B-01Y) BI-RADS CATEGORY  1: Negative. Electronically Signed   By: Audie Pinto M.D.   On: 11/10/2020 15:26   CT ANGIO CHEST AORTA W/CM & OR WO/CM  Result Date: 10/29/2020 CLINICAL DATA:  Non rheumatic aortic stenosis.  TAVR planning. EXAM: CT ANGIOGRAPHY CHEST, ABDOMEN AND PELVIS TECHNIQUE: Multidetector CT imaging through the chest, abdomen and pelvis was performed using the standard protocol during bolus administration of intravenous contrast. Multiplanar reconstructed images and MIPs were obtained and reviewed to evaluate the vascular anatomy. CONTRAST:  176m OMNIPAQUE IOHEXOL 350 MG/ML SOLN COMPARISON:  None. FINDINGS: CTA CHEST FINDINGS Cardiovascular: Mild cardiomegaly. Diffuse thickening and coarse calcification  of the aortic valve. No significant pericardial effusion/thickening. Atherosclerotic thoracic aorta with dilated 4.0 cm diameter ascending thoracic aorta. Normal caliber main pulmonary artery. No central pulmonary emboli. Mediastinum/Nodes: Bilateral thyroid nodules, largest a hypodense 2.3 cm left thyroid nodule. Unremarkable esophagus. No pathologically enlarged axillary, mediastinal or hilar lymph nodes. Lungs/Pleura: No pneumothorax. No pleural effusion. No acute consolidative airspace disease, lung masses or significant pulmonary nodules. Musculoskeletal: No aggressive appearing focal osseous lesions. Mild thoracic spondylosis. CTA ABDOMEN AND PELVIS FINDINGS Hepatobiliary: Normal liver with no liver mass. Cholecystectomy. No biliary ductal dilatation. Pancreas: Normal, with no mass or duct dilation. Spleen: Normal size. No mass. Adrenals/Urinary Tract: Normal adrenals. No contour deforming renal masses. No hydronephrosis. Normal bladder. Stomach/Bowel: Small hiatal hernia. Otherwise normal nondistended stomach. Normal caliber small bowel with no small bowel wall thickening. Appendectomy. Marked diffuse colonic diverticulosis, most prominent in the sigmoid colon, with no definite large bowel wall thickening or significant pericolonic fat stranding.  Vascular/Lymphatic: Atherosclerotic nonaneurysmal abdominal aorta. No pathologically enlarged lymph nodes in the abdomen or pelvis. Reproductive: Status post hysterectomy, with no abnormal findings at the vaginal cuff. No adnexal mass. Other: No pneumoperitoneum, ascites or focal fluid collection. Musculoskeletal: No aggressive appearing focal osseous lesions. Marked lumbar spondylosis. Sclerosis throughout the L2 vertebral body with associated trabecular thickening, suggesting Paget's disease. VASCULAR MEASUREMENTS PERTINENT TO TAVR: AORTA: Minimal Aortic Diameter-10.8 x 10.3 mm Severity of Aortic Calcification-mild-to-moderate RIGHT PELVIS: Right Common Iliac Artery - Minimal Diameter-6.8 x 5.5 mm Tortuosity-mild Calcification-mild-to-moderate Right External Iliac Artery - Minimal Diameter-5.6 x 5.4 mm Tortuosity-mild-to-moderate Calcification-none Right Common Femoral Artery - Minimal Diameter-6.1 x 6.0 mm Tortuosity-mild Calcification-none LEFT PELVIS: Left Common Iliac Artery - Minimal Diameter-7.5 x 6.8 mm Tortuosity-mild-to-moderate Calcification-mild Left External Iliac Artery - Minimal Diameter-5.9 x 5.5 mm Tortuosity-mild to moderate Calcification-none Left Common Femoral Artery - Minimal Diameter-6.5 x 6.5 mm Tortuosity-mild Calcification-none Review of the MIP images confirms the above findings. IMPRESSION: 1. Vascular findings and measurements pertinent to potential TAVR procedure, as detailed. 2. Diffuse thickening and coarse calcification of the aortic valve, compatible with reported aortic stenosis. 3. Bilateral thyroid nodules, largest 2.3 cm on the left. Recommend thyroid US (ref: J Am Coll Radiol. 2015 Feb;12(2): 143-50). 4. Mild cardiomegaly. 5. Marked diffuse colonic diverticulosis. 6. Small hiatal hernia. 7. Sclerosis throughout the L2 vertebral body with associated trabecular thickening, suggesting Paget's disease. 8. Aortic Atherosclerosis (ICD10-I70.0). Electronically Signed   By: Ilona Sorrel M.D.   On: 10/29/2020 14:23   VAS US CAROTID  Result Date: 10/29/2020 Carotid Arterial Duplex Study Patient Name:  TYLEE FERRINI  Date of Exam:   10/29/2020 Medical Rec #: [id number]      Accession #:    [id number] Date of Birth: [birth year]     Patient Gender: F Patient Age:   74Y Exam Location:  [facility] Procedure:      VAS US CAROTID Referring Phys: [id number] MICHAEL COOPER --------------------------------------------------------------------------------  Indications:       Pre-TAVR evaluation. Risk Factors:      Hypertension, hyperlipidemia. Comparison Study:  No prior study Performing Technologist: Maudry Mayhew MHA, RDMS, RVT, RDCS  Examination Guidelines: A complete evaluation includes B-mode imaging, spectral Doppler, color Doppler, and power Doppler as needed of all accessible portions of each vessel. Bilateral testing is considered an integral part of a complete examination. Limited examinations for reoccurring indications may be performed as noted.  Right Carotid Findings: +----------+--------+--------+--------+----------------------+--------+           PSV cm/sEDV cm/sStenosisPlaque Description  Comments +----------+--------+--------+--------+----------------------+--------+ CCA Prox  71      16                                             +----------+--------+--------+--------+----------------------+--------+ CCA Distal67      15                                             +----------+--------+--------+--------+----------------------+--------+ ICA Prox  43      11              focal and heterogenous         +----------+--------+--------+--------+----------------------+--------+ ICA Distal163     33                                             +----------+--------+--------+--------+----------------------+--------+ ECA       68      8                                               +----------+--------+--------+--------+----------------------+--------+ +----------+--------+-------+----------------+-------------------+           PSV cm/sEDV cmsDescribe        Arm Pressure (mmHG) +----------+--------+-------+----------------+-------------------+ [id number]             Multiphasic, WNL                    +----------+--------+-------+----------------+-------------------+ +---------+--------+--+--------+-+---------+ VertebralPSV cm/s43EDV cm/s8Antegrade +---------+--------+--+--------+-+---------+  Left Carotid Findings: +----------+--------+--------+--------+----------------------+--------+           PSV cm/sEDV cm/sStenosisPlaque Description    Comments +----------+--------+--------+--------+----------------------+--------+ CCA Prox  86      17                                             +----------+--------+--------+--------+----------------------+--------+ CCA Distal72      16                                             +----------+--------+--------+--------+----------------------+--------+ ICA Prox  55      14              focal and heterogenous         +----------+--------+--------+--------+----------------------+--------+ ICA Distal112     30                                             +----------+--------+--------+--------+----------------------+--------+ ECA       79      8                                              +----------+--------+--------+--------+----------------------+--------+ +----------+--------+--------+----------------+-------------------+  PSV cm/sEDV cm/sDescribe        Arm Pressure (mmHG) +----------+--------+--------+----------------+-------------------+ [id number]             Multiphasic, WNL                    +----------+--------+--------+----------------+-------------------+ +---------+--------+--+--------+--+---------+ VertebralPSV cm/s51EDV cm/s12Antegrade  +---------+--------+--+--------+--+---------+   Summary: Right Carotid: The extracranial vessels were near-normal with only minimal wall                thickening or plaque. Left Carotid: The extracranial vessels were near-normal with only minimal wall               thickening or plaque. Vertebrals:  Bilateral vertebral arteries demonstrate antegrade flow. Subclavians: Normal flow hemodynamics were seen in bilateral subclavian              arteries. *See table(s) above for measurements and observations.  Electronically signed by Servando Snare MD on 10/29/2020 at 2:49:05 PM.    Final    ECHOCARDIOGRAM LIMITED  Result Date: 11/17/2020    ECHOCARDIOGRAM LIMITED REPORT   Patient Name:   ATENA SAYRE Date of Exam: 11/17/2020 Medical Rec #:  [id number]     Height:       61.0 in Accession #:    [id number]    Weight:       164.0 lb Date of Birth:  12-Sep-1945    BSA:          1.736 m Patient Age:    25 years      BP:           176/75 mmHg Patient Gender: F             HR:           50 bpm. Exam Location:  Inpatient Procedure: Limited Echo, Color Doppler and Cardiac Doppler Indications:     Aortic Stenosis i35.0  History:         Patient has prior history of Echocardiogram examinations, most                  recent 09/09/2020. Risk Factors:Hypertension and Dyslipidemia.  Sonographer:     Raquel Sarna Senior RDCS Referring Phys:  [place] Diagnosing Phys: Rudean Haskell MD  Sonographer Comments: 38m Edwards S3U TAVR Implanted IMPRESSIONS  1. During procedure the aortic valve has been repaired/replaced with a 23 mm Ewdwards Sapien valve. Resolution of aortic stenosis with trivial perivalvular leak at that 11 O'clock position (with wire). Aortic valve mean gradient measures 2.0 mmHg. No evidence of post deployment systolic anterior motion of the mitral valve.  2. Left ventricular ejection fraction, by estimation, is 65 to 70%. The left ventricle has normal function. The left ventricle has no regional wall motion  abnormalities. 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  3. The mitral valve is grossly normal. No evidence of mitral valve regurgitation. Comparison(s): A prior study was performed on 09/09/20. Successful TAVR implantation. FINDINGS  Left Ventricle: Left ventricular ejection fraction, by estimation, is 65 to 70%. The left ventricle has normal function. The left ventricle has no regional wall motion abnormalities. The left ventricular internal cavity size was normal in size. 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 Mitral Valve: The mitral valve is grossly normal. Aortic Valve: The aortic valve has been repaired/replaced. Aortic valve mean gradient measures 2.0 mmHg. Aortic valve peak gradient measures 3.4 mmHg. Aortic valve area, by VTI measures 2.35 cm.  There is a 23 mm Sapien prosthetic, stented (TAVR) valve present in the aortic position. LEFT VENTRICLE PLAX 2D LVOT diam:     2.00 cm LV SV:         57 LV SV Index:   33 LVOT Area:     3.14 cm  AORTIC VALVE AV Area (Vmax):    3.12 cm AV Area (Vmean):   2.71 cm AV Area (VTI):     2.35 cm AV Vmax:           92.55 cm/s AV Vmean:          65.000 cm/s AV VTI:            0.240 m AV Peak Grad:      3.4 mmHg AV Mean Grad:      2.0 mmHg LVOT Vmax:         92.00 cm/s LVOT Vmean:        56.000 cm/s LVOT VTI:          0.180 m LVOT/AV VTI ratio: 0.75  SHUNTS Systemic VTI:  0.18 m Systemic Diam: 2.00 cm Rudean Haskell MD Electronically signed by Rudean Haskell MD Signature Date/Time: 11/17/2020/10:35:18 AM    Final    Structural Heart Procedure  Result Date: 11/17/2020 See surgical note for result.  CT ANGIO ABDOMEN PELVIS  W &/OR WO CONTRAST  Result Date: 10/29/2020 CLINICAL DATA:  Non rheumatic aortic stenosis.  TAVR planning. EXAM: CT ANGIOGRAPHY CHEST, ABDOMEN AND PELVIS TECHNIQUE: Multidetector CT imaging through the chest, abdomen and pelvis was performed using  the standard protocol during bolus administration of intravenous contrast. Multiplanar reconstructed images and MIPs were obtained and reviewed to evaluate the vascular anatomy. CONTRAST:  173m OMNIPAQUE IOHEXOL 350 MG/ML SOLN COMPARISON:  None. FINDINGS: CTA CHEST FINDINGS Cardiovascular: Mild cardiomegaly. Diffuse thickening and coarse calcification of the aortic valve. No significant pericardial effusion/thickening. Atherosclerotic thoracic aorta with dilated 4.0 cm diameter ascending thoracic aorta. Normal caliber main pulmonary artery. No central pulmonary emboli. Mediastinum/Nodes: Bilateral thyroid nodules, largest a hypodense 2.3 cm left thyroid nodule. Unremarkable esophagus. No pathologically enlarged axillary, mediastinal or hilar lymph nodes. Lungs/Pleura: No pneumothorax. No pleural effusion. No acute consolidative airspace disease, lung masses or significant pulmonary nodules. Musculoskeletal: No aggressive appearing focal osseous lesions. Mild thoracic spondylosis. CTA ABDOMEN AND PELVIS FINDINGS Hepatobiliary: Normal liver with no liver mass. Cholecystectomy. No biliary ductal dilatation. Pancreas: Normal, with no mass or duct dilation. Spleen: Normal size. No mass. Adrenals/Urinary Tract: Normal adrenals. No contour deforming renal masses. No hydronephrosis. Normal bladder. Stomach/Bowel: Small hiatal hernia. Otherwise normal nondistended stomach. Normal caliber small bowel with no small bowel wall thickening. Appendectomy. Marked diffuse colonic diverticulosis, most prominent in the sigmoid colon, with no definite large bowel wall thickening or significant pericolonic fat stranding. Vascular/Lymphatic: Atherosclerotic nonaneurysmal abdominal aorta. No pathologically enlarged lymph nodes in the abdomen or pelvis. Reproductive: Status post hysterectomy, with no abnormal findings at the vaginal cuff. No adnexal mass. Other: No pneumoperitoneum, ascites or focal fluid collection. Musculoskeletal: No  aggressive appearing focal osseous lesions. Marked lumbar spondylosis. Sclerosis throughout the L2 vertebral body with associated trabecular thickening, suggesting Paget's disease. VASCULAR MEASUREMENTS PERTINENT TO TAVR: AORTA: Minimal Aortic Diameter-10.8 x 10.3 mm Severity of Aortic Calcification-mild-to-moderate RIGHT PELVIS: Right Common Iliac Artery - Minimal Diameter-6.8 x 5.5 mm Tortuosity-mild Calcification-mild-to-moderate Right External Iliac Artery - Minimal Diameter-5.6 x 5.4 mm Tortuosity-mild-to-moderate Calcification-none Right Common Femoral Artery - Minimal Diameter-6.1 x 6.0 mm Tortuosity-mild Calcification-none LEFT PELVIS: Left Common Iliac Artery -  Minimal Diameter-7.5 x 6.8 mm Tortuosity-mild-to-moderate Calcification-mild Left External Iliac Artery - Minimal Diameter-5.9 x 5.5 mm Tortuosity-mild to moderate Calcification-none Left Common Femoral Artery - Minimal Diameter-6.5 x 6.5 mm Tortuosity-mild Calcification-none Review of the MIP images confirms the above findings. IMPRESSION: 1. Vascular findings and measurements pertinent to potential TAVR procedure, as detailed. 2. Diffuse thickening and coarse calcification of the aortic valve, compatible with reported aortic stenosis. 3. Bilateral thyroid nodules, largest 2.3 cm on the left. Recommend thyroid US (ref: J Am Coll Radiol. 2015 Feb;12(2): 143-50). 4. Mild cardiomegaly. 5. Marked diffuse colonic diverticulosis. 6. Small hiatal hernia. 7. Sclerosis throughout the L2 vertebral body with associated trabecular thickening, suggesting Paget's disease. 8. Aortic Atherosclerosis (ICD10-I70.0). Electronically Signed   By: Ilona Sorrel M.D.   On: 10/29/2020 14:23   Disposition   Pt is being discharged home today in good condition.  Follow-up Plans & Appointments     Follow-up Information     Eileen Stanford, PA-C. Go on 12/02/2020.   Specialties: Cardiology, Radiology Why: @ 2:30pm, please arrive at least 10 minutes early Contact  information: [place] [id number] [phone]                  Discharge Medications   Allergies as of 11/18/2020       Reactions   Hydrochlorothiazide    Hypercalcemia         Medication List     TAKE these medications    atorvastatin 80 MG tablet Commonly known as: LIPITOR Take 1 tablet by mouth once daily   clopidogrel 75 MG tablet Commonly known as: PLAVIX Take 1 tablet (75 mg total) by mouth daily with breakfast. Start taking on: November 19, 2020   CoQ10 100 MG Caps Take 100 mg by mouth in the morning and at bedtime.   fish oil-omega-3 fatty acids 1000 MG capsule Take 1,000 mg by mouth 3 (three) times daily with meals.   meloxicam 7.5 MG tablet Commonly known as: MOBIC Take 1 tablet by mouth once daily What changed:  how much to take when to take this   oxybutynin 5 MG tablet Commonly known as: DITROPAN Take 1 tablet (5 mg total) by mouth 2 (two) times daily.   PRESERVISION AREDS 2 PO Take 1 tablet by mouth in the morning and at bedtime.   Refresh Optive 1-0.9 % Gel Generic drug: Carboxymethylcellul-Glycerin Place 1 drop into both eyes at bedtime.   Refresh Optive Advanced 0.5-1-0.5 % Soln Generic drug: Carboxymeth-Glycerin-Polysorb Place 1 drop into both eyes every 3 (three) hours as needed (dry eye).   valsartan 80 MG tablet Commonly known as: DIOVAN Take 1 tablet (80 mg total) by mouth daily.   vitamin C 1000 MG tablet Take 1,000 mg by mouth in the morning and at bedtime. Morning & at lunch   Vitamin D3 25 MCG (1000 UT) Caps Take 1,000 Units by mouth 2 (two) times daily. Morning & at bedtime         Outstanding Labs/Studies   none  Duration of Discharge Encounter   Greater than 30 minutes including physician time.  SignedAngelena Form, PA-C 11/18/2020, 2:09 PM [phone]  Patient seen, examined. Available data reviewed. Agree with findings, assessment, and plan as outlined by  Nell Range, PA-C.  The patient is independently interviewed and examined.  She is alert, oriented, no distress.  Lungs are clear, JVP is normal, heart is regular rate and rhythm with a 2/6 systolic ejection murmur  at the right upper sternal border, abdomen is soft and nontender, bilateral groin sites are clear, extremities have no edema.  Echo images were personally reviewed.  LV function is vigorous.  There is no pericardial effusion.  There is no paravalvular regurgitation of the aortic prosthesis.  The mean gradient is 20 mmHg.  The patient is stable for discharge today with follow-up plans as outlined above.  Sherren Mocha, M.D. 11/18/2020 2:09 PM

## 2020-11-18 NOTE — Progress Notes (Signed)
Echocardiogram 2D Echocardiogram has been performed.  Oneal Deputy Daje Stark RDCS 11/18/2020, 10:04 AM

## 2020-11-19 ENCOUNTER — Telehealth: Payer: Self-pay | Admitting: Physician Assistant

## 2020-11-19 MED FILL — Magnesium Sulfate Inj 50%: INTRAMUSCULAR | Qty: 10 | Status: AC

## 2020-11-19 MED FILL — Heparin Sodium (Porcine) Inj 1000 Unit/ML: INTRAMUSCULAR | Qty: 30 | Status: AC

## 2020-11-19 MED FILL — Potassium Chloride Inj 2 mEq/ML: INTRAVENOUS | Qty: 40 | Status: AC

## 2020-11-19 NOTE — Telephone Encounter (Signed)
  [place] VALVE TEAM   Patient contacted regarding discharge from [facility] on 8/17  Patient understands to follow up with provider Nell Range on 8/31 at [facility].  Patient understands discharge instructions? yes Patient understands medications and regimen? yes Patient understands to bring all medications to this visit? yes  Angelena Form PA-C  [facility]

## 2020-11-26 ENCOUNTER — Ambulatory Visit: Payer: Medicare Other | Admitting: Physician Assistant

## 2020-11-28 NOTE — Addendum Note (Signed)
Encounter addended by: Annie Paras on: 11/28/2020 1:00 PM  Actions taken: Letter saved

## 2020-12-02 ENCOUNTER — Other Ambulatory Visit: Payer: Self-pay

## 2020-12-02 ENCOUNTER — Ambulatory Visit: Payer: Medicare Other | Admitting: Physician Assistant

## 2020-12-02 ENCOUNTER — Encounter: Payer: Self-pay | Admitting: Physician Assistant

## 2020-12-02 VITALS — BP 146/68 | HR 78 | Ht 65.0 in | Wt 169.0 lb

## 2020-12-02 DIAGNOSIS — E041 Nontoxic single thyroid nodule: Secondary | ICD-10-CM

## 2020-12-02 DIAGNOSIS — I10 Essential (primary) hypertension: Secondary | ICD-10-CM

## 2020-12-02 DIAGNOSIS — Z952 Presence of prosthetic heart valve: Secondary | ICD-10-CM | POA: Diagnosis not present

## 2020-12-02 MED ORDER — VALSARTAN 160 MG PO TABS: 160.0000 mg | ORAL_TABLET | Freq: Every day | ORAL | 3 refills | Status: DC

## 2020-12-02 NOTE — Patient Instructions (Signed)
Medication Instructions:  Your physician has recommended you make the following change in your medication:  1.) increase valsartan to 160 mg daily  *If you need a refill on your cardiac medications before your next appointment, please call your pharmacy*   Lab Work: none If you have labs (blood work) drawn today and your tests are completely normal, you will receive your results only by: [place] (if you have MyChart) OR A paper copy in the mail If you have any lab test that is abnormal or we need to change your treatment, we will call you to review the results.   Testing/Procedures: Thyroid Ultrasound - [facility]   Follow-Up: As planned

## 2020-12-02 NOTE — Progress Notes (Signed)
[place]                                     Cardiology Office Note:    Date:  12/02/2020   ID:  Sierra Carpenter, Sierra Carpenter [birth year], MRN [id number]  PCP:  Ronnell Freshwater, NP  [facility] Cardiologist:  Sanda Klein, MD  / Dr. Burt Knack & Dr. Cyndia Bent (TAVR) [facility] Electrophysiologist:  None   Referring MD: Ronnell Freshwater, NP   [facility] s/p TAVR  History of Present Illness:    Sierra Carpenter is a 75 y.o. 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  She had an echocardiogram on 03/05/2020 showing an indeterminate number of aortic valve cusp.  There is severe calcification and thickening of the aortic valve.  The mean gradient was 61 mmHg with an aortic valve area of 0.55 cm.  Left ventricular ejection fraction was 65 to 70% with grade 1 diastolic dysfunction and mild LVH.  She recently started having exertional shortness of breath and weakness with some dizziness when working in her yard during warm weather. Her most recent echo on 09/09/2020 showed a trileaflet aortic valve with severe calcification and thickening.  The aortic valve mean gradient was 42.5 mmHg with a peak gradient of 83.5 mmHg.  Peak velocity was 4.57 m/s.  Aortic valve area was 0.67 cm.  There is mild aortic insufficiency.  Left ventricular ejection fraction was 70 to 75%. [facility] 10/22/20 showed patent coronary arteries with mild calcification and luminal irregularity, no significant stenoses. Normal right heart pressures. There was critical aortic stenosis with mean gradient 78 mmHg, peak to peak gradient 86 mmHg, calculated aortic valve area 0.37 cm.   She was evaluated by the multidisciplinary valve team and underwent successful TAVR with a 23 mm Edwards Sapien 3 Ultra  THV via the TF approach on 11/17/20. Post operative echo showed EF 60%, normally functioning TAVR with a mean gradient of 18.5 mmHg and no PVL.  She was started on Plavix 75 mg daily without concurrent Asprin therapy given need for Mobic for arthritis pain.  Today the patient presents to clinic for follow up. Here with daughter. Doing great. Has been taking it easy. Eager to get back working in her yard which is where she noticed symptoms initially. Named her valve "Elsie." No CP or SOB. No LE edema, orthopnea or PND. No dizziness or syncope. No blood in stool or urine. No palpitations.   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 CBurt Knackand Dr. BCyndia Bent  Severe aortic stenosis    s/p TAVR 11/17/20    Past Surgical History:  Procedure Laterality Date   ABDOMINAL HYSTERECTOMY     fibroids; ovaries intact.   APPENDECTOMY     BILATERAL CARPAL TUNNEL RELEASE Bilateral    CARDIAC CATHETERIZATION     moderate stenosis in 1st diagonal & RCA   CESAREAN SECTION  CHOLECYSTECTOMY     EYE SURGERY Bilateral    cataracts   INNER EAR SURGERY Right    LITHOTRIPSY Right    RIGHT/LEFT HEART CATH AND CORONARY ANGIOGRAPHY N/A 10/21/2020   Procedure: RIGHT/LEFT HEART CATH AND CORONARY ANGIOGRAPHY;  Surgeon: Sherren Mocha, MD;  Location: [place] [facility];  Service: Cardiovascular;  Laterality: N/A;   TEE WITHOUT CARDIOVERSION N/A 11/17/2020   Procedure: TRANSESOPHAGEAL ECHOCARDIOGRAM (TEE);  Surgeon: Sherren Mocha, MD;  Location: [place];  Service: Open Heart Surgery;  Laterality: N/A;   TRANSCATHETER AORTIC VALVE REPLACEMENT, TRANSFEMORAL N/A 11/17/2020   Procedure: TRANSCATHETER AORTIC VALVE REPLACEMENT, TRANSFEMORAL;  Surgeon: Sherren Mocha, MD;  Location: [place];   Service: Open Heart Surgery;  Laterality: N/A;   TRANSTHORACIC ECHOCARDIOGRAM  07/12/2010   EF=>55% with normal systolic function; trace MR/TR/PR    Current Medications: Current Meds  Medication Sig   Ascorbic Acid (VITAMIN C) 1000 MG tablet Take 1,000 mg by mouth in the morning and at bedtime. Morning & at lunch   atorvastatin (LIPITOR) 80 MG tablet Take 1 tablet by mouth once daily   Carboxymeth-Glycerin-Polysorb (REFRESH OPTIVE ADVANCED) 0.5-1-0.5 % SOLN Place 1 drop into both eyes every 3 (three) hours as needed (dry eye).   Carboxymethylcellul-Glycerin (REFRESH OPTIVE) 1-0.9 % GEL Place 1 drop into both eyes at bedtime.   Cholecalciferol (VITAMIN D3) 25 MCG (1000 UT) CAPS Take 1,000 Units by mouth 2 (two) times daily. Morning & at bedtime   clopidogrel (PLAVIX) 75 MG tablet Take 1 tablet (75 mg total) by mouth daily with breakfast.   Coenzyme Q10 (COQ10) 100 MG CAPS Take 100 mg by mouth in the morning and at bedtime.   fish oil-omega-3 fatty acids 1000 MG capsule Take 1,000 mg by mouth 3 (three) times daily with meals.   meloxicam (MOBIC) 7.5 MG tablet Take 1 tablet by mouth once daily (Patient taking differently: Take 3.75 mg by mouth every other day.)   Multiple Vitamins-Minerals (PRESERVISION AREDS 2 PO) Take 1 tablet by mouth in the morning and at bedtime.   oxybutynin (DITROPAN) 5 MG tablet Take 1 tablet (5 mg total) by mouth 2 (two) times daily.   valsartan (DIOVAN) 160 MG tablet Take 1 tablet (160 mg total) by mouth daily.   [DISCONTINUED] valsartan (DIOVAN) 80 MG tablet Take 1 tablet (80 mg total) by mouth daily.     Allergies:   Hydrochlorothiazide   Social History   Socioeconomic History   Marital status: Widowed    Spouse name: Not on file   Number of children: 1   Years of education: 76   Highest education level: Not on file  Occupational History   Occupation: retired    Comment: Production designer, theatre/television/film  Tobacco Use   Smoking status: Never   Smokeless tobacco: Never   Vaping Use   Vaping Use: Never used  Substance and Sexual Activity   Alcohol use: No   Drug use: No   Sexual activity: Not Currently    Birth control/protection: Post-menopausal, Surgical  Other Topics Concern   Not on file  Social History Narrative   Marital status: widowed since 2014 due to COPD; married x 37 years.      Children: 1 daughter; 1 son died of CHF age 91 pacemaker.  4 grandchildren.      Lives: alone      Employment: retired at age 88 from [employer].      Tobacco:  never       Alcohol:  never  Exercise: yes in 2019; walking 1-2 miles daily.  DDD lumbar limiting walking.      ADLs: independent with ADLs; drives.        Advanced Directives: none; has paper.  FULL CODE; no prolonged measures.  HCPOA: daughter/Misty Wenn-Maness.   Social Determinants of Health   Financial Resource Strain: Not on file  Food Insecurity: Not on file  Transportation Needs: Not on file  Physical Activity: Not on file  Stress: Not on file  Social Connections: Not on file     Family History: The patient's family history includes Alzheimer's disease in her mother; COPD in her brother; Heart disease in her brother, brother, brother, paternal grandfather, and son; Heart disease (age of onset: 30) in her father. There is no history of Breast cancer.  ROS:   Please see the history of present illness.    All other systems reviewed and are negative.  EKGs/Labs/Other Studies Reviewed:    The following studies were reviewed today:  TAVR OPERATIVE NOTE     Date of Procedure:                11/17/2020   Preoperative Diagnosis:      Severe Aortic Stenosis    Postoperative Diagnosis:    Same    Procedure:        Transcatheter Aortic Valve Replacement - Percutaneous Left Transfemoral Approach             Edwards Sapien 3 Ultra THV (size 23 mm, model # 9750TFX, serial # [id number])              Co-Surgeons:                        Gaye Pollack, MD and Sherren Mocha, MD        Anesthesiologist:                  Collins Scotland, MD   Echocardiographer:              Osborne Oman, MD   Pre-operative Echo Findings: Severe aortic stenosis Normal left ventricular systolic function   _____________     Echo 11/18/2020: IMPRESSIONS   1. The aortic valve has been replaced with a 23 mm Edwards Sapient Valve  Aortic valve mean gradient measures 18.5 mmHg. DVI is 0.37.   2. Left ventricular ejection fraction, by estimation, is 60 to 65%. The  left ventricle has normal function.   Comparison(s): A prior study was performed on 11/17/2020. Increase in  prosthetic gradients with no evidence of paravalvular leak.   EKG:  EKG is ordered today.  The ekg ordered today demonstrates sinus HR 78  Recent Labs: 09/23/2020: TSH 2.670 11/13/2020: ALT 19 11/18/2020: BUN 16; Creatinine, Ser 0.86; Hemoglobin 14.8; Platelets 218; Potassium 3.9; Sodium 136  Recent Lipid Panel    Component Value Date/Time   CHOL 137 09/23/2020 0852   TRIG 112 09/23/2020 0852   HDL 41 09/23/2020 0852   CHOLHDL 3.3 09/23/2020 0852   CHOLHDL 3.2 11/18/2015 1111   VLDL 29 11/18/2015 1111   LDLCALC 76 09/23/2020 0852     Risk Assessment/Calculations:       Physical Exam:    VS:  BP (!) 146/68   Pulse 78   Ht '5\' 5"'$  (1.651 m)   Wt 169 lb (76.7 kg)   SpO2 97%   BMI 28.12 kg/m     Wt Readings from Last 3 Encounters:  12/02/20 169  lb (76.7 kg)  11/18/20 168 lb 6.4 oz (76.4 kg)  11/13/20 166 lb 8 oz (75.5 kg)     GEN: Well nourished, well developed in no acute distress HEENT: Normal NECK: No JVD LYMPHATICS: No lymphadenopathy CARDIAC: RRR, soft flow murmur. No rubs, gallops RESPIRATORY:  Clear to auscultation without rales, wheezing or rhonchi  ABDOMEN: Soft, non-tender, non-distended MUSCULOSKELETAL:  No edema; No deformity  SKIN: Warm and dry.  Groin sites clear without hematoma or ecchymosis  NEUROLOGIC:  Alert and oriented x 3 PSYCHIATRIC:  Normal affect   ASSESSMENT:    1. S/P  TAVR (transcatheter aortic valve replacement)   2. Essential hypertension   3. Thyroid nodule    PLAN:    In order of problems listed above:  Severe AS s/p TAVR: doing great. Groin sites healing well. ECG with no HAVB. Continue on plavix alone given need for Mobic. SBE prophylaxis discussed; the patient is edentulous and does not go to the dentist. I will see her back next month for follow up and echo.    HTN: BP elevated. Actually, 160/78 on my personal recheck. Says BP has been running higher at home as well. Will increase valsartan from '80mg'$  to '160mg'$  daily. Will check a BMET and next visit and review home log.    Thyroid nodules: pre TAVR CT showed bilateral thyroid nodules, largest 2.3 cm on the left and recommended thyroid US. Will get this set up    Medication Adjustments/Labs and Tests Ordered: Current medicines are reviewed at length with the patient today.  Concerns regarding medicines are outlined above.  Orders Placed This Encounter  Procedures   US THYROID   EKG 12-Lead    Meds ordered this encounter  Medications   valsartan (DIOVAN) 160 MG tablet    Sig: Take 1 tablet (160 mg total) by mouth daily.    Dispense:  90 tablet    Refill:  3    Dose increase     Patient Instructions  Medication Instructions:  Your physician has recommended you make the following change in your medication:  1.) increase valsartan to 160 mg daily  *If you need a refill on your cardiac medications before your next appointment, please call your pharmacy*   Lab Work: none If you have labs (blood work) drawn today and your tests are completely normal, you will receive your results only by: [place] (if you have MyChart) OR A paper copy in the mail If you have any lab test that is abnormal or we need to change your treatment, we will call you to review the results.   Testing/Procedures: Thyroid Ultrasound - [facility]   Follow-Up: As planned   Signed, Angelena Form, PA-C  12/02/2020 3:19 PM    [facility]

## 2020-12-08 ENCOUNTER — Encounter: Payer: Self-pay | Admitting: Nurse Practitioner

## 2020-12-11 ENCOUNTER — Ambulatory Visit (HOSPITAL_COMMUNITY)
Admission: RE | Admit: 2020-12-11 | Discharge: 2020-12-11 | Disposition: A | Discharge status: Home / Self Care | Payer: Medicare Other | Source: Ambulatory Visit | Attending: Physician Assistant | Admitting: Physician Assistant

## 2020-12-11 ENCOUNTER — Other Ambulatory Visit: Payer: Self-pay

## 2020-12-11 ENCOUNTER — Other Ambulatory Visit: Payer: Self-pay | Admitting: Physician Assistant

## 2020-12-11 DIAGNOSIS — Z952 Presence of prosthetic heart valve: Secondary | ICD-10-CM | POA: Insufficient documentation

## 2020-12-11 DIAGNOSIS — E041 Nontoxic single thyroid nodule: Secondary | ICD-10-CM | POA: Diagnosis present

## 2020-12-11 DIAGNOSIS — I10 Essential (primary) hypertension: Secondary | ICD-10-CM

## 2020-12-13 ENCOUNTER — Encounter: Payer: Self-pay | Admitting: Nurse Practitioner

## 2020-12-14 NOTE — Progress Notes (Signed)
The patient should be contacting the office to schedule appointment to review thyroid ultrasound and to get referral for biopsy. If she doesn't call in next few hours, can we reach out to her to schedule? Thanks. -HB

## 2020-12-14 NOTE — Progress Notes (Addendum)
Central                                     Cardiology Office Note:    Date:  12/16/2020   ID:  Sierra Carpenter, DOB [birth year], MRN [id number]  PCP:  Ronnell Freshwater, NP  [facility] Cardiologist:  Sanda Klein, MD  / Dr. Burt Knack & Dr. Cyndia Bent (TAVR) [facility] Electrophysiologist:  None   Referring MD: Ronnell Freshwater, NP   1 month s/p TAVR  History of Present Illness:    Sierra Carpenter is a 75 y.o. 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  She had an echocardiogram on 03/05/2020 showing an indeterminate number of aortic valve cusp.  There was severe calcification and thickening of the aortic valve.  The mean gradient was 61 mmHg with an aortic valve area of 0.55 cm.  Left ventricular ejection fraction was 65 to 70% with grade 1 diastolic dysfunction and mild LVH.  She recently started having exertional shortness of breath and weakness with some dizziness when working in her yard during warm weather. Her most recent echo on 09/09/2020 showed a trileaflet aortic valve with severe calcification and thickening.  The aortic valve mean gradient was 42.5 mmHg with a peak gradient of 83.5 mmHg.  Peak velocity was 4.57 m/s.  Aortic valve area was 0.67 cm.  There is mild aortic insufficiency.  Left ventricular ejection fraction was 70 to 75%. [facility] 10/22/20 showed patent coronary arteries with mild calcification and luminal irregularity, no significant stenoses. Normal right heart pressures. There was critical aortic stenosis with mean gradient 78 mmHg, peak to peak gradient 86 mmHg, calculated aortic valve area 0.37 cm.   She was evaluated by the multidisciplinary valve team and underwent successful TAVR with a 23 mm Edwards Sapien 3 Ultra THV via the TF approach on 11/17/20. Post operative echo showed EF 60%, normally functioning TAVR with a mean gradient of 18.5 mmHg and no  PVL. She was started on Plavix 75 mg daily without concurrent Asprin therapy given need for Mobic for arthritis pain. Has done quite well in follow up and named her valve Legrand Rams. BP was elevated last visit and Valsartan was increased from '80mg'$  to '160mg'$  daily.   Today the patient presents to clinic for follow up. She is here with daughter and doing great. Out mowing the yard. No CP or SOB. No LE edema, orthopnea or PND. No dizziness or syncope. No blood in stool or urine. No palpitations.   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 CBurt Knackand Dr. BCyndia Bent  Severe aortic stenosis    s/p TAVR 11/17/20    Past Surgical History:  Procedure Laterality Date   ABDOMINAL HYSTERECTOMY     fibroids; ovaries intact.   APPENDECTOMY     BILATERAL CARPAL TUNNEL RELEASE Bilateral    CARDIAC CATHETERIZATION     moderate  stenosis in 1st diagonal & RCA   CESAREAN SECTION     CHOLECYSTECTOMY     EYE SURGERY Bilateral    cataracts   INNER EAR SURGERY Right    LITHOTRIPSY Right    RIGHT/LEFT HEART CATH AND CORONARY ANGIOGRAPHY N/A 10/21/2020   Procedure: RIGHT/LEFT HEART CATH AND CORONARY ANGIOGRAPHY;  Surgeon: Sherren Mocha, MD;  Location: [facility];  Service: Cardiovascular;  Laterality: N/A;   TEE WITHOUT CARDIOVERSION N/A 11/17/2020   Procedure: TRANSESOPHAGEAL ECHOCARDIOGRAM (TEE);  Surgeon: Sherren Mocha, MD;  Location: [place];  Service: Open Heart Surgery;  Laterality: N/A;   TRANSCATHETER AORTIC VALVE REPLACEMENT, TRANSFEMORAL N/A 11/17/2020   Procedure: TRANSCATHETER AORTIC VALVE REPLACEMENT, TRANSFEMORAL;  Surgeon:  Sherren Mocha, MD;  Location: [place];  Service: Open Heart Surgery;  Laterality: N/A;   TRANSTHORACIC ECHOCARDIOGRAM  07/12/2010   EF=>55% with normal systolic function; trace MR/TR/PR    Current Medications: Current Meds  Medication Sig   Ascorbic Acid (VITAMIN C) 1000 MG tablet Take 1,000 mg by mouth in the morning and at bedtime. Morning & at lunch   atorvastatin (LIPITOR) 80 MG tablet Take 1 tablet by mouth once daily   Carboxymeth-Glycerin-Polysorb (REFRESH OPTIVE ADVANCED) 0.5-1-0.5 % SOLN Place 1 drop into both eyes every 3 (three) hours as needed (dry eye).   Carboxymethylcellul-Glycerin (REFRESH OPTIVE) 1-0.9 % GEL Place 1 drop into both eyes at bedtime.   Cholecalciferol (VITAMIN D3) 25 MCG (1000 UT) CAPS Take 1,000 Units by mouth 2 (two) times daily. Morning & at bedtime   clopidogrel (PLAVIX) 75 MG tablet Take 1 tablet (75 mg total) by mouth daily with breakfast.   Coenzyme Q10 (COQ10) 100 MG CAPS Take 100 mg by mouth in the morning and at bedtime.   fish oil-omega-3 fatty acids 1000 MG capsule Take 1,000 mg by mouth 3 (three) times daily with meals.   meloxicam (MOBIC) 7.5 MG tablet Take 1 tablet by mouth once daily (Patient taking differently: Take 3.75 mg by mouth every other day.)   Multiple Vitamins-Minerals (PRESERVISION AREDS 2 PO) Take 1 tablet by mouth in the morning and at bedtime.   oxybutynin (DITROPAN) 5 MG tablet Take 1 tablet (5 mg total) by mouth 2 (two) times daily.   valsartan (DIOVAN) 160 MG tablet Take 1 tablet (160 mg total) by mouth daily.     Allergies:   Hydrochlorothiazide   Social History   Socioeconomic History   Marital status: Widowed    Spouse name: Not on file   Number of children: 1   Years of education: 16   Highest education level: Not on file  Occupational History   Occupation: retired    Comment: Production designer, theatre/television/film  Tobacco Use   Smoking status: Never   Smokeless tobacco: Never  Vaping Use   Vaping Use: Never used  Substance and  Sexual Activity   Alcohol use: No   Drug use: No   Sexual activity: Not Currently    Birth control/protection: Post-menopausal, Surgical  Other Topics Concern   Not on file  Social History Narrative   Marital status: widowed since 2014 due to COPD; married x 37 years.      Children: 1 daughter; 1 son died of CHF age 11 pacemaker.  4 grandchildren.      Lives: alone      Employment: retired at age 22 from [employer].      Tobacco:  never       Alcohol:  never  Exercise: yes in 2019; walking 1-2 miles daily.  DDD lumbar limiting walking.      ADLs: independent with ADLs; drives.        Advanced Directives: none; has paper.  FULL CODE; no prolonged measures.  HCPOA: daughter/Misty Wenn-Maness.   Social Determinants of Health   Financial Resource Strain: Not on file  Food Insecurity: Not on file  Transportation Needs: Not on file  Physical Activity: Not on file  Stress: Not on file  Social Connections: Not on file     Family History: The patient's family history includes Alzheimer's disease in her mother; COPD in her brother; Heart disease in her brother, brother, brother, paternal grandfather, and son; Heart disease (age of onset: 39) in her father. There is no history of Breast cancer.  ROS:   Please see the history of present illness.    All other systems reviewed and are negative.  EKGs/Labs/Other Studies Reviewed:    The following studies were reviewed today:  TAVR OPERATIVE NOTE     Date of Procedure:                11/17/2020   Preoperative Diagnosis:      Severe Aortic Stenosis    Postoperative Diagnosis:    Same    Procedure:        Transcatheter Aortic Valve Replacement - Percutaneous Left Transfemoral Approach             Edwards Sapien 3 Ultra THV (size 23 mm, model # 9750TFX, serial # [id number])              Co-Surgeons:                        Gaye Pollack, MD and Sherren Mocha, MD       Anesthesiologist:                  Collins Scotland, MD    Echocardiographer:              Osborne Oman, MD   Pre-operative Echo Findings: Severe aortic stenosis Normal left ventricular systolic function   _____________     Echo 11/18/2020: IMPRESSIONS   1. The aortic valve has been replaced with a 23 mm Edwards Sapient Valve  Aortic valve mean gradient measures 18.5 mmHg. DVI is 0.37.   2. Left ventricular ejection fraction, by estimation, is 60 to 65%. The  left ventricle has normal function.   Comparison(s): A prior study was performed on 11/17/2020. Increase in  prosthetic gradients with no evidence of paravalvular leak.   _____________________  Echo 12/16/20 IMPRESSIONS  1. Left ventricular ejection fraction, by estimation, is 55 to 60%. Left ventricular ejection fraction by 3D volume is 57 %. The left ventricle has normal function. The left ventricle has no regional wall motion abnormalities. Left ventricular diastolic  parameters are consistent with Grade I diastolic dysfunction (impaired relaxation).  2. Right ventricular systolic function is normal. The right ventricular size is normal.  3. The mitral valve is normal in structure. No evidence of mitral valve regurgitation.  4. The aortic valve has been repaired/replaced. Aortic valve regurgitation is not visualized. No aortic stenosis is present.  5. Aortic dilatation noted. There is mild dilatation of the ascending aorta, measuring 40 mm.  EKG:  EKG is NOT  ordered today.   Recent Labs: 09/23/2020: TSH 2.670 11/13/2020: ALT 19 11/18/2020: BUN 16; Creatinine, Ser 0.86; Hemoglobin 14.8; Platelets 218; Potassium 3.9; Sodium  136  Recent Lipid Panel    Component Value Date/Time   CHOL 137 09/23/2020 0852   TRIG 112 09/23/2020 0852   HDL 41 09/23/2020 0852   CHOLHDL 3.3 09/23/2020 0852   CHOLHDL 3.2 11/18/2015 1111   VLDL 29 11/18/2015 1111   LDLCALC 76 09/23/2020 0852     Risk Assessment/Calculations:       Physical Exam:    VS:  BP 128/60   Pulse 67   Ht '5\' 1"'$   (1.549 m)   Wt 169 lb 3.2 oz (76.7 kg)   SpO2 97%   BMI 31.97 kg/m     Wt Readings from Last 3 Encounters:  12/16/20 169 lb 3.2 oz (76.7 kg)  12/15/20 170 lb 6.4 oz (77.3 kg)  12/02/20 169 lb (76.7 kg)     GEN: Well nourished, well developed in no acute distress HEENT: Normal NECK: No JVD LYMPHATICS: No lymphadenopathy CARDIAC: RRR, soft flow murmur. No rubs, gallops RESPIRATORY:  Clear to auscultation without rales, wheezing or rhonchi  ABDOMEN: Soft, non-tender, non-distended MUSCULOSKELETAL:  No edema; No deformity  SKIN: Warm and dry.   NEUROLOGIC:  Alert and oriented x 3 PSYCHIATRIC:  Normal affect   ASSESSMENT:    1. S/P TAVR (transcatheter aortic valve replacement)   2. Essential hypertension   3. Thyroid nodule     PLAN:    In order of problems listed above:  Severe AS s/p TAVR: echo today shows EF 55%, normally functioning TAVR with a mean gradient of 7.2 mm hg and no PVL. There is mild dilatation of the ascending aorta, measuring 40 mm. On plavix alone given need for Mobic. Will convert this to aspirin after 6 months (05/20/2021). She has NYHA class I symptoms. SBE prophylaxis discussed; the patient is edentulous and does not go to the dentist. I will see her back in 1 year for follow up and echo  HTN: Bp well controlled now on increase ARB. Will get BMET today. No changes made   Thyroid nodules: follow up US showed "solitary left-sided thyroid nodule meets imaging criteria to recommend percutaneous sampling. Solitary right-sided thyroid nodule does not meet criteria to recommend percutaneous sampling or continued dedicated follow-up." I have asked her to follow up with her PCP about this. Seeing Dr. Kasandra Knudsen Jonathon Bellows Toeh with ENT for biopsy and follow up.    Medication Adjustments/Labs and Tests Ordered: Current medicines are reviewed at length with the patient today.  Concerns regarding medicines are outlined above.  Orders Placed This Encounter  Procedures   Basic  Metabolic Panel (BMET)     No orders of the defined types were placed in this encounter.    Patient Instructions  Medication Instructions:  *If you need a refill on your cardiac medications before your next appointment, please call your pharmacy*  Lab Work: Your physician recommends that you have lab work today- BMET  If you have labs (blood work) drawn today and your tests are completely normal, you will receive your results only by: MyChart Message (if you have MyChart) OR A paper copy in the mail If you have any lab test that is abnormal or we need to change your treatment, we will call you to review the results.  Testing/Procedures: None ordered today.  Follow-Up: At [facility], you and your health needs are our priority.  As part of our continuing mission to provide you with exceptional heart care, we have created designated Provider Care Teams.  These Care Teams include your primary Cardiologist (physician)  and Advanced Practice Providers (APPs -  Physician Assistants and Nurse Practitioners) who all work together to provide you with the care you need, when you need it.  We recommend signing up for the patient portal called "MyChart".  Sign up information is provided on this After Visit Summary.  MyChart is used to connect with patients for Virtual Visits (Telemedicine).  Patients are able to view lab/test results, encounter notes, upcoming appointments, etc.  Non-urgent messages can be sent to your provider as well.   To learn more about what you can do with MyChart, go to [email or web address].    Your next appointment:   1 year(s)  The format for your next appointment:   In Person  Provider:   Nell Range, PA-C   Signed, Angelena Form, PA-C  12/16/2020 4:32 PM    [place]

## 2020-12-15 ENCOUNTER — Other Ambulatory Visit: Payer: Self-pay

## 2020-12-15 ENCOUNTER — Encounter: Payer: Self-pay | Admitting: Nurse Practitioner

## 2020-12-15 ENCOUNTER — Ambulatory Visit: Payer: Medicare Other | Admitting: Nurse Practitioner

## 2020-12-15 VITALS — BP 128/70 | HR 77 | Temp 98.1°F | Ht 65.0 in | Wt 170.4 lb

## 2020-12-15 DIAGNOSIS — Z952 Presence of prosthetic heart valve: Secondary | ICD-10-CM

## 2020-12-15 DIAGNOSIS — I10 Essential (primary) hypertension: Secondary | ICD-10-CM | POA: Diagnosis not present

## 2020-12-15 DIAGNOSIS — E041 Nontoxic single thyroid nodule: Secondary | ICD-10-CM

## 2020-12-15 NOTE — Progress Notes (Signed)
Established Patient Office Visit  Subjective:  Patient ID: Sierra Carpenter, female    DOB: 08-29-1945  Age: 75 y.o. MRN: [id number]  CC:  Chief Complaint  Patient presents with   Follow-up    HPI Sierra Carpenter presents for follow-up visit.  She was seen by cardiology in July, 2022.  She was undergoing evaluation for aortic valve replacement.  During the CT angio of her chest she was found to have multiple thyroid nodules, the largest being 2.3 cm in diameter of the left lobe of the thyroid.  She had her aortic valve replacement and a follow-up ultrasound was done this past Friday, 12/11/2020.  The thyroid nodule on left thyroid lobe now measuring about 1.6cm in diameter and meeting criteria for biopsy. Second nodule on right thyroid lobe is smaller but should be watched. Patient states she has done well since aortic valve replacement.  She has no concerns or complaints today.  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 CBurt Knackand Dr. BCyndia Carpenter  Severe aortic stenosis    s/p TAVR 11/17/20    Past Surgical History:  Procedure Laterality Date   ABDOMINAL HYSTERECTOMY     fibroids; ovaries intact.   APPENDECTOMY     BILATERAL CARPAL TUNNEL RELEASE Bilateral    CARDIAC CATHETERIZATION     moderate stenosis in 1st diagonal & RCA   CESAREAN SECTION     CHOLECYSTECTOMY     EYE SURGERY Bilateral    cataracts   INNER EAR SURGERY Right    LITHOTRIPSY Right    RIGHT/LEFT HEART CATH AND CORONARY ANGIOGRAPHY N/A 10/21/2020   Procedure: RIGHT/LEFT HEART CATH AND CORONARY ANGIOGRAPHY;  Surgeon: CSherren Mocha MD;  [facility];  Service: Cardiovascular;  Laterality: N/A;   TEE WITHOUT CARDIOVERSION N/A 11/17/2020   Procedure: TRANSESOPHAGEAL ECHOCARDIOGRAM (TEE);  Surgeon: CSherren Mocha MD;  [facility]  Service: Open Heart Surgery;  Laterality: N/A;   TRANSCATHETER AORTIC VALVE REPLACEMENT, TRANSFEMORAL N/A 11/17/2020   Procedure: TRANSCATHETER AORTIC VALVE REPLACEMENT, TRANSFEMORAL;  Surgeon: CSherren Mocha MD;  [facility]  Service: Open Heart Surgery;  Laterality: N/A;   TRANSTHORACIC ECHOCARDIOGRAM  07/12/2010   EF=>55% with normal systolic function; trace MR/TR/PR    Family History  Problem Relation Age of Onset   Heart disease Father 240      AMI   Heart disease Brother    Heart disease Son    Heart disease Paternal Grandfather    Alzheimer's disease Mother    Heart disease Brother        CHF/CAD   COPD Brother    Heart disease Brother    Breast cancer Neg Hx     Social History   Socioeconomic History   Marital status: Widowed    Spouse name: Not on file   Number of children: 1   Years of education: 141  Highest education level: Not on file  Occupational History   Occupation: retired    Comment: PProduction designer, theatre/television/film Tobacco Use   Smoking status: Never   Smokeless tobacco:  Never  Vaping Use   Vaping Use: Never used  Substance and Sexual Activity   Alcohol use: No   Drug use: No   Sexual activity: Not Currently    Birth control/protection: Post-menopausal, Surgical  Other Topics Concern   Not on file  Social History Narrative   Marital status: widowed since 2014 due to COPD; married x 37 years.      Children: 1 daughter; 1 son died of CHF age 67 pacemaker.  4 grandchildren.      Lives: alone      Employment: retired at age 37 from [employer].      Tobacco:  never       Alcohol:  never      Exercise: yes in 2019; walking 1-2 miles daily.  DDD lumbar limiting walking.      ADLs: independent with ADLs; drives.        Advanced Directives: none; has  paper.  FULL CODE; no prolonged measures.  HCPOA: daughter/Sierra [employer].   Social Determinants of Health   Financial Resource Strain: Not on file  Food Insecurity: Not on file  Transportation Needs: Not on file  Physical Activity: Not on file  Stress: Not on file  Social Connections: Not on file  Intimate Partner Violence: Not on file    Outpatient Medications Prior to Visit  Medication Sig Dispense Refill   Ascorbic Acid (VITAMIN Sierra) 1000 MG tablet Take 1,000 mg by mouth in the morning and at bedtime. Morning & at lunch     atorvastatin (LIPITOR) 80 MG tablet Take 1 tablet by mouth once daily 30 tablet 11   Carboxymeth-Glycerin-Polysorb (REFRESH OPTIVE ADVANCED) 0.5-1-0.5 % SOLN Place 1 drop into both eyes every 3 (three) hours as needed (dry eye).     Carboxymethylcellul-Glycerin (REFRESH OPTIVE) 1-0.9 % GEL Place 1 drop into both eyes at bedtime.     Cholecalciferol (VITAMIN D3) 25 MCG (1000 UT) CAPS Take 1,000 Units by mouth 2 (two) times daily. Morning & at bedtime     clopidogrel (PLAVIX) 75 MG tablet Take 1 tablet (75 mg total) by mouth daily with breakfast. 90 tablet 1   Coenzyme Q10 (COQ10) 100 MG CAPS Take 100 mg by mouth in the morning and at bedtime.     fish oil-omega-3 fatty acids 1000 MG capsule Take 1,000 mg by mouth 3 (three) times daily with meals.     meloxicam (MOBIC) 7.5 MG tablet Take 1 tablet by mouth once daily (Patient taking differently: Take 3.75 mg by mouth every other day.) 45 tablet 0   Multiple Vitamins-Minerals (PRESERVISION AREDS 2 PO) Take 1 tablet by mouth in the morning and at bedtime.     oxybutynin (DITROPAN) 5 MG tablet Take 1 tablet (5 mg total) by mouth 2 (two) times daily. 180 tablet 3   valsartan (DIOVAN) 160 MG tablet Take 1 tablet (160 mg total) by mouth daily. 90 tablet 3   No facility-administered medications prior to visit.    Allergies  Allergen Reactions   Hydrochlorothiazide     Hypercalcemia     ROS Review of Systems   Constitutional:  Negative for activity change, appetite change, chills, fatigue and fever.  HENT:  Negative for congestion, postnasal drip, rhinorrhea, sinus pressure, sinus pain, sneezing and sore throat.   Eyes: Negative.   Respiratory:  Positive for shortness of breath. Negative for cough, chest tightness and wheezing.   Cardiovascular:  Negative for chest pain and palpitations.  Gastrointestinal:  Negative for abdominal pain, constipation,  diarrhea, nausea and vomiting.  Endocrine: Negative for cold intolerance, heat intolerance, polydipsia and polyuria.  Genitourinary:  Negative for dyspareunia, dysuria, flank pain, frequency and urgency.  Musculoskeletal:  Negative for arthralgias, back pain and myalgias.  Skin:  Negative for rash.  Allergic/Immunologic: Negative for environmental allergies.  Neurological:  Negative for dizziness, weakness and headaches.  Hematological:  Negative for adenopathy.  Psychiatric/Behavioral:  The patient is not nervous/anxious.      Objective:    Physical Exam Vitals and nursing note reviewed.  Constitutional:      Appearance: Normal appearance. She is well-developed. She is obese.  HENT:     Head: Normocephalic and atraumatic.     Nose: Nose normal.     Mouth/Throat:     Mouth: Mucous membranes are moist.  Eyes:     Extraocular Movements: Extraocular movements intact.     Conjunctiva/sclera: Conjunctivae normal.     Pupils: Pupils are equal, round, and reactive to light.  Neck:     Vascular: No carotid bruit.     Comments: There is very mild thyromegaly on the left side of the neck. Cardiovascular:     Rate and Rhythm: Normal rate and regular rhythm.     Pulses: Normal pulses.     Heart sounds: Murmur heard.  Pulmonary:     Effort: Pulmonary effort is normal.     Breath sounds: Normal breath sounds.  Abdominal:     Palpations: Abdomen is soft.  Musculoskeletal:        General: Normal range of motion.     Cervical back: Normal range of  motion and neck supple.  Lymphadenopathy:     Cervical: No cervical adenopathy.  Skin:    General: Skin is warm and dry.     Capillary Refill: Capillary refill takes less than 2 seconds.  Neurological:     General: No focal deficit present.     Mental Status: She is alert and oriented to person, place, and time.  Psychiatric:        Mood and Affect: Mood normal.        Behavior: Behavior normal.        Thought Content: Thought content normal.        Judgment: Judgment normal.    Today's Vitals   12/15/20 0957  BP: 128/70  Pulse: 77  Temp: 98.1 F (36.7 Sierra)  SpO2: 97%  Weight: 170 lb 6.4 oz (77.3 kg)  Height: _0  (1.651 m)   Body mass index is 28.36 kg/m.   Wt Readings from Last 3 Encounters:  12/15/20 170 lb 6.4 oz (77.3 kg)  12/02/20 169 lb (76.7 kg)  11/18/20 168 lb 6.4 oz (76.4 kg)     Health Maintenance Due  Topic Date Due   TETANUS/TDAP  08/06/2020   INFLUENZA VACCINE  11/02/2020    There are no preventive care reminders to display for this patient.  Lab Results  Component Value Date   TSH 2.670 09/23/2020   Lab Results  Component Value Date   WBC 12.1 (H) 11/18/2020   HGB 14.8 11/18/2020   HCT 42.3 11/18/2020   MCV 90.4 11/18/2020   PLT 218 11/18/2020   Lab Results  Component Value Date   NA 136 11/18/2020   K 3.9 11/18/2020   CHLORIDE 104 06/01/2015   CO2 24 11/18/2020   GLUCOSE 110 (H) 11/18/2020   BUN 16 11/18/2020   CREATININE 0.86 11/18/2020   BILITOT 0.8 11/13/2020   ALKPHOS 101 11/13/2020  AST 20 11/13/2020   ALT 19 11/13/2020   PROT 7.0 11/13/2020   ALBUMIN 4.2 11/13/2020   CALCIUM 9.0 11/18/2020   ANIONGAP 8 11/18/2020   EGFR 56 (L) 09/23/2020   GFR 51.79 (L) 05/15/2018   Lab Results  Component Value Date   CHOL 137 09/23/2020   Lab Results  Component Value Date   HDL 41 09/23/2020   Lab Results  Component Value Date   LDLCALC 76 09/23/2020   Lab Results  Component Value Date   TRIG 112 09/23/2020   Lab  Results  Component Value Date   CHOLHDL 3.3 09/23/2020   Lab Results  Component Value Date   HGBA1C 5.5 09/23/2020      Assessment & Plan:  1. Left thyroid nodule Reviewed ultrasound of the thyroid gland with patient.  There is a solitary nodule on the left thyroid gland measuring approximately 1.6 cm in diameter.  It does meet criteria for biopsy.  Will refer to ENT for further evaluation. - Ambulatory referral to ENT  2. S/P TAVR (transcatheter aortic valve replacement) Patient is postop from aortic valve replacement.  Doing well.  She is scheduled to have repeat echocardiogram and follow-up with cardiologist in the near future.  3. Essential hypertension Well-managed.  Continue blood pressure medications as prescribed.  Cardiology follow-ups as scheduled.  Problem List Items Addressed This Visit       Cardiovascular and Mediastinum   Essential hypertension     Other   S/P TAVR (transcatheter aortic valve replacement)   Other Visit Diagnoses     Left thyroid nodule    -  Primary   Relevant Orders   Ambulatory referral to ENT      This note was dictated using Dragon Voice Recognition Software. Rapid proofreading was performed to expedite the delivery of the information. Despite proofreading, phonetic errors will occur which are common with this voice recognition software. Please take this into consideration. If there are any concerns, please contact our office.     Follow-up: Return for as scheduled.    Ronnell Freshwater, NP

## 2020-12-16 ENCOUNTER — Ambulatory Visit (HOSPITAL_COMMUNITY): Payer: Medicare Other | Attending: Cardiovascular Disease

## 2020-12-16 ENCOUNTER — Encounter: Payer: Self-pay | Admitting: Nurse Practitioner

## 2020-12-16 ENCOUNTER — Other Ambulatory Visit: Payer: Self-pay

## 2020-12-16 ENCOUNTER — Encounter: Payer: Self-pay | Admitting: Physician Assistant

## 2020-12-16 ENCOUNTER — Ambulatory Visit: Payer: Medicare Other | Admitting: Physician Assistant

## 2020-12-16 VITALS — BP 128/60 | HR 67 | Ht 61.0 in | Wt 169.2 lb

## 2020-12-16 DIAGNOSIS — Z952 Presence of prosthetic heart valve: Secondary | ICD-10-CM

## 2020-12-16 DIAGNOSIS — I10 Essential (primary) hypertension: Secondary | ICD-10-CM

## 2020-12-16 DIAGNOSIS — E041 Nontoxic single thyroid nodule: Secondary | ICD-10-CM

## 2020-12-16 LAB — ECHOCARDIOGRAM COMPLETE
AR max vel: 1.59 cm2
AV Area VTI: 1.81 cm2
AV Area mean vel: 1.67 cm2
AV Mean grad: 7.3 mmHg
AV Peak grad: 13.9 mmHg
Ao pk vel: 1.87 m/s
Area-P 1/2: 3.28 cm2
S' Lateral: 1.3 cm

## 2020-12-16 NOTE — Patient Instructions (Signed)
Medication Instructions:  *If you need a refill on your cardiac medications before your next appointment, please call your pharmacy*  Lab Work: Your physician recommends that you have lab work today- BMET  If you have labs (blood work) drawn today and your tests are completely normal, you will receive your results only by: MyChart Message (if you have MyChart) OR A paper copy in the mail If you have any lab test that is abnormal or we need to change your treatment, we will call you to review the results.  Testing/Procedures: None ordered today.  Follow-Up: At [facility], you and your health needs are our priority.  As part of our continuing mission to provide you with exceptional heart care, we have created designated Provider Care Teams.  These Care Teams include your primary Cardiologist (physician) and Advanced Practice Providers (APPs -  Physician Assistants and Nurse Practitioners) who all work together to provide you with the care you need, when you need it.  We recommend signing up for the patient portal called "MyChart".  Sign up information is provided on this After Visit Summary.  MyChart is used to connect with patients for Virtual Visits (Telemedicine).  Patients are able to view lab/test results, encounter notes, upcoming appointments, etc.  Non-urgent messages can be sent to your provider as well.   To learn more about what you can do with MyChart, go to [email or web address].    Your next appointment:   1 year(s)  The format for your next appointment:   In Person  Provider:   Nell Range, PA-C

## 2020-12-17 ENCOUNTER — Other Ambulatory Visit: Payer: Self-pay

## 2020-12-17 ENCOUNTER — Encounter: Payer: Self-pay | Admitting: Nurse Practitioner

## 2020-12-17 DIAGNOSIS — Z1211 Encounter for screening for malignant neoplasm of colon: Secondary | ICD-10-CM

## 2020-12-17 LAB — BASIC METABOLIC PANEL
BUN/Creatinine Ratio: 17 (ref 12–28)
BUN: 17 mg/dL (ref 8–27)
CO2: 23 mmol/L (ref 20–29)
Calcium: 9.7 mg/dL (ref 8.7–10.3)
Chloride: 104 mmol/L (ref 96–106)
Creatinine, Ser: 1.02 mg/dL — ABNORMAL HIGH (ref 0.57–1.00)
Glucose: 89 mg/dL (ref 65–99)
Potassium: 4.1 mmol/L (ref 3.5–5.2)
Sodium: 143 mmol/L (ref 134–144)
eGFR: 58 mL/min/{1.73_m2} — ABNORMAL LOW (ref >59)

## 2020-12-28 LAB — COLOGUARD: Cologuard: NEGATIVE

## 2021-01-18 NOTE — Progress Notes (Signed)
Negative cologuard. Repeat in 3 years

## 2021-02-01 ENCOUNTER — Other Ambulatory Visit: Payer: Self-pay | Admitting: Otolaryngology

## 2021-02-02 ENCOUNTER — Other Ambulatory Visit: Payer: Self-pay | Admitting: Otolaryngology

## 2021-02-02 DIAGNOSIS — E041 Nontoxic single thyroid nodule: Secondary | ICD-10-CM

## 2021-03-03 ENCOUNTER — Ambulatory Visit
Admission: RE | Admit: 2021-03-03 | Discharge: 2021-03-03 | Disposition: A | Discharge status: Home / Self Care | Payer: Medicare Other | Source: Ambulatory Visit | Attending: Otolaryngology | Admitting: Otolaryngology

## 2021-03-03 ENCOUNTER — Other Ambulatory Visit (HOSPITAL_COMMUNITY)
Admission: RE | Admit: 2021-03-03 | Discharge: 2021-03-03 | Disposition: A | Discharge status: Home / Self Care | Payer: Medicare Other | Source: Ambulatory Visit | Attending: Physician Assistant | Admitting: Physician Assistant

## 2021-03-03 DIAGNOSIS — E041 Nontoxic single thyroid nodule: Secondary | ICD-10-CM

## 2021-03-04 LAB — CYTOLOGY - NON PAP

## 2021-03-09 ENCOUNTER — Other Ambulatory Visit: Payer: Self-pay

## 2021-03-09 ENCOUNTER — Other Ambulatory Visit: Payer: Self-pay | Admitting: Otolaryngology

## 2021-03-09 DIAGNOSIS — E041 Nontoxic single thyroid nodule: Secondary | ICD-10-CM

## 2021-03-12 ENCOUNTER — Ambulatory Visit: Payer: Medicare Other | Admitting: Cardiovascular Disease

## 2021-03-19 ENCOUNTER — Other Ambulatory Visit: Payer: Self-pay

## 2021-03-19 DIAGNOSIS — Z1329 Encounter for screening for other suspected endocrine disorder: Secondary | ICD-10-CM

## 2021-03-19 DIAGNOSIS — Z130 Encounter for screening for diseases of the blood and blood-forming organs and certain disorders involving the immune mechanism: Secondary | ICD-10-CM

## 2021-03-19 DIAGNOSIS — Z0000 Encounter for general adult medical examination without abnormal findings: Secondary | ICD-10-CM

## 2021-03-23 ENCOUNTER — Other Ambulatory Visit: Payer: Self-pay

## 2021-03-23 ENCOUNTER — Other Ambulatory Visit: Payer: Medicare Other

## 2021-03-23 DIAGNOSIS — Z1321 Encounter for screening for nutritional disorder: Secondary | ICD-10-CM

## 2021-03-23 DIAGNOSIS — Z0000 Encounter for general adult medical examination without abnormal findings: Secondary | ICD-10-CM

## 2021-03-24 LAB — CBC WITH DIFFERENTIAL/PLATELET
Basophils Absolute: 0.1 10*3/uL (ref 0.0–0.2)
Basos: 1 %
EOS (ABSOLUTE): 0.5 10*3/uL — ABNORMAL HIGH (ref 0.0–0.4)
Eos: 6 %
Hematocrit: 48.7 % — ABNORMAL HIGH (ref 34.0–46.6)
Hemoglobin: 16.8 g/dL — ABNORMAL HIGH (ref 11.1–15.9)
Immature Grans (Abs): 0 10*3/uL (ref 0.0–0.1)
Immature Granulocytes: 0 %
Lymphocytes Absolute: 1.8 10*3/uL (ref 0.7–3.1)
Lymphs: 19 %
MCH: 31.1 pg (ref 26.6–33.0)
MCHC: 34.5 g/dL (ref 31.5–35.7)
MCV: 90 fL (ref 79–97)
Monocytes Absolute: 0.6 10*3/uL (ref 0.1–0.9)
Monocytes: 7 %
Neutrophils Absolute: 6.5 10*3/uL (ref 1.4–7.0)
Neutrophils: 67 %
Platelets: 298 10*3/uL (ref 150–450)
RBC: 5.41 x10E6/uL — ABNORMAL HIGH (ref 3.77–5.28)
RDW: 13.1 % (ref 11.7–15.4)
WBC: 9.5 10*3/uL (ref 3.4–10.8)

## 2021-03-24 LAB — BASIC METABOLIC PANEL
BUN/Creatinine Ratio: 20 (ref 12–28)
BUN: 20 mg/dL (ref 8–27)
CO2: 22 mmol/L (ref 20–29)
Calcium: 10.1 mg/dL (ref 8.7–10.3)
Chloride: 102 mmol/L (ref 96–106)
Creatinine, Ser: 1.02 mg/dL — ABNORMAL HIGH (ref 0.57–1.00)
Glucose: 107 mg/dL — ABNORMAL HIGH (ref 70–99)
Potassium: 4.5 mmol/L (ref 3.5–5.2)
Sodium: 141 mmol/L (ref 134–144)
eGFR: 58 mL/min/{1.73_m2} — ABNORMAL LOW (ref >59)

## 2021-03-24 NOTE — Progress Notes (Signed)
Cmp stable. Mild elevation of Hgb and Hct. Will discuss at next visit 04/02/2021.

## 2021-03-30 ENCOUNTER — Ambulatory Visit: Payer: Medicare Other | Admitting: Nurse Practitioner

## 2021-03-30 ENCOUNTER — Other Ambulatory Visit: Payer: Self-pay

## 2021-03-30 ENCOUNTER — Encounter: Payer: Self-pay | Admitting: Nurse Practitioner

## 2021-03-30 VITALS — BP 121/66 | HR 70 | Temp 97.5°F | Ht 61.0 in | Wt 172.4 lb

## 2021-03-30 DIAGNOSIS — E041 Nontoxic single thyroid nodule: Secondary | ICD-10-CM

## 2021-03-30 DIAGNOSIS — I2510 Atherosclerotic heart disease of native coronary artery without angina pectoris: Secondary | ICD-10-CM | POA: Diagnosis not present

## 2021-03-30 DIAGNOSIS — N3941 Urge incontinence: Secondary | ICD-10-CM

## 2021-03-30 DIAGNOSIS — I10 Essential (primary) hypertension: Secondary | ICD-10-CM

## 2021-03-30 MED ORDER — OXYBUTYNIN CHLORIDE ER 10 MG PO TB24: 10.0000 mg | ORAL_TABLET | Freq: Every day | ORAL | 3 refills | Status: DC

## 2021-03-30 NOTE — Progress Notes (Signed)
Established Patient Office Visit  Subjective:  Patient ID: Sierra Carpenter, female    DOB: 06/11/1945  Age: 75 y.o. MRN: [id number]  CC:  Chief Complaint  Patient presents with   Follow-up    HPI Sierra Carpenter presents for follow up visit. She has had biopsy of the thyroid since her last visit. She states that her thyroid biopsy was inconclusive. Will hae a repeat ultrasound In [place]. She did have repeat labs. Serum creatinine was mildly elevated at 1.02 and eGFR was slightly low at 58. These labs are stable from previous checks.  She states that she does have nocturnal urinary incontinence. This is generally when she first gets up from bed. She states that there are sometimes when she just doesn't make it to the bathroom in time. Wears a Depends when she feels like this may be a problem. She currently takes ditropan 59m twice daily.  The patient has not other concerns or complaints today.   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 CoBurt Knacknd Dr. BaCyndia Bent Severe aortic stenosis    s/p TAVR 11/17/20    Past Surgical History:  Procedure Laterality Date   ABDOMINAL HYSTERECTOMY     fibroids; ovaries intact.   APPENDECTOMY     BILATERAL CARPAL TUNNEL RELEASE Bilateral    CARDIAC CATHETERIZATION     moderate stenosis in 1st diagonal & RCA   CESAREAN SECTION     CHOLECYSTECTOMY     EYE SURGERY Bilateral    cataracts   INNER EAR SURGERY Right    LITHOTRIPSY Right    RIGHT/LEFT HEART CATH AND CORONARY ANGIOGRAPHY N/A 10/21/2020   Procedure: RIGHT/LEFT HEART CATH AND CORONARY ANGIOGRAPHY;  Surgeon: CoSherren MochaMD;  [facility];  Service: Cardiovascular;  Laterality: N/A;   TEE WITHOUT CARDIOVERSION N/A 11/17/2020   Procedure: TRANSESOPHAGEAL ECHOCARDIOGRAM (TEE);  Surgeon: CoSherren MochaMD;  [facility] Service: Open Heart Surgery;  Laterality: N/A;   TRANSCATHETER AORTIC VALVE REPLACEMENT, TRANSFEMORAL N/A 11/17/2020   Procedure: TRANSCATHETER AORTIC VALVE REPLACEMENT, TRANSFEMORAL;  Surgeon: CoSherren MochaMD;  [facility] Service: Open [facility] Surgery;  Laterality: N/A;   TRANSTHORACIC ECHOCARDIOGRAM  07/12/2010   EF=>55% with normal systolic function; trace MR/TR/PR    Family History  Problem Relation Age of Onset   Heart disease Father 2965     AMI   Heart disease Brother    Heart disease Son    Heart disease Paternal Grandfather    Alzheimer's disease Mother    Heart disease Brother        CHF/CAD   COPD Brother    Heart disease Brother    Breast cancer Neg Hx     Social History   Socioeconomic History   Marital status: Widowed    Spouse name: Not on file   Number of children: 1   Years of education: 1249 Highest education level: Not on file  Occupational History   Occupation: retired    Comment: PrProduction designer, theatre/television/filmTobacco Use   Smoking  status: Never   Smokeless tobacco: Never  Vaping Use   Vaping Use: Never used  Substance and Sexual Activity   Alcohol use: No   Drug use: No   Sexual activity: Not Currently    Birth control/protection: Post-menopausal, Surgical  Other Topics Concern   Not on file  Social History Narrative   Marital status: widowed since 2014 due to COPD; married x 37 years.      Children: 1 daughter; 1 son died of CHF age 1 pacemaker.  4 grandchildren.      Lives: alone      Employment: retired at age 73 from [employer].      Tobacco:  never       Alcohol:  never      Exercise: yes in 2019; walking 1-2 miles daily.  DDD lumbar limiting walking.      ADLs: independent with ADLs; drives.         Advanced Directives: none; has paper.  FULL CODE; no prolonged measures.  HCPOA: daughter/Misty Wenn-Maness.   Social Determinants of Health   Financial Resource Strain: Not on file  Food Insecurity: Not on file  Transportation Needs: Not on file  Physical Activity: Not on file  Stress: Not on file  Social Connections: Not on file  Intimate Partner Violence: Not on file    Outpatient Medications Prior to Visit  Medication Sig Dispense Refill   Ascorbic Acid (VITAMIN C) 1000 MG tablet Take 1,000 mg by mouth in the morning and at bedtime. Morning & at lunch     atorvastatin (LIPITOR) 80 MG tablet Take 1 tablet by mouth once daily 30 tablet 11   Carboxymeth-Glycerin-Polysorb (REFRESH OPTIVE ADVANCED) 0.5-1-0.5 % SOLN Place 1 drop into both eyes every 3 (three) hours as needed (dry eye).     Carboxymethylcellul-Glycerin (REFRESH OPTIVE) 1-0.9 % GEL Place 1 drop into both eyes at bedtime.     Cholecalciferol (VITAMIN D3) 25 MCG (1000 UT) CAPS Take 1,000 Units by mouth 2 (two) times daily. Morning & at bedtime     clopidogrel (PLAVIX) 75 MG tablet Take 1 tablet (75 mg total) by mouth daily with breakfast. 90 tablet 1   Coenzyme Q10 (COQ10) 100 MG CAPS Take 100 mg by mouth in the morning and at bedtime.     fish oil-omega-3 fatty acids 1000 MG capsule Take 1,000 mg by mouth 3 (three) times daily with meals.     meloxicam (MOBIC) 7.5 MG tablet Take 1 tablet by mouth once daily (Patient taking differently: Take 3.75 mg by mouth every other day.) 45 tablet 0   Multiple Vitamins-Minerals (PRESERVISION AREDS 2 PO) Take 1 tablet by mouth in the morning and at bedtime.     valsartan (DIOVAN) 160 MG tablet Take 1 tablet (160 mg total) by mouth daily. 90 tablet 3   oxybutynin (DITROPAN) 5 MG tablet Take 1 tablet (5 mg total) by mouth 2 (two) times daily. 180 tablet 3   No facility-administered medications prior to visit.    Allergies  Allergen Reactions   Hydrochlorothiazide     Hypercalcemia      ROS Review of Systems  Constitutional:  Negative for activity change, appetite change, chills, fatigue and fever.  HENT:  Negative for congestion, postnasal drip, rhinorrhea, sinus pressure, sinus pain, sneezing and sore throat.   Eyes: Negative.   Respiratory:  Negative for cough, chest tightness, shortness of breath and wheezing.   Cardiovascular:  Negative for chest pain and palpitations.  Gastrointestinal:  Negative  for abdominal pain, constipation, diarrhea, nausea and vomiting.  Endocrine: Negative for cold intolerance, heat intolerance, polydipsia and polyuria.       Recent thyroid biopsy was inconclusive. Will have another ultrasound In Jaimee 2023 for surveillance.   Genitourinary:  Positive for frequency and urgency. Negative for dyspareunia, dysuria and flank pain.       Urinary incontinence, especially at night. ,   Musculoskeletal:  Negative for arthralgias, back pain and myalgias.  Skin:  Negative for rash.  Allergic/Immunologic: Negative for environmental allergies.  Neurological:  Negative for dizziness, weakness and headaches.  Hematological:  Negative for adenopathy.  Psychiatric/Behavioral:  The patient is not nervous/anxious.      Objective:    Physical Exam Vitals and nursing note reviewed.  Constitutional:      Appearance: Normal appearance. She is well-developed. She is obese.  HENT:     Head: Normocephalic and atraumatic.     Nose: Nose normal.     Mouth/Throat:     Mouth: Mucous membranes are moist.  Eyes:     Extraocular Movements: Extraocular movements intact.     Conjunctiva/sclera: Conjunctivae normal.     Pupils: Pupils are equal, round, and reactive to light.  Cardiovascular:     Rate and Rhythm: Normal rate and regular rhythm.     Pulses: Normal pulses.     Heart sounds: Murmur heard.  Pulmonary:     Effort: Pulmonary effort is normal.     Breath sounds: Normal breath sounds.  Abdominal:     Palpations: Abdomen is soft.  Musculoskeletal:         General: Normal range of motion.     Cervical back: Normal range of motion and neck supple.  Lymphadenopathy:     Cervical: No cervical adenopathy.  Skin:    General: Skin is warm and dry.     Capillary Refill: Capillary refill takes less than 2 seconds.  Neurological:     General: No focal deficit present.     Mental Status: She is alert and oriented to person, place, and time.  Psychiatric:        Mood and Affect: Mood normal.        Behavior: Behavior normal.        Thought Content: Thought content normal.        Judgment: Judgment normal.    Today's Vitals   03/30/21 0948  BP: 121/66  Pulse: 70  Temp: (!) 97.5 F (36.4 C)  SpO2: 97%  Weight: 172 lb 6.4 oz (78.2 kg)  Height: _0  (1.549 m)   Body mass index is 32.57 kg/m.   Wt Readings from Last 3 Encounters:  03/30/21 172 lb 6.4 oz (78.2 kg)  12/16/20 169 lb 3.2 oz (76.7 kg)  12/15/20 170 lb 6.4 oz (77.3 kg)     Health Maintenance Due  Topic Date Due   Zoster Vaccines- Shingrix (2 of 2) 10/22/2016   TETANUS/TDAP  08/06/2020    There are no preventive care reminders to display for this patient.  Lab Results  Component Value Date   TSH 2.670 09/23/2020   Lab Results  Component Value Date   WBC 9.5 03/23/2021   HGB 16.8 (H) 03/23/2021   HCT 48.7 (H) 03/23/2021   MCV 90 03/23/2021   PLT 298 03/23/2021   Lab Results  Component Value Date   NA 141 03/23/2021   K 4.5 03/23/2021   CHLORIDE 104 06/01/2015   CO2 22 03/23/2021   GLUCOSE 107 (H) 03/23/2021  BUN 20 03/23/2021   CREATININE 1.02 (H) 03/23/2021   BILITOT 0.8 11/13/2020   ALKPHOS 101 11/13/2020   AST 20 11/13/2020   ALT 19 11/13/2020   PROT 7.0 11/13/2020   ALBUMIN 4.2 11/13/2020   CALCIUM 10.1 03/23/2021   ANIONGAP 8 11/18/2020   EGFR 58 (L) 03/23/2021   GFR 51.79 (L) 05/15/2018   Lab Results  Component Value Date   CHOL 137 09/23/2020   Lab Results  Component Value Date   HDL 41 09/23/2020   Lab Results  Component  Value Date   LDLCALC 76 09/23/2020   Lab Results  Component Value Date   TRIG 112 09/23/2020   Lab Results  Component Value Date   CHOLHDL 3.3 09/23/2020   Lab Results  Component Value Date   HGBA1C 5.5 09/23/2020      Assessment & Plan:  1. Essential hypertension Blood pressure stable.  Continue blood pressure medication as prescribed.  2. Atherosclerosis of native coronary artery of native heart without angina pectoris Patient to continue regular visits with cardiologist.  3. Urge incontinence Changed oxybutynin to oxybutynin XL 10 mg daily.  Reassess at next visit. - oxybutynin (DITROPAN-XL) 10 MG 24 hr tablet; Take 1 tablet (10 mg total) by mouth at bedtime.  Dispense: 30 tablet; Refill: 3  4. Left thyroid nodule Patient now seeing ENT.  Recent thyroid biopsy was inconclusive.  Patient scheduled for follow-up ultrasound of thyroid in [place], 2023.  Problem List Items Addressed This Visit       Cardiovascular and Mediastinum   Essential hypertension - Primary   Coronary atherosclerosis     Endocrine   Left thyroid nodule     Other   Urge incontinence   Relevant Medications   oxybutynin (DITROPAN-XL) 10 MG 24 hr tablet    Meds ordered this encounter  Medications   oxybutynin (DITROPAN-XL) 10 MG 24 hr tablet    Sig: Take 1 tablet (10 mg total) by mouth at bedtime.    Dispense:  30 tablet    Refill:  3    Order Specific Question:   Supervising Provider    Answer:   Beatrice Lecher D [[id number]]    Follow-up: Return in about 6 months (around 09/28/2021) for medicare wellness, FBW a week prior to visit.    Ronnell Freshwater, NP

## 2021-03-31 ENCOUNTER — Encounter: Payer: Self-pay | Admitting: Nurse Practitioner

## 2021-05-23 ENCOUNTER — Other Ambulatory Visit: Payer: Self-pay | Admitting: Cardiovascular Disease

## 2021-07-09 ENCOUNTER — Ambulatory Visit: Payer: Medicare Other | Admitting: Cardiovascular Disease

## 2021-07-09 ENCOUNTER — Encounter: Payer: Self-pay | Admitting: Cardiovascular Disease

## 2021-07-09 VITALS — BP 125/65 | HR 68 | Ht 61.0 in | Wt 176.2 lb

## 2021-07-09 DIAGNOSIS — I10 Essential (primary) hypertension: Secondary | ICD-10-CM | POA: Diagnosis not present

## 2021-07-09 DIAGNOSIS — Z952 Presence of prosthetic heart valve: Secondary | ICD-10-CM | POA: Diagnosis not present

## 2021-07-09 DIAGNOSIS — E785 Hyperlipidemia, unspecified: Secondary | ICD-10-CM

## 2021-07-09 NOTE — Patient Instructions (Signed)

## 2021-07-09 NOTE — Progress Notes (Signed)
?Cardiology Office Note:   ? ?Date:  07/09/2021  ? ?ID:  Sierra Carpenter, DOB 05-29-1945, MRN [id number] ? ?PCP:  Ronnell Freshwater, NP ?  ?[facility] Providers ?Cardiologist:  Sanda Klein, MD    ? ?Referring MD: Ronnell Freshwater, NP  ? ?Chief Complaint  ?Patient presents with  ? Cardiac Valve Problem  ? ? ?History of Present Illness:   ? ?Sierra Carpenter is a 76 y.o. 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  She returns for routine follow-up. ? ?She is feeling well.  She loves working in her yard.  She has not had problems with exertional angina, dyspnea, dizziness or syncope.  She denies palpitations.  She does not have orthopnea, PND or lower extremity edema.  Only complaint are some joint aches and pains.  Typical blood pressure at home is around 110-120/60-70 and she keeps a detailed log of it.  Her initial blood pressure was elevated when she checked in today, when rechecked it was down to 125/65. ? ?She is scheduled have another ultrasound of her thyroid for a solitary right-sided nodule, previous FNA was nondiagnostic. ? ?Past Medical History:  ?Diagnosis Date  ? Coronary atherosclerosis   ? without significant obstruction  ? History of kidney stones   ? History of nuclear stress test 07/12/2010  ? bruce protocol myoview; normal pattern of perfusion, low risk, post-stress EF 92%  ? Hyperlipidemia   ? Hypertension   ? S/P TAVR (transcatheter aortic valve replacement) 11/17/2020  ? s/p TAVR with a 64m Edwards S3U via the TF approach by Dr. CBurt Knackand Dr. BCyndia Bent ? Severe aortic stenosis   ? s/p TAVR 11/17/20  ? ? ?Past Surgical History:  ?Procedure Laterality Date  ? ABDOMINAL HYSTERECTOMY    ? fibroids; ovaries intact.  ? APPENDECTOMY    ? BILATERAL CARPAL TUNNEL RELEASE Bilateral   ? CARDIAC CATHETERIZATION    ? moderate stenosis in  1st diagonal & RCA  ? CESAREAN SECTION    ? CHOLECYSTECTOMY    ? EYE SURGERY Bilateral   ? cataracts  ? INNER EAR SURGERY Right   ? LITHOTRIPSY Right   ? RIGHT/LEFT HEART CATH AND CORONARY ANGIOGRAPHY N/A 10/21/2020  ? Procedure: RIGHT/LEFT HEART CATH AND CORONARY ANGIOGRAPHY;  Surgeon: CSherren Mocha MD;  [facility];  Service: Cardiovascular;  Laterality: N/A;  ? TEE WITHOUT CARDIOVERSION N/A 11/17/2020  ? Procedure: TRANSESOPHAGEAL ECHOCARDIOGRAM (TEE);  Surgeon: CSherren Mocha MD;  [facility]  Service: Open Heart Surgery;  Laterality: N/A;  ? TRANSCATHETER AORTIC VALVE REPLACEMENT, TRANSFEMORAL N/A 11/17/2020  ? Procedure: TRANSCATHETER AORTIC VALVE REPLACEMENT, TRANSFEMORAL;  Surgeon: CSherren Mocha MD;  [facility]  Service: Open Heart Surgery;  Laterality: N/A;  ? TRANSTHORACIC ECHOCARDIOGRAM  07/12/2010  ? EF=>55% with normal systolic function; trace MR/TR/PR  ? ? ?Current Medications: ?Current Meds  ?Medication Sig  ? Ascorbic Acid (VITAMIN C) 1000 MG tablet Take 1,000 mg by mouth in the morning and at bedtime. Morning & at lunch  ? aspirin EC 81 MG tablet Take 81 mg by mouth daily. Swallow whole.  ? atorvastatin (LIPITOR) 80 MG tablet Take 1 tablet by mouth once daily  ? Carboxymeth-Glycerin-Polysorb (REFRESH OPTIVE ADVANCED) 0.5-1-0.5 % SOLN Place 1 drop into both eyes every 3 (three) hours as needed (dry eye).  ? Carboxymethylcellul-Glycerin (REFRESH OPTIVE) 1-0.9 % GEL  Place 1 drop into both eyes at bedtime.  ? Cholecalciferol (VITAMIN D3) 25 MCG (1000 UT) CAPS Take 1,000 Units by mouth 2 (two) times daily. Morning & at bedtime  ? Coenzyme Q10 (COQ10) 100 MG CAPS Take 100 mg by mouth in the morning and at bedtime.  ? fish oil-omega-3 fatty acids 1000 MG capsule Take 1,000 mg by mouth 3 (three) times daily with meals.  ? meloxicam (MOBIC) 7.5 MG tablet Take 1 tablet by mouth once daily (Patient taking differently: Take 3.75 mg by mouth every other day.)  ? Multiple  Vitamins-Minerals (PRESERVISION AREDS 2 PO) Take 1 tablet by mouth in the morning and at bedtime.  ? oxybutynin (DITROPAN-XL) 10 MG 24 hr tablet Take 1 tablet (10 mg total) by mouth at bedtime.  ? valsartan (DIOVAN) 160 MG tablet Take 1 tablet (160 mg total) by mouth daily.  ?  ? ?Allergies:   Hydrochlorothiazide  ? ?Social History  ? ?Socioeconomic History  ? Marital status: Widowed  ?  Spouse name: Not on file  ? Number of children: 1  ? Years of education: 83  ? Highest education level: Not on file  ?Occupational History  ? Occupation: retired  ?  Comment: [employer]  ?Tobacco Use  ? Smoking status: Never  ? Smokeless tobacco: Never  ?Vaping Use  ? Vaping Use: Never used  ?Substance and Sexual Activity  ? Alcohol use: No  ? Drug use: No  ? Sexual activity: Not Currently  ?  Birth control/protection: Post-menopausal, Surgical  ?Other Topics Concern  ? Not on file  ?Social History Narrative  ? Marital status: widowed since 2014 due to COPD; married x 37 years.  ?    Children: 1 daughter; 1 son died of CHF age 68 pacemaker.  4 grandchildren.  ?    Lives: alone  ?    Employment: retired at age 34 from [employer].  ?    Tobacco:  never   ?    Alcohol:  never  ?    Exercise: yes in 2019; walking 1-2 miles daily.  DDD lumbar limiting walking.  ?    ADLs: independent with ADLs; drives.    ?    Advanced Directives: none; has paper.  FULL CODE; no prolonged measures.  HCPOA: daughter/Misty Wenn-Maness.  ? ?Social Determinants of Health  ? ?Financial Resource Strain: Not on file  ?Food Insecurity: Not on file  ?Transportation Needs: Not on file  ?Physical Activity: Not on file  ?Stress: Not on file  ?Social Connections: Not on file  ?  ? ?Family History: ?The patient's family history includes Alzheimer's disease in her mother; COPD in her brother; Heart disease in her brother, brother, brother, paternal grandfather, and son; Heart disease (age of onset: 46) in her father. There is no history of Breast  cancer. ? ?ROS:   ?Please see the history of present illness.    ? All other systems reviewed and are negative. ? ?EKGs/Labs/Other Studies Reviewed:   ? ?The following studies were reviewed today: ?Echo 12/16/20 ?IMPRESSIONS ? 1. Left ventricular ejection fraction, by estimation, is 55 to 60%. Left ventricular ejection fraction by 3D volume is 57 %. The left ventricle has normal function. The left ventricle has no regional wall motion abnormalities. Left ventricular diastolic ? parameters are consistent with Grade I diastolic dysfunction (impaired relaxation). ? 2. Right ventricular systolic function is normal. The right ventricular size is normal. ? 3. The mitral valve is normal in structure. No  evidence of mitral valve regurgitation. ? 4. The aortic valve has been repaired/replaced. Aortic valve regurgitation is not visualized. No aortic stenosis is present. ? 5. Aortic dilatation noted. There is mild dilatation of the ascending aorta, measuring 40 mm. ?  ? ?EKG:  EKG is ordered today.  The ekg ordered today demonstrates normal sinus rhythm, left anterior fascicular block, otherwise normal tracing.  QTc 423 ms. ? ?Recent Labs: ?09/23/2020: TSH 2.670 ?11/13/2020: ALT 19 ?03/23/2021: BUN 20; Creatinine, Ser 1.02; Hemoglobin 16.8; Platelets 298; Potassium 4.5; Sodium 141  ?Recent Lipid Panel ?   ?Component Value Date/Time  ? CHOL 137 09/23/2020 0852  ? TRIG 112 09/23/2020 0852  ? HDL 41 09/23/2020 0852  ? CHOLHDL 3.3 09/23/2020 0852  ? CHOLHDL 3.2 11/18/2015 1111  ? VLDL 29 11/18/2015 1111  ? [place] 76 09/23/2020 0852  ? ? ? ?Risk Assessment/Calculations:   ?  ? ?    ? ?Physical Exam:   ? ?VS:  BP 125/65   Pulse 68   Ht '5\' 1"'$  (1.549 m)   Wt 176 lb 3.2 oz (79.9 kg)   SpO2 98%   BMI 33.29 kg/m?    ? ?Wt Readings from Last 3 Encounters:  ?07/09/21 176 lb 3.2 oz (79.9 kg)  ?03/30/21 172 lb 6.4 oz (78.2 kg)  ?12/16/20 169 lb 3.2 oz (76.7 kg)  ?  ? ?GEN: Mildly obese, well nourished, well developed in no acute  distress ?HEENT: Normal ?NECK: No JVD; No carotid bruits ?LYMPHATICS: No lymphadenopathy ?CARDIAC: RRR, early peaking 1/6 aortic ejection murmur, no diastolic murmurs, rubs, gallops ?RESPIRATORY:  Clear to auscultation without rale

## 2021-08-02 ENCOUNTER — Other Ambulatory Visit: Payer: Self-pay | Admitting: Physician Assistant

## 2021-08-02 DIAGNOSIS — Z952 Presence of prosthetic heart valve: Secondary | ICD-10-CM

## 2021-09-06 ENCOUNTER — Ambulatory Visit
Admission: RE | Admit: 2021-09-06 | Discharge: 2021-09-06 | Disposition: A | Discharge status: Home / Self Care | Payer: Medicare Other | Source: Ambulatory Visit | Attending: Otolaryngology | Admitting: Otolaryngology

## 2021-09-06 DIAGNOSIS — E041 Nontoxic single thyroid nodule: Secondary | ICD-10-CM

## 2021-09-12 ENCOUNTER — Other Ambulatory Visit: Payer: Self-pay | Admitting: Family Medicine

## 2021-09-12 ENCOUNTER — Other Ambulatory Visit: Payer: Self-pay | Admitting: Nurse Practitioner

## 2021-09-12 DIAGNOSIS — G8929 Other chronic pain: Secondary | ICD-10-CM

## 2021-09-12 DIAGNOSIS — N3941 Urge incontinence: Secondary | ICD-10-CM

## 2021-09-27 ENCOUNTER — Other Ambulatory Visit: Payer: Self-pay | Admitting: Nurse Practitioner

## 2021-09-27 DIAGNOSIS — Z1231 Encounter for screening mammogram for malignant neoplasm of breast: Secondary | ICD-10-CM

## 2021-09-29 ENCOUNTER — Encounter: Payer: Self-pay | Admitting: Nurse Practitioner

## 2021-09-29 ENCOUNTER — Ambulatory Visit (INDEPENDENT_AMBULATORY_CARE_PROVIDER_SITE_OTHER): Payer: Medicare Other | Admitting: Nurse Practitioner

## 2021-09-29 VITALS — BP 135/79 | HR 78 | Temp 98.0°F | Ht 61.02 in | Wt 181.0 lb

## 2021-09-29 DIAGNOSIS — I2510 Atherosclerotic heart disease of native coronary artery without angina pectoris: Secondary | ICD-10-CM

## 2021-09-29 DIAGNOSIS — N3942 Incontinence without sensory awareness: Secondary | ICD-10-CM

## 2021-09-29 DIAGNOSIS — Z0000 Encounter for general adult medical examination without abnormal findings: Secondary | ICD-10-CM | POA: Diagnosis not present

## 2021-09-29 DIAGNOSIS — G8929 Other chronic pain: Secondary | ICD-10-CM

## 2021-09-29 DIAGNOSIS — I10 Essential (primary) hypertension: Secondary | ICD-10-CM

## 2021-09-29 DIAGNOSIS — E2839 Other primary ovarian failure: Secondary | ICD-10-CM

## 2021-09-29 DIAGNOSIS — M545 Low back pain, unspecified: Secondary | ICD-10-CM

## 2021-09-29 DIAGNOSIS — Z13820 Encounter for screening for osteoporosis: Secondary | ICD-10-CM

## 2021-09-29 DIAGNOSIS — N3941 Urge incontinence: Secondary | ICD-10-CM | POA: Diagnosis not present

## 2021-09-29 MED ORDER — OXYBUTYNIN CHLORIDE ER 10 MG PO TB24: 10.0000 mg | ORAL_TABLET | Freq: Two times a day (BID) | ORAL | 2 refills | Status: DC

## 2021-09-29 MED ORDER — MELOXICAM 7.5 MG PO TABS: 7.5000 mg | ORAL_TABLET | Freq: Every day | ORAL | 2 refills | Status: DC

## 2021-09-29 NOTE — Progress Notes (Signed)
Subjective:   Sierra Carpenter is a 76 y.o. female who presents for Medicare Annual (Subsequent) preventive examination. -increased stress and worry  --has a 35 year old aunt who is doing poorly. She is at home under hospice care.  -states that she is having some trouble with her bladder. States that she has started to wear depends at night as the medication for bladder control medication is wearing off too quickly.  -had basal cell carcinoma from her right hand. Has been hiving to stay out of the sun until she follows with dermatology in July.  -does have HTN and heart murmur. Will see cardiology in July. Is scheduled for repeat echocardiogram.  -she has no current concerns or complaints.   Review of Systems    Review of Systems  Constitutional:  Negative for chills, fever and malaise/fatigue.  HENT:  Negative for congestion, sinus pain and sore throat.   Eyes: Negative.   Respiratory:  Negative for cough, shortness of breath and wheezing.   Cardiovascular:  Negative for chest pain, palpitations and leg swelling.  Gastrointestinal:  Negative for constipation, diarrhea, nausea and vomiting.  Genitourinary:  Positive for frequency and urgency.       Nocturia   Musculoskeletal:  Negative for myalgias.  Skin: Negative.        The patient had area of basal cell carcinoma removed from right hand earlier this year. Will follow up with dermatology in July.   Neurological:  Negative for dizziness and headaches.  Endo/Heme/Allergies:  Does not bruise/bleed easily.  Psychiatric/Behavioral:  Negative for depression. The patient is nervous/anxious.           Objective:    Today's Vitals   09/29/21 0823 09/29/21 0839  BP: (!) 148/85 135/79  Pulse: 78   Temp: 98 F (36.7 C)   SpO2: 97%   Weight: 181 lb (82.1 kg)   Height: 5' 1.02" (1.55 m)    Body mass index is 34.17 kg/m.     11/17/2020    6:07 AM 11/13/2020    2:25 PM 10/29/2020   12:58 PM 10/21/2020    9:37 AM 09/09/2019    8:41 AM  08/12/2017    1:24 PM 06/01/2016   10:34 AM  Advanced Directives  Does Patient Have a Medical Advance Directive? Yes Yes Yes Yes Yes Yes Yes;No  Type of Advance Directive Living will;Healthcare Power of Attorney Living will;Healthcare Power of Attorney Living will;Healthcare Power of Attorney Living will;Healthcare Power of [place];Living will Living will   Does patient want to make changes to medical advance directive?   No - Patient declined No - Patient declined No - Patient declined No - Patient declined   Copy of [place] in Chart? Yes - validated most recent copy scanned in chart (See row information) No - copy requested No - copy requested No - copy requested No - copy requested      Current Medications (verified) Outpatient Encounter Medications as of 09/29/2021  Medication Sig   Ascorbic Acid (VITAMIN C) 1000 MG tablet Take 1,000 mg by mouth in the morning and at bedtime. Morning & at lunch   aspirin EC 81 MG tablet Take 81 mg by mouth daily. Swallow whole.   atorvastatin (LIPITOR) 80 MG tablet Take 1 tablet by mouth once daily   Carboxymeth-Glycerin-Polysorb (REFRESH OPTIVE ADVANCED) 0.5-1-0.5 % SOLN Place 1 drop into both eyes every 3 (three) hours as needed (dry eye).   Carboxymethylcellul-Glycerin (REFRESH OPTIVE) 1-0.9 %  GEL Place 1 drop into both eyes at bedtime.   Cholecalciferol (VITAMIN D3) 25 MCG (1000 UT) CAPS Take 1,000 Units by mouth 2 (two) times daily. Morning & at bedtime   Coenzyme Q10 (COQ10) 100 MG CAPS Take 100 mg by mouth in the morning and at bedtime.   fish oil-omega-3 fatty acids 1000 MG capsule Take 1,000 mg by mouth 3 (three) times daily with meals.   Multiple Vitamins-Minerals (PRESERVISION AREDS 2 PO) Take 1 tablet by mouth in the morning and at bedtime.   valsartan (DIOVAN) 160 MG tablet Take 1 tablet (160 mg total) by mouth daily.   [DISCONTINUED] meloxicam (MOBIC) 7.5 MG tablet Take 1 tablet by mouth once  daily   [DISCONTINUED] oxybutynin (DITROPAN-XL) 10 MG 24 hr tablet TAKE 1 TABLET BY MOUTH AT BEDTIME   meloxicam (MOBIC) 7.5 MG tablet Take 1 tablet (7.5 mg total) by mouth daily.   oxybutynin (DITROPAN-XL) 10 MG 24 hr tablet Take 1 tablet (10 mg total) by mouth 2 (two) times daily.   No facility-administered encounter medications on file as of 09/29/2021.    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 CBurt Knackand Dr. BCyndia Bent  Severe aortic stenosis    s/p TAVR 11/17/20   Past Surgical History:  Procedure Laterality Date   ABDOMINAL HYSTERECTOMY     fibroids; ovaries intact.   APPENDECTOMY     BILATERAL CARPAL TUNNEL RELEASE Bilateral    CARDIAC CATHETERIZATION     moderate stenosis in 1st diagonal & RCA   CESAREAN SECTION     CHOLECYSTECTOMY     EYE SURGERY Bilateral    cataracts   INNER EAR SURGERY Right    LITHOTRIPSY Right    RIGHT/LEFT HEART CATH AND CORONARY ANGIOGRAPHY N/A 10/21/2020   Procedure: RIGHT/LEFT HEART CATH AND CORONARY ANGIOGRAPHY;  Surgeon: CSherren Mocha MD;  [facility];  Service: Cardiovascular;  Laterality: N/A;   TEE WITHOUT CARDIOVERSION N/A 11/17/2020   Procedure: TRANSESOPHAGEAL ECHOCARDIOGRAM (TEE);  Surgeon: CSherren Mocha MD;  [facility]  Service: Open Heart Surgery;  Laterality: N/A;   TRANSCATHETER AORTIC VALVE REPLACEMENT, TRANSFEMORAL N/A 11/17/2020   Procedure: TRANSCATHETER AORTIC VALVE REPLACEMENT, TRANSFEMORAL;  Surgeon: CSherren Mocha MD;  [facility]  Service: Open Heart Surgery;   Laterality: N/A;   TRANSTHORACIC ECHOCARDIOGRAM  07/12/2010   EF=>55% with normal systolic function; trace MR/TR/PR   Family History  Problem Relation Age of Onset   Heart disease Father 235      AMI   Heart disease Brother    Heart disease Son    Heart disease Paternal Grandfather    Alzheimer's disease Mother    Heart disease Brother        CHF/CAD   COPD Brother    Heart disease Brother    Breast cancer Neg Hx    Social History   Socioeconomic History   Marital status: Widowed    Spouse name: Not on file   Number of children: 1   Years of education: 12   Highest education  level: Not on file  Occupational History   Occupation: retired    Comment: Production designer, theatre/television/film  Tobacco Use   Smoking status: Never   Smokeless tobacco: Never  Vaping Use   Vaping Use: Never used  Substance and Sexual Activity   Alcohol use: No   Drug use: No   Sexual activity: Not Currently    Birth control/protection: Post-menopausal, Surgical  Other Topics Concern   Not on file  Social History Narrative   Marital status: widowed since 2014 due to COPD; married x 37 years.      Children: 1 daughter; 1 son died of CHF age 72 pacemaker.  4 grandchildren.      Lives: alone      Employment: retired at age 35 from [employer].      Tobacco:  never       Alcohol:  never      Exercise: yes in 2019; walking 1-2 miles daily.  DDD lumbar limiting walking.      ADLs: independent with ADLs; drives.        Advanced Directives: none; has paper.  FULL CODE; no prolonged measures.  HCPOA: daughter/Misty Wenn-Maness.   Social Determinants of Health   Financial Resource Strain: Not on file  Food Insecurity: Not on file  Transportation Needs: Not on file  Physical Activity: Not on file  Stress: Not on file  Social Connections: Not on file    Physical Exam Vitals and nursing note reviewed.  Constitutional:      Appearance: Normal appearance. She is well-developed.  HENT:     Head: Normocephalic  and atraumatic.     Nose: Nose normal.     Mouth/Throat:     Mouth: Mucous membranes are moist.     Pharynx: Oropharynx is clear.  Eyes:     Extraocular Movements: Extraocular movements intact.     Conjunctiva/sclera: Conjunctivae normal.     Pupils: Pupils are equal, round, and reactive to light.  Neck:     Vascular: No carotid bruit.  Cardiovascular:     Rate and Rhythm: Normal rate and regular rhythm.     Pulses: Normal pulses.     Heart sounds: Normal heart sounds.  Pulmonary:     Effort: Pulmonary effort is normal.     Breath sounds: Normal breath sounds.  Abdominal:     General: Bowel sounds are normal. There is no distension.     Palpations: Abdomen is soft. There is no mass.     Tenderness: There is no abdominal tenderness. There is no right CVA tenderness, left CVA tenderness, guarding or rebound.     Hernia: No hernia is present.  Musculoskeletal:        General: Normal range of motion.     Cervical back: Normal range of motion and neck supple.  Lymphadenopathy:     Cervical: No cervical adenopathy.  Skin:    General: Skin is warm and dry.     Capillary Refill: Capillary refill takes less than 2 seconds.  Neurological:     General: No focal deficit present.     Mental Status: She is alert and oriented to person, place, and time.  Psychiatric:        Mood and Affect: Mood normal.        Behavior: Behavior normal.        Thought Content: Thought content normal.        Judgment: Judgment normal.      Tobacco Counseling Counseling given: Not  Answered    Diabetic?no     Activities of Daily Living    03/30/2021    9:56 AM 12/15/2020   10:02 AM  In your present state of health, do you have any difficulty performing the following activities:  Hearing? 1 1  Vision? 0 0  Difficulty concentrating or making decisions? 0 0  Walking or climbing stairs? 0 0  Dressing or bathing? 0 0  Doing errands, shopping? 0 0    Patient Care Team: Ronnell Freshwater, NP  as PCP - General (Family Medicine) Croitoru, Dani Gobble, MD as PCP - Cardiology (Cardiology) Croitoru, Dani Gobble, MD as Consulting Physician (Cardiology) Katy Fitch, Darlina Guys, MD as Consulting Physician (Ophthalmology)     Assessment:  1. Encounter for Medicare annual wellness exam Medicare wellness visit today   2. Estrogen deficiency Bone density test ordered. Patient will call to schedule.  - DG Bone Density; Future  3. Screening for osteoporosis Bone density test ordered. Patient will call to schedule.  - DG Bone Density; Future  4. Urge incontinence Increase ditropan XL 10 mg to twice daily dosing.  - oxybutynin (DITROPAN-XL) 10 MG 24 hr tablet; Take 1 tablet (10 mg total) by mouth 2 (two) times daily.  Dispense: 60 tablet; Refill: 2  5. Chronic bilateral low back pain without sciatica May take meloxicam 7.5 mg daily as needed for pain/inflammation. New prescription sent to her pharmacy today. .  - meloxicam (MOBIC) 7.5 MG tablet; Take 1 tablet (7.5 mg total) by mouth daily.  Dispense: 45 tablet; Refill: 2  6. Essential hypertension Stable. Continue medication as prescribed. Follow up with cardiology as scheduled.   7. Atherosclerosis of native coronary artery of native heart without angina pectoris Continue regular visits with cardiology as scheduled.    Hearing/Vision screen No results found.   Depression Screen    03/30/2021    9:56 AM 12/15/2020   10:01 AM 09/30/2020    9:53 AM 06/30/2020   10:40 AM 04/09/2020    3:38 PM 09/09/2019    8:39 AM 03/07/2019    8:06 AM  PHQ 2/9 Scores  PHQ - 2 Score 0 0 0 0 0 0 0  PHQ- 9 Score 1 0 0 0       Fall Risk    03/30/2021    9:56 AM 12/15/2020   10:01 AM 09/30/2020    9:52 AM 06/30/2020   10:40 AM 04/09/2020    3:38 PM  Fall Risk   Falls in the past year? 1 0 0 0 0  Number falls in past yr: 1 0 0  0  Injury with Fall? 0 0 0  0  Risk for fall due to :   No Fall Risks    Follow up Falls evaluation completed Falls evaluation  completed Falls evaluation completed Falls evaluation completed Falls evaluation completed    [place]:  Any stairs in or around the home? Yes  If so, are there any without handrails? No  Home free of loose throw rugs in walkways, pet beds, electrical cords, etc? Yes  Adequate lighting in your home to reduce risk of falls? Yes   ASSISTIVE DEVICES UTILIZED TO PREVENT FALLS:  Life alert? No  Use of a cane, walker or w/c? No  Grab bars in the bathroom? No  Shower chair or bench in shower? No  Elevated toilet seat or a handicapped toilet? Yes   TIMED UP AND GO:  Was the test performed? Yes .  Length of time to ambulate 10 feet: 10 sec.   Gait steady and fast without use of assistive device  Cognitive Function:        09/29/2021    8:26 AM 09/30/2020    9:42 AM 09/09/2019    8:35 AM 09/06/2018    3:13 PM  6CIT Screen  What Year? 0 points 0 points 0 points 0 points  What month? 0 points 0 points 0 points 0 points  What time? 0 points 0 points 0 points 0 points  Count back from 20 0 points 0 points 0 points 0 points  Months in reverse 0 points 0 points 0 points 0 points  Repeat phrase 0 points 0 points 0 points 0 points  Total Score 0 points 0 points 0 points 0 points    Immunizations Immunization History  Administered Date(s) Administered   Influenza Split 12/08/2012   Influenza, High Dose Seasonal PF 11/23/2018   Influenza, Quadrivalent, Recombinant, Inj, Pf 11/22/2017, 12/03/2020   Influenza,inj,Quad PF,6+ Mos 12/18/2014, 11/29/2016   Influenza-Unspecified 12/10/2013, 11/30/2015, 06/01/2016, 11/22/2017, 11/23/2018, 11/22/2019   PFIZER(Purple Top)SARS-COV-2 Vaccination 05/30/2019, 06/25/2019, 06/25/2019, 01/28/2020, 06/26/2020   PNEUMOCOCCAL CONJUGATE-20 12/03/2020   Pneumococcal Conjugate-13 12/10/2013   Pneumococcal Polysaccharide-23 12/18/2014   Tdap 08/07/2010   Zoster Recombinat (Shingrix) 08/27/2016    TDAP status: Up to  date  Flu Vaccine status: Up to date  Pneumococcal vaccine status: Up to date  Covid-19 vaccine status: Completed vaccines  Qualifies for Shingles Vaccine? Yes   Zostavax completed  pt will be get it done at [employer] [facility]  Topic Date Due   Zoster Vaccines- Shingrix (2 of 2) 10/22/2016   TETANUS/TDAP  08/06/2020   COVID-19 Vaccine (6 - Pfizer series) 08/21/2020   INFLUENZA VACCINE  11/02/2021   MAMMOGRAM  11/08/2022   Fecal DNA (Cologuard)  12/24/2023   Pneumonia Vaccine 43+ Years old  Completed   DEXA SCAN  Completed   Hepatitis C Screening  Completed   HPV VACCINES  Aged Out   COLONOSCOPY (Pts 45-45yr Insurance coverage will need to be confirmed)  Discontinued    Health Maintenance  Health Maintenance Due  Topic Date Due   Zoster Vaccines- Shingrix (2 of 2) 10/22/2016   TETANUS/TDAP  08/06/2020   COVID-19 Vaccine (6 - Pfizer series) 08/21/2020    Colorectal cancer screening: Type of screening: Cologuard. Completed 2022. Repeat every 0 years  Mammogram status: Ordered aug. 7. Pt provided with contact info and advised to call to schedule appt.   Bone Density status: Ordered 09/2021. Pt provided with contact info and advised to call to schedule appt.  Lung Cancer Screening: (Low Dose CT Chest recommended if Age 74-80years, 30 pack-year currently smoking OR have quit w/in 15years.) does not qualify.   Lung Cancer Screening Referral: no  Additional Screening:  Hepatitis C Screening: does not qualify; Completed 2022  Vision Screening: Recommended annual ophthalmology exams for early detection of glaucoma and other disorders of the eye. Is the patient up to date with their annual eye exam?  Yes  Who is the provider or what is the name of the office in which the patient attends annual eye exams? DR. GSchuyler AmorIf pt is not established with a provider, would they like to be referred to a provider to establish care? No .   Dental Screening:  Recommended annual dental exams for proper oral hygiene  Community Resource Referral / Chronic Care Management: CRR required this visit?  No  CCM required this visit?  No      Plan:     I have personally reviewed and noted the following in the patient's chart:   Medical and social history Use of alcohol, tobacco or illicit drugs  Current medications and supplements including opioid prescriptions.  Functional ability and status Nutritional status Physical activity Advanced directives List of other physicians Hospitalizations, surgeries, and ER visits in previous 12 months Vitals Screenings to include cognitive, depression, and falls Referrals and appointments  In addition, I have reviewed and discussed with patient certain preventive protocols, quality metrics, and best practice recommendations. A written personalized care plan for preventive services as well as general preventive health recommendations were provided to patient.     Leretha Pol, FNP-c    09/29/2021

## 2021-10-13 NOTE — Progress Notes (Unsigned)
[facility]                                     Cardiology Office Note:    Date:  10/18/2021   ID:  Sierra Carpenter, DOB January 01, 1946, MRN [id number]  PCP:  Ronnell Freshwater, NP  [facility] Cardiologist:  Sanda Klein, MD / Dr. Burt Knack & Dr. Cyndia Bent (TAVR) [facility] Electrophysiologist:  None   Referring MD: Ronnell Freshwater, NP   Chief Complaint  Patient presents with   Follow-up    1 year s/p TAVR   History of Present Illness:    Sierra Carpenter is a 77 y.o. 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   Sierra Carpenter had an echocardiogram on 03/05/2020 showing an indeterminate number of aortic valve cusp. There was severe calcification and thickening of the aortic valve. The mean gradient was 61 mmHg with an aortic valve area of 0.55 cm. Left ventricular ejection fraction was 65 to 70% with grade 1 diastolic dysfunction and mild LVH. She recently started having exertional shortness of breath and weakness with some dizziness when working in her yard during warm weather. Her most recent echo on 09/09/2020 showed a trileaflet aortic valve with severe calcification and thickening.  The aortic valve mean gradient was 42.5 mmHg with a peak gradient of 83.5 mmHg.  Peak velocity was 4.57 m/s.  Aortic valve area was 0.67 cm. There is mild aortic insufficiency.  Left ventricular ejection fraction was 70 to 75%. [facility] 10/22/20 showed patent coronary arteries with mild calcification and luminal irregularity, no significant stenoses. Normal right heart pressures. There was critical aortic stenosis with mean gradient 78 mmHg, peak to peak gradient 86 mmHg, calculated aortic valve area 0.37 cm.   She was evaluated by the multidisciplinary valve team and underwent successful TAVR with a 23 mm Edwards Sapien 3 Ultra THV via the TF approach on 11/17/20. Post operative echo showed EF 60%, normally  functioning TAVR with a mean gradient of 18.5 mmHg and no PVL. She was started on Plavix 75 mg daily without concurrent Asprin therapy given need for Mobic for arthritis pain. Has done quite well in follow up and named her valve Legrand Rams. BP was elevated last visit and Valsartan was increased from '80mg'$  to '160mg'$  daily.   In structural heart follow up she was doing very well with no concerns. She last saw Dr. Sallyanne Carpenter 07/09/21 at which time she was felt to be doing excellent. She was scheduled for follow up imaging of her thyroid nodules found on Pre TAVR CT scans. This was performed 09/06/21 which showed stable previously biopsied left inferior thyroid 2 cm TR 4 nodule with no new or enlarging thyroid nodule.  Today she states that she has been doing well with no concerns of chest pain, SOB, LE edema, orthopnea, palpitations, dizziness, or syncope. She remains very active with walking without any issues. Tolerating medications well. No bleeding in stool or urine.    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 CBurt Knackand Dr. BCyndia Bent  Severe aortic stenosis    s/p TAVR 11/17/20    Past Surgical History:  Procedure Laterality Date   ABDOMINAL HYSTERECTOMY     fibroids; ovaries intact.   APPENDECTOMY     BILATERAL CARPAL TUNNEL RELEASE Bilateral    CARDIAC CATHETERIZATION     moderate stenosis in 1st diagonal & RCA   CESAREAN SECTION     CHOLECYSTECTOMY     EYE SURGERY Bilateral    cataracts   INNER EAR SURGERY Right    LITHOTRIPSY Right    RIGHT/LEFT HEART CATH AND CORONARY ANGIOGRAPHY N/A 10/21/2020   Procedure: RIGHT/LEFT  HEART CATH AND CORONARY ANGIOGRAPHY;  Surgeon: CSherren Mocha MD;  [facility];  Service: Cardiovascular;  Laterality: N/A;   TEE WITHOUT CARDIOVERSION N/A 11/17/2020   Procedure: TRANSESOPHAGEAL ECHOCARDIOGRAM (TEE);  Surgeon: CSherren Mocha MD;  [place] [facility]  Service: Open Heart Surgery;  Laterality: N/A;   TRANSCATHETER AORTIC VALVE REPLACEMENT, TRANSFEMORAL N/A 11/17/2020   Procedure: TRANSCATHETER AORTIC VALVE REPLACEMENT, TRANSFEMORAL;  Surgeon: CSherren Mocha MD;  [place] [facility]  Service: Open Heart Surgery;  Laterality: N/A;   TRANSTHORACIC ECHOCARDIOGRAM  07/12/2010   EF=>55% with normal systolic function; trace MR/TR/PR    Current Medications: Current Meds  Medication Sig   albuterol (VENTOLIN HFA) 108 (90 Base) MCG/ACT inhaler Inhale into the lungs as needed.   Ascorbic Acid (VITAMIN C) 1000 MG tablet Take 1,000 mg by mouth in the morning and at bedtime. Morning & at lunch   aspirin EC 81 MG tablet Take 81 mg by mouth daily. Swallow whole.   atorvastatin (LIPITOR) 80 MG tablet Take 1 tablet by mouth once daily   Carboxymeth-Glycerin-Polysorb (REFRESH OPTIVE ADVANCED) 0.5-1-0.5 % SOLN Place 1 drop into both eyes every 3 (three) hours as needed (dry eye).   Carboxymethylcellul-Glycerin (REFRESH OPTIVE) 1-0.9 % GEL Place 1 drop into both eyes at bedtime.   Cholecalciferol (VITAMIN D3) 25 MCG (1000 UT) CAPS Take 1,000 Units by mouth 2 (two) times daily. Morning & at bedtime   Coenzyme Q10 (COQ10) 100 MG CAPS Take 100 mg by mouth in the morning and at bedtime.   fish oil-omega-3 fatty acids 1000 MG capsule Take 1,000 mg by mouth 3 (three) times daily with meals.   meloxicam (MOBIC) 7.5 MG tablet Take 1 tablet (7.5 mg total) by mouth daily. (Patient taking differently: Take 7.5 mg by mouth daily. Pt takes 1/2 tablet every other day.)   oxybutynin (DITROPAN-XL) 10 MG 24 hr tablet Take 1 tablet (10 mg total) by mouth 2 (two) times daily.   valsartan (DIOVAN) 160  MG tablet Take 1 tablet (160 mg total) by mouth daily.     Allergies:   Hydrochlorothiazide   Social History   Socioeconomic History   Marital status: Widowed    Spouse name: Not on file   Number of children: 1   Years of education: 131  Highest education level: Not on file  Occupational History   Occupation: retired    Comment: PProduction designer, theatre/television/film Tobacco Use   Smoking status: Never   Smokeless tobacco: Never  Vaping Use   Vaping Use: Never used  Substance and Sexual Activity   Alcohol use: No   Drug use: No   Sexual activity: Not Currently    Birth control/protection: Post-menopausal, Surgical  Other Topics Concern  Not on file  Social History Narrative   Marital status: widowed since 2014 due to COPD; married x 37 years.      Children: 1 daughter; 1 son died of CHF age 46 pacemaker.  4 grandchildren.      Lives: alone      Employment: retired at age 74 from [employer].      Tobacco:  never       Alcohol:  never      Exercise: yes in 2019; walking 1-2 miles daily.  DDD lumbar limiting walking.      ADLs: independent with ADLs; drives.        Advanced Directives: none; has paper.  FULL CODE; no prolonged measures.  HCPOA: daughter/Misty Wenn-Maness.   Social Determinants of Health   Financial Resource Strain: Not on file  Food Insecurity: Not on file  Transportation Needs: Not on file  Physical Activity: Not on file  Stress: Not on file  Social Connections: Not on file     Family History: The patient's family history includes Alzheimer's disease in her mother; COPD in her brother; Heart disease in her brother, brother, brother, paternal grandfather, and son; Heart disease (age of onset: 28) in her father. There is no history of Breast cancer.  ROS:   Please see the history of present illness.    All other systems reviewed and are negative.  EKGs/Labs/Other Studies Reviewed:    The following studies were reviewed today:  Echocardiogram 10/15/21:  Left  ventricular ejection fraction, by estimation, is 70 to 75%. The left ventricle has hyperdynamic function. The left ventricle has no regional wall motion abnormalities. There is moderate asymmetric left ventricular hypertrophy of the basal-septal segment. Left ventricular diastolic parameters are consistent with Grade I diastolic dysfunction (impaired relaxation). The average left ventricular global longitudinal strain is -22.2 %. 1. Right ventricular systolic function is normal. The right ventricular size is normal. There is normal pulmonary artery systolic pressure. The estimated right ventricular systolic pressure is 93.7 mmHg. 2. The mitral valve is normal in structure. Trivial mitral valve regurgitation. No evidence of mitral stenosis. 3. There is a 23 mm Edwards Sapien prosthetic, stented (TAVR) valve present in the aortic position Aortic valve regurgitation is not visualized. Echo findings are consistent with normal structure and function of the aortic valve prosthesis. Aortic valve mean gradient measures 8.0 mmHg. 4. 5. Aortic dilatation noted. There is dilatation of the ascending aorta, measuring 40 mm. The inferior vena cava is normal in size with greater than 50% respiratory variability, suggesting right atrial pressure of 3 mmHg.   TAVR OPERATIVE NOTE     Date of Procedure:                11/17/2020   Preoperative Diagnosis:      Severe Aortic Stenosis    Postoperative Diagnosis:    Same    Procedure:        Transcatheter Aortic Valve Replacement - Percutaneous Left Transfemoral Approach             Edwards Sapien 3 Ultra THV (size 23 mm, model # 9750TFX, serial # [id number])              Co-Surgeons:                        Gaye Pollack, MD and Sherren Mocha, MD       Anesthesiologist:  Collins Scotland, MD   Echocardiographer:              Osborne Oman, MD   Pre-operative Echo Findings: Severe aortic stenosis Normal left ventricular systolic  function   _____________     Echo 11/18/2020: IMPRESSIONS   1. The aortic valve has been replaced with a 23 mm Edwards Sapient Valve  Aortic valve mean gradient measures 18.5 mmHg. DVI is 0.37.   2. Left ventricular ejection fraction, by estimation, is 60 to 65%. The  left ventricle has normal function.   Comparison(s): A prior study was performed on 11/17/2020. Increase in  prosthetic gradients with no evidence of paravalvular leak.    _____________________   Echo 12/16/20 IMPRESSIONS  1. Left ventricular ejection fraction, by estimation, is 55 to 60%. Left ventricular ejection fraction by 3D volume is 57 %. The left ventricle has normal function. The left ventricle has no regional wall motion abnormalities. Left ventricular diastolic  parameters are consistent with Grade I diastolic dysfunction (impaired relaxation).  2. Right ventricular systolic function is normal. The right ventricular size is normal.  3. The mitral valve is normal in structure. No evidence of mitral valve regurgitation.  4. The aortic valve has been repaired/replaced. Aortic valve regurgitation is not visualized. No aortic stenosis is present.  5. Aortic dilatation noted. There is mild dilatation of the ascending aorta, measuring 40 mm.   Thyroid US 09/06/21:  IMPRESSION: Stable previously biopsied left inferior thyroid 2 cm TR 4 nodule. Correlate with prior pathology.   No new or enlarging thyroid nodule.   The above is in keeping with the ACR TI-RADS recommendations - J Am Coll Radiol 2017;14:587-595.  EKG:  EKG is not ordered today.    Recent Labs: 10/15/2021: ALT 21; BUN 19; Creatinine, Ser 1.09; Hemoglobin 16.4; Platelets 242; Potassium 4.4; Sodium 145  Recent Lipid Panel    Component Value Date/Time   CHOL 137 10/15/2021 1220   TRIG 116 10/15/2021 1220   HDL 39 (L) 10/15/2021 1220   CHOLHDL 3.5 10/15/2021 1220   CHOLHDL 3.2 11/18/2015 1111   VLDL 29 11/18/2015 1111   LDLCALC 77 10/15/2021 1220    Physical Exam:    VS:  BP 128/72   Pulse 68   Ht '5\' 1"'$  (1.549 m)   Wt 176 lb 12.8 oz (80.2 kg)   SpO2 97%   BMI 33.41 kg/m     Wt Readings from Last 3 Encounters:  10/15/21 176 lb 12.8 oz (80.2 kg)  09/29/21 181 lb (82.1 kg)  07/09/21 176 lb 3.2 oz (79.9 kg)    General: Well developed, well nourished, NAD Neck: Negative for carotid bruits. No JVD Lungs:Clear to ausculation bilaterally. No wheezes, rales, or rhonchi. Breathing is unlabored. Cardiovascular: RRR with S1 S2. No murmurs Extremities: No edema. Neuro: Alert and oriented. No focal deficits. No facial asymmetry. MAE spontaneously. Psych: Responds to questions appropriately with normal affect.    ASSESSMENT/PLAN:    Severe AS s/p TAVR: Doing very well with NYHA class I symptoms. Echo today shows normal structure and function of the aortic valve prosthesis with a mean gradient at 8.0 mmHg, and AVA by VTI at 1.91 cm2. Continue ASA '81mg'$  QD. No need for SBE, patient is edentulous. Plan follow up with Dr. Sallyanne Carpenter in 07/2022.   Aortic dilatation: Measuring 41m. Continue to monitor with surveillance imaging.     HTN: Stable with no changes needed.    Thyroid nodules: Follow up UKoreaperformed with stability of nodules. Continue to  follow with PCP for this.     Medication Adjustments/Labs and Tests Ordered: Current medicines are reviewed at length with the patient today.  Concerns regarding medicines are outlined above.  Orders Placed This Encounter  Procedures   CBC with Differential/Platelet   Comprehensive metabolic panel   Lipid panel   No orders of the defined types were placed in this encounter.   Patient Instructions  Medication Instructions:  Your physician recommends that you continue on your current medications as directed. Please refer to the Current Medication list given to you today.  *If you need a refill on your cardiac medications before your next appointment, please call your pharmacy*  Lab  Work: Your physician recommends that you have lab work today- CMET, CBC, and Lipid.  If you have labs (blood work) drawn today and your tests are completely normal, you will receive your results only by: [place] (if you have MyChart) OR A paper copy in the mail If you have any lab test that is abnormal or we need to change your treatment, we will call you to review the results.  Follow-Up: At [facility], you and your health needs are our priority.  As part of our continuing mission to provide you with exceptional heart care, we have created designated Provider Care Teams.  These Care Teams include your primary Cardiologist (physician) and Advanced Practice Providers (APPs -  Physician Assistants and Nurse Practitioners) who all work together to provide you with the care you need, when you need it.  We recommend signing up for the patient portal called "MyChart".  Sign up information is provided on this After Visit Summary.  MyChart is used to connect with patients for Virtual Visits (Telemedicine).  Patients are able to view lab/test results, encounter notes, upcoming appointments, etc.  Non-urgent messages can be sent to your provider as well.   To learn more about what you can do with MyChart, go to [email or web address].    Your next appointment:   9 months  The format for your next appointment:   In Person  Provider:   Sanda Klein, MD      Important Information About Sugar         Signed, Kathyrn Drown, NP  10/18/2021 8:04 AM    [place]

## 2021-10-15 ENCOUNTER — Ambulatory Visit (HOSPITAL_COMMUNITY): Payer: Medicare Other | Attending: Cardiology

## 2021-10-15 ENCOUNTER — Ambulatory Visit: Payer: Medicare Other | Admitting: Cardiology

## 2021-10-15 VITALS — BP 128/72 | HR 68 | Ht 61.0 in | Wt 176.8 lb

## 2021-10-15 DIAGNOSIS — I10 Essential (primary) hypertension: Secondary | ICD-10-CM

## 2021-10-15 DIAGNOSIS — I77810 Thoracic aortic ectasia: Secondary | ICD-10-CM | POA: Diagnosis not present

## 2021-10-15 DIAGNOSIS — E785 Hyperlipidemia, unspecified: Secondary | ICD-10-CM | POA: Diagnosis not present

## 2021-10-15 DIAGNOSIS — Z952 Presence of prosthetic heart valve: Secondary | ICD-10-CM | POA: Diagnosis not present

## 2021-10-15 LAB — ECHOCARDIOGRAM COMPLETE
AR max vel: 1.75 cm2
AV Area VTI: 1.91 cm2
AV Area mean vel: 1.75 cm2
AV Mean grad: 8 mmHg
AV Peak grad: 12.8 mmHg
Ao pk vel: 1.79 m/s
Area-P 1/2: 2.61 cm2
S' Lateral: 1.7 cm

## 2021-10-15 NOTE — Patient Instructions (Signed)
Medication Instructions:  Your physician recommends that you continue on your current medications as directed. Please refer to the Current Medication list given to you today.  *If you need a refill on your cardiac medications before your next appointment, please call your pharmacy*  Lab Work: Your physician recommends that you have lab work today- CMET, CBC, and Lipid.  If you have labs (blood work) drawn today and your tests are completely normal, you will receive your results only by: [place] (if you have MyChart) OR A paper copy in the mail If you have any lab test that is abnormal or we need to change your treatment, we will call you to review the results.  Follow-Up: At [facility], you and your health needs are our priority.  As part of our continuing mission to provide you with exceptional heart care, we have created designated Provider Care Teams.  These Care Teams include your primary Cardiologist (physician) and Advanced Practice Providers (APPs -  Physician Assistants and Nurse Practitioners) who all work together to provide you with the care you need, when you need it.  We recommend signing up for the patient portal called "MyChart".  Sign up information is provided on this After Visit Summary.  MyChart is used to connect with patients for Virtual Visits (Telemedicine).  Patients are able to view lab/test results, encounter notes, upcoming appointments, etc.  Non-urgent messages can be sent to your provider as well.   To learn more about what you can do with MyChart, go to [email or web address].    Your next appointment:   9 months  The format for your next appointment:   In Person  Provider:   Sanda Klein, MD      Important Information About Sugar

## 2021-10-16 LAB — CBC WITH DIFFERENTIAL/PLATELET
Basophils Absolute: 0.1 10*3/uL (ref 0.0–0.2)
Basos: 1 %
EOS (ABSOLUTE): 0.7 10*3/uL — ABNORMAL HIGH (ref 0.0–0.4)
Eos: 8 %
Hematocrit: 48.1 % — ABNORMAL HIGH (ref 34.0–46.6)
Hemoglobin: 16.4 g/dL — ABNORMAL HIGH (ref 11.1–15.9)
Immature Grans (Abs): 0 10*3/uL (ref 0.0–0.1)
Immature Granulocytes: 0 %
Lymphocytes Absolute: 2.1 10*3/uL (ref 0.7–3.1)
Lymphs: 23 %
MCH: 31.5 pg (ref 26.6–33.0)
MCHC: 34.1 g/dL (ref 31.5–35.7)
MCV: 92 fL (ref 79–97)
Monocytes Absolute: 0.7 10*3/uL (ref 0.1–0.9)
Monocytes: 7 %
Neutrophils Absolute: 5.6 10*3/uL (ref 1.4–7.0)
Neutrophils: 61 %
Platelets: 242 10*3/uL (ref 150–450)
RBC: 5.21 x10E6/uL (ref 3.77–5.28)
RDW: 12.9 % (ref 11.7–15.4)
WBC: 9.1 10*3/uL (ref 3.4–10.8)

## 2021-10-16 LAB — COMPREHENSIVE METABOLIC PANEL
ALT: 21 IU/L (ref 0–32)
AST: 18 IU/L (ref 0–40)
Albumin/Globulin Ratio: 2.4 — ABNORMAL HIGH (ref 1.2–2.2)
Albumin: 4.8 g/dL (ref 3.8–4.8)
Alkaline Phosphatase: 116 IU/L (ref 44–121)
BUN/Creatinine Ratio: 17 (ref 12–28)
BUN: 19 mg/dL (ref 8–27)
Bilirubin Total: 0.4 mg/dL (ref 0.0–1.2)
CO2: 24 mmol/L (ref 20–29)
Calcium: 10.3 mg/dL (ref 8.7–10.3)
Chloride: 102 mmol/L (ref 96–106)
Creatinine, Ser: 1.09 mg/dL — ABNORMAL HIGH (ref 0.57–1.00)
Globulin, Total: 2 g/dL (ref 1.5–4.5)
Glucose: 91 mg/dL (ref 70–99)
Potassium: 4.4 mmol/L (ref 3.5–5.2)
Sodium: 145 mmol/L — ABNORMAL HIGH (ref 134–144)
Total Protein: 6.8 g/dL (ref 6.0–8.5)
eGFR: 54 mL/min/{1.73_m2} — ABNORMAL LOW (ref >59)

## 2021-10-16 LAB — LIPID PANEL
Chol/HDL Ratio: 3.5 ratio (ref 0.0–4.4)
Cholesterol, Total: 137 mg/dL (ref 100–199)
HDL: 39 mg/dL — ABNORMAL LOW (ref >39)
LDL Chol Calc (NIH): 77 mg/dL (ref 0–99)
Triglycerides: 116 mg/dL (ref 0–149)
VLDL Cholesterol Cal: 21 mg/dL (ref 5–40)

## 2021-10-21 ENCOUNTER — Ambulatory Visit
Admission: RE | Admit: 2021-10-21 | Discharge: 2021-10-21 | Disposition: A | Discharge status: Home / Self Care | Payer: Medicare Other | Source: Ambulatory Visit | Attending: Nurse Practitioner | Admitting: Nurse Practitioner

## 2021-10-21 DIAGNOSIS — E2839 Other primary ovarian failure: Secondary | ICD-10-CM

## 2021-10-21 DIAGNOSIS — Z13820 Encounter for screening for osteoporosis: Secondary | ICD-10-CM

## 2021-10-21 NOTE — Progress Notes (Signed)
Please let the patient know that her bone density test was normal. Thanks so much.   -HB

## 2021-11-08 ENCOUNTER — Ambulatory Visit
Admission: RE | Admit: 2021-11-08 | Discharge: 2021-11-08 | Disposition: A | Discharge status: Home / Self Care | Payer: Medicare Other | Source: Ambulatory Visit | Attending: Nurse Practitioner | Admitting: Nurse Practitioner

## 2021-11-08 DIAGNOSIS — Z1231 Encounter for screening mammogram for malignant neoplasm of breast: Secondary | ICD-10-CM

## 2021-11-09 NOTE — Progress Notes (Signed)
Additional breast imaging to be scheduled

## 2021-11-10 ENCOUNTER — Other Ambulatory Visit: Payer: Self-pay | Admitting: Nurse Practitioner

## 2021-11-10 DIAGNOSIS — R928 Other abnormal and inconclusive findings on diagnostic imaging of breast: Secondary | ICD-10-CM

## 2021-11-16 ENCOUNTER — Telehealth: Payer: Self-pay | Admitting: Nurse Practitioner

## 2021-11-16 NOTE — Telephone Encounter (Signed)
Sorry. Yes. All orders are now signed.

## 2021-11-16 NOTE — Telephone Encounter (Signed)
[facility] called and stated her order for diagnostic mammogram has not been signed. She has an appointment this am at 11:30. Can you please go in system and sign it?

## 2021-11-25 ENCOUNTER — Other Ambulatory Visit: Payer: Self-pay | Admitting: Nurse Practitioner

## 2021-11-25 ENCOUNTER — Ambulatory Visit
Admission: RE | Admit: 2021-11-25 | Discharge: 2021-11-25 | Disposition: A | Discharge status: Home / Self Care | Payer: Medicare Other | Source: Ambulatory Visit | Attending: Nurse Practitioner | Admitting: Nurse Practitioner

## 2021-11-25 DIAGNOSIS — R921 Mammographic calcification found on diagnostic imaging of breast: Secondary | ICD-10-CM

## 2021-11-25 DIAGNOSIS — R928 Other abnormal and inconclusive findings on diagnostic imaging of breast: Secondary | ICD-10-CM

## 2021-11-25 NOTE — Progress Notes (Signed)
Right breast diagnostic mammogram with suspicious calcifications. Biopsy recommended

## 2021-12-02 ENCOUNTER — Ambulatory Visit
Admission: RE | Admit: 2021-12-02 | Discharge: 2021-12-02 | Disposition: A | Discharge status: Home / Self Care | Payer: Medicare Other | Source: Ambulatory Visit | Attending: Nurse Practitioner | Admitting: Nurse Practitioner

## 2021-12-02 DIAGNOSIS — R921 Mammographic calcification found on diagnostic imaging of breast: Secondary | ICD-10-CM

## 2021-12-02 HISTORY — PX: BREAST BIOPSY: SHX20

## 2021-12-02 NOTE — Progress Notes (Signed)
Stereotactic biopsy

## 2021-12-07 NOTE — Progress Notes (Signed)
Negative for malignancy

## 2022-01-11 ENCOUNTER — Other Ambulatory Visit: Payer: Self-pay | Admitting: Nurse Practitioner

## 2022-01-11 DIAGNOSIS — N3941 Urge incontinence: Secondary | ICD-10-CM

## 2022-02-07 ENCOUNTER — Other Ambulatory Visit: Payer: Medicare Other | Admitting: Physician Assistant

## 2022-02-07 ENCOUNTER — Other Ambulatory Visit: Payer: Self-pay | Admitting: Nurse Practitioner

## 2022-02-07 DIAGNOSIS — N3941 Urge incontinence: Secondary | ICD-10-CM

## 2022-03-06 ENCOUNTER — Other Ambulatory Visit: Payer: Self-pay | Admitting: Nurse Practitioner

## 2022-03-06 DIAGNOSIS — N3941 Urge incontinence: Secondary | ICD-10-CM

## 2022-03-07 NOTE — Telephone Encounter (Signed)
Office visit required for refills

## 2022-03-19 IMAGING — MG MM DIGITAL SCREENING BILAT W/ TOMO AND CAD
8 series · 8 of 24 positions shown · non-contrast
Comparison: Previous exam(s).

CLINICAL DATA: Screening.

EXAM:
DIGITAL SCREENING BILATERAL MAMMOGRAM WITH TOMOSYNTHESIS AND CAD
TECHNIQUE: Bilateral screening digital craniocaudal and mediolateral oblique
mammograms were obtained. Bilateral screening digital breast
tomosynthesis was performed. The images were evaluated with
computer-aided detection.

[R CC synth-2D]
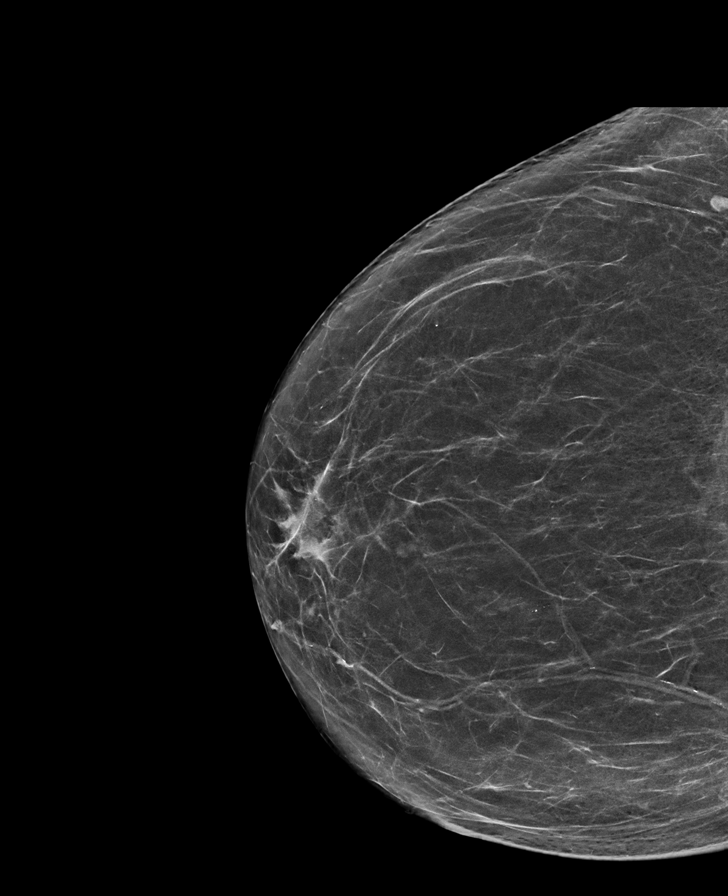

[L CC synth-2D]
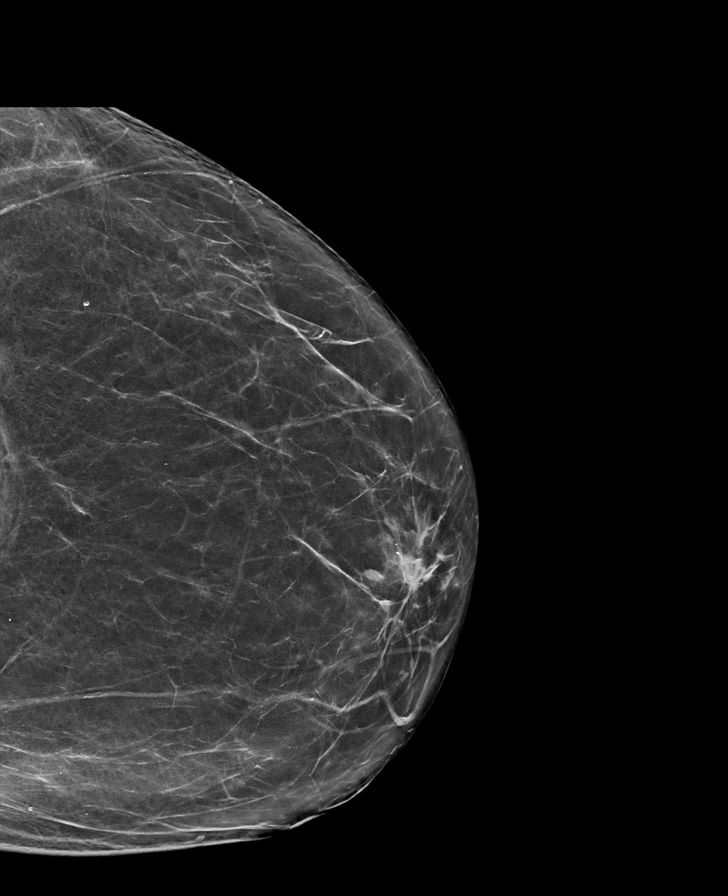

[R MLO synth-2D]
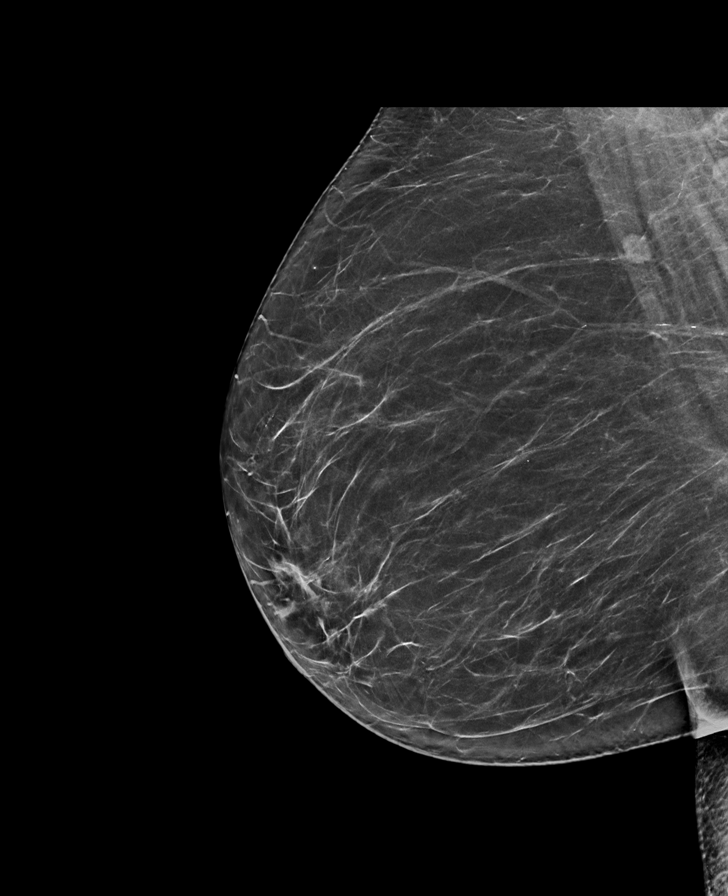

[L MLO synth-2D]
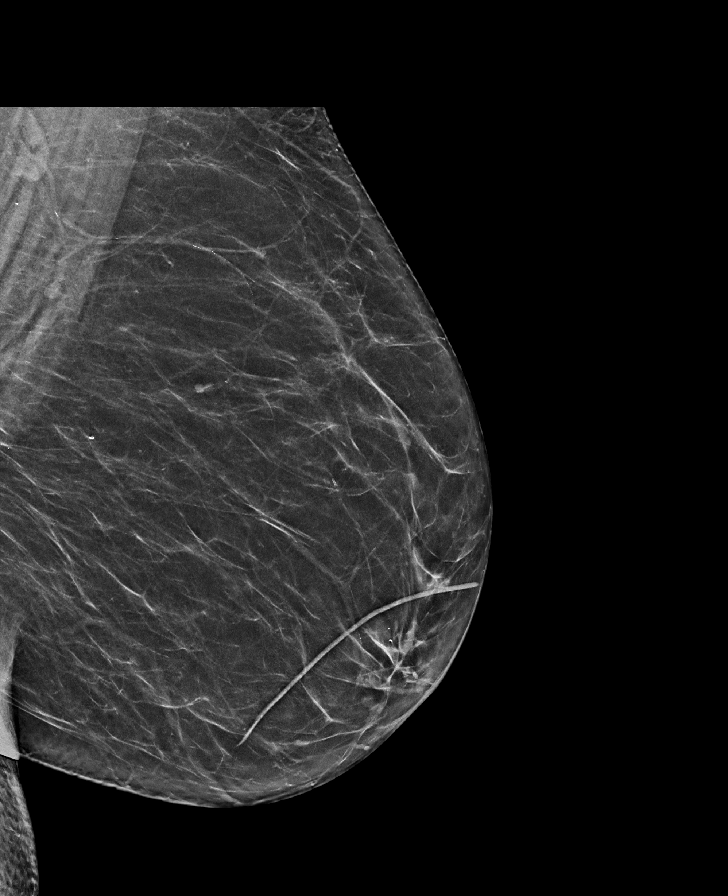

[R CC tomo · tomo slice 37/73.0]
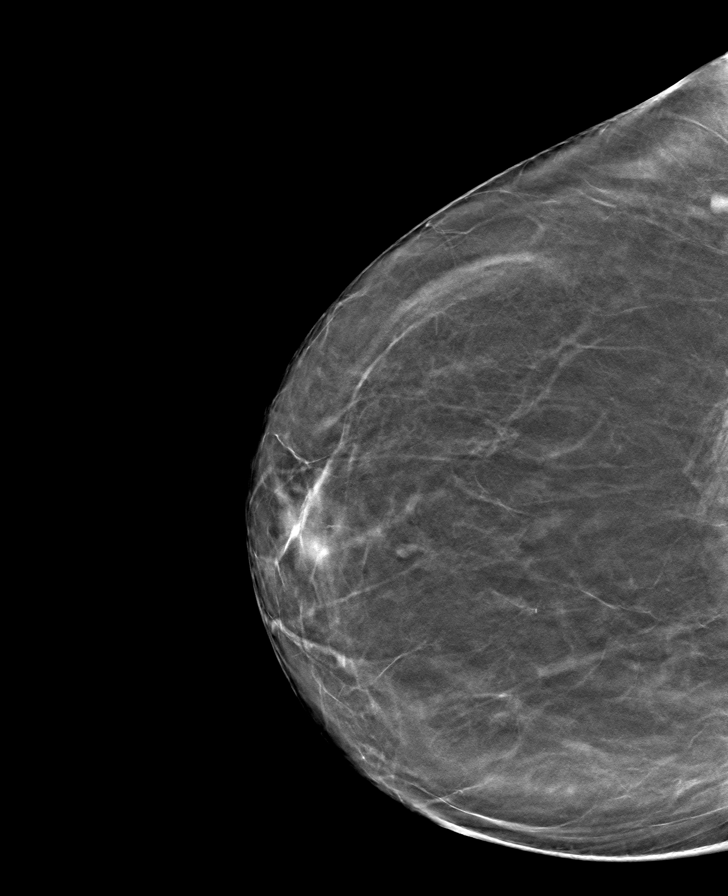

[L MLO tomo · tomo slice 38/75.0]
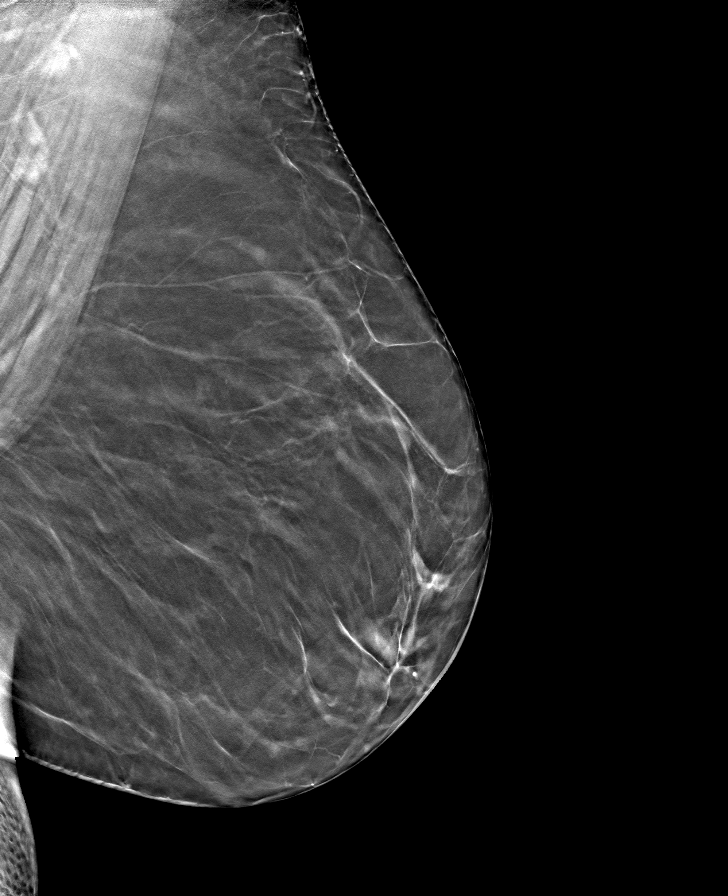

[R MLO tomo · tomo slice 41/80.0]
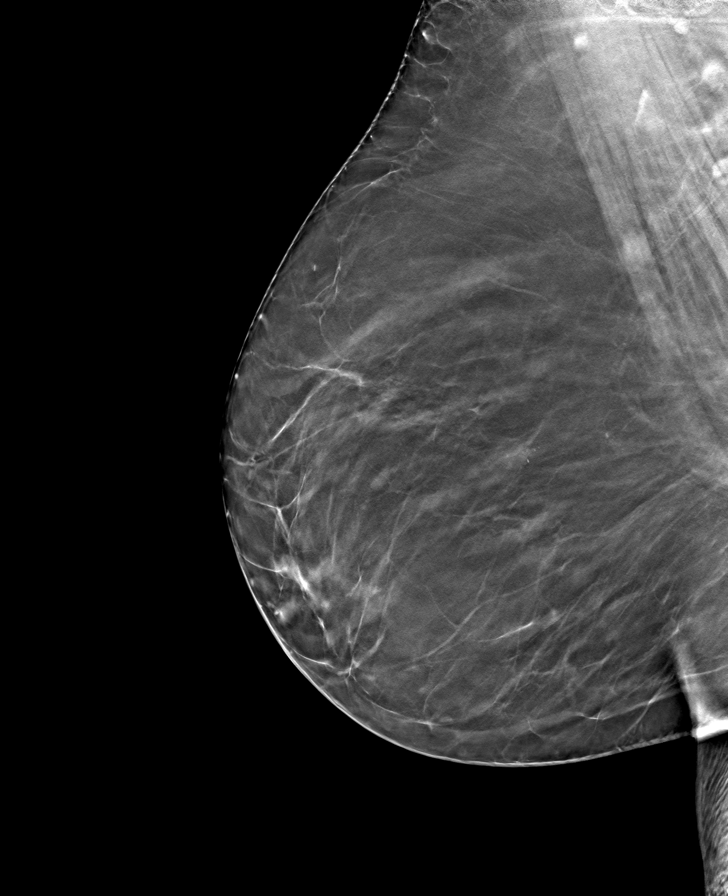

[L CC tomo · tomo slice 37/73.0]
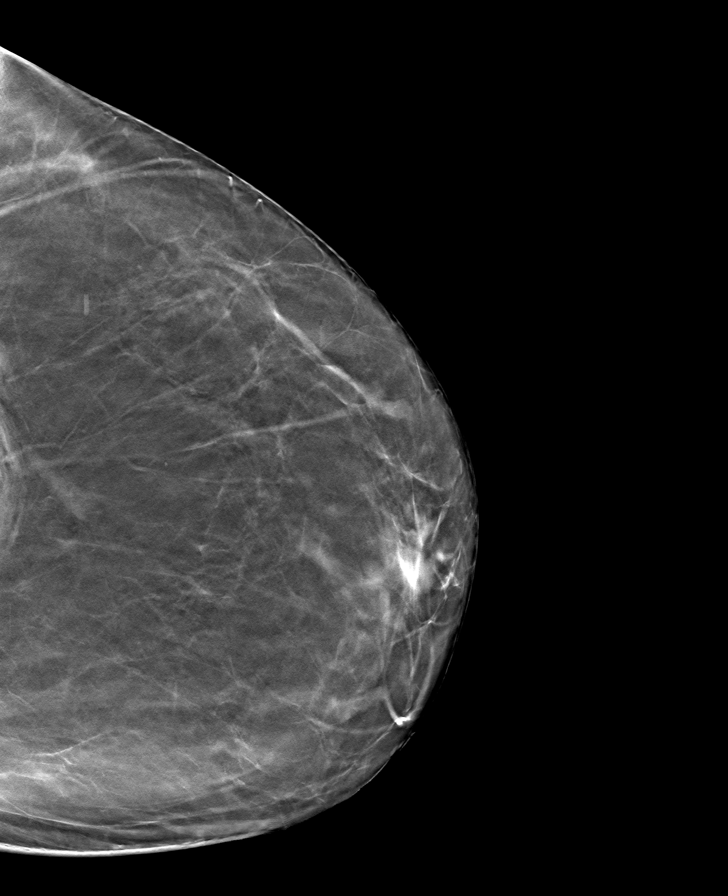

[8 of 24 positions shown; findings below may reference images not displayed]

ACR Breast Density Category b: There are scattered areas of
fibroglandular density.
FINDINGS: There are no findings suspicious for malignancy.
IMPRESSION: No mammographic evidence of malignancy. A result letter of this
screening mammogram will be mailed directly to the patient.

RECOMMENDATION:
Screening mammogram in one year. (Code:51-O-LD2)

BI-RADS CATEGORY  1: Negative.

## 2022-03-30 ENCOUNTER — Other Ambulatory Visit: Payer: Self-pay | Admitting: Cardiovascular Disease

## 2022-03-31 ENCOUNTER — Ambulatory Visit: Payer: Medicare Other | Admitting: Nurse Practitioner

## 2022-04-10 ENCOUNTER — Other Ambulatory Visit: Payer: Self-pay | Admitting: Nurse Practitioner

## 2022-04-10 DIAGNOSIS — N3941 Urge incontinence: Secondary | ICD-10-CM

## 2022-05-17 ENCOUNTER — Encounter: Payer: Self-pay | Admitting: Nurse Practitioner

## 2022-05-17 ENCOUNTER — Ambulatory Visit: Payer: Medicare Other | Admitting: Nurse Practitioner

## 2022-05-17 VITALS — BP 138/80 | HR 73 | Ht 61.02 in | Wt 181.0 lb

## 2022-05-17 DIAGNOSIS — I10 Essential (primary) hypertension: Secondary | ICD-10-CM

## 2022-05-17 DIAGNOSIS — N3941 Urge incontinence: Secondary | ICD-10-CM | POA: Diagnosis not present

## 2022-05-17 DIAGNOSIS — I350 Nonrheumatic aortic (valve) stenosis: Secondary | ICD-10-CM

## 2022-05-17 MED ORDER — OXYBUTYNIN CHLORIDE ER 10 MG PO TB24: 10.0000 mg | ORAL_TABLET | Freq: Two times a day (BID) | ORAL | 1 refills | Status: DC

## 2022-05-17 NOTE — Progress Notes (Signed)
Established patient visit   Patient: Sierra Carpenter   DOB: 05-14-1945   77 y.o. Female  MRN: [id number] Visit Date: 05/17/2022   Chief Complaint  Patient presents with   Follow-up   Subjective    HPI  Follow up  -has some increased personal stress due to family circumstances  -grandson was in a major car accident which severely injured him and killed her grandson's girlfriend who was in the passenger seat.  -hypertension  -well managed.  --does need refill for bladder medication. -no other concerns or complaints today    Medications: Outpatient Medications Prior to Visit  Medication Sig   albuterol (VENTOLIN HFA) 108 (90 Base) MCG/ACT inhaler Inhale into the lungs as needed.   Ascorbic Acid (VITAMIN C) 1000 MG tablet Take 1,000 mg by mouth in the morning and at bedtime. Morning & at lunch   aspirin EC 81 MG tablet Take 81 mg by mouth daily. Swallow whole.   atorvastatin (LIPITOR) 80 MG tablet Take 1 tablet by mouth once daily   Cholecalciferol (VITAMIN D3) 25 MCG (1000 UT) CAPS Take 1,000 Units by mouth 2 (two) times daily. Morning & at bedtime   Coenzyme Q10 (COQ10) 100 MG CAPS Take 100 mg by mouth in the morning and at bedtime.   fish oil-omega-3 fatty acids 1000 MG capsule Take 1,000 mg by mouth 3 (three) times daily with meals.   meloxicam (MOBIC) 7.5 MG tablet Take 1 tablet (7.5 mg total) by mouth daily. (Patient taking differently: Take 7.5 mg by mouth daily. Pt takes 1/2 tablet every other day.)   valsartan (DIOVAN) 160 MG tablet Take 1 tablet (160 mg total) by mouth daily.   [DISCONTINUED] oxybutynin (DITROPAN-XL) 10 MG 24 hr tablet Take 1 tablet by mouth twice daily   [DISCONTINUED] Carboxymeth-Glycerin-Polysorb (REFRESH OPTIVE ADVANCED) 0.5-1-0.5 % SOLN Place 1 drop into both eyes every 3 (three) hours as needed (dry eye).   [DISCONTINUED] Carboxymethylcellul-Glycerin (REFRESH OPTIVE) 1-0.9 % GEL Place 1 drop into both eyes at bedtime.   No facility-administered  medications prior to visit.    Review of Systems  Constitutional:  Negative for activity change, appetite change, chills, fatigue and fever.  HENT:  Negative for congestion, postnasal drip, rhinorrhea, sinus pressure, sinus pain, sneezing and sore throat.   Eyes: Negative.   Respiratory:  Negative for cough, chest tightness, shortness of breath and wheezing.   Cardiovascular:  Negative for chest pain and palpitations.  Gastrointestinal:  Negative for abdominal pain, constipation, diarrhea, nausea and vomiting.  Endocrine: Negative for cold intolerance, heat intolerance, polydipsia and polyuria.  Genitourinary:  Positive for frequency. Negative for dyspareunia, dysuria, flank pain and urgency.  Musculoskeletal:  Negative for arthralgias, back pain and myalgias.  Skin:  Negative for rash.  Allergic/Immunologic: Negative for environmental allergies.  Neurological:  Negative for dizziness, weakness and headaches.  Hematological:  Negative for adenopathy.  Psychiatric/Behavioral:  The patient is not nervous/anxious.     Last CBC Lab Results  Component Value Date   WBC 9.1 10/15/2021   HGB 16.4 (H) 10/15/2021   HCT 48.1 (H) 10/15/2021   MCV 92 10/15/2021   MCH 31.5 10/15/2021   RDW 12.9 10/15/2021   PLT 242 [id number]   Last metabolic panel Lab Results  Component Value Date   GLUCOSE 91 10/15/2021   NA 145 (H) 10/15/2021   K 4.4 10/15/2021   CL 102 10/15/2021   CO2 24 10/15/2021   BUN 19 10/15/2021   CREATININE 1.09 (H) 10/15/2021   EGFR  54 (L) 10/15/2021   CALCIUM 10.3 10/15/2021   PROT 6.8 10/15/2021   ALBUMIN 4.8 10/15/2021   LABGLOB 2.0 10/15/2021   AGRATIO 2.4 (H) 10/15/2021   BILITOT 0.4 10/15/2021   ALKPHOS 116 10/15/2021   AST 18 10/15/2021   ALT 21 10/15/2021   ANIONGAP 8 11/18/2020   Last lipids Lab Results  Component Value Date   CHOL 137 10/15/2021   HDL 39 (L) 10/15/2021   LDLCALC 77 10/15/2021   TRIG 116 10/15/2021   CHOLHDL 3.5 10/15/2021   Last  hemoglobin A1c Lab Results  Component Value Date   HGBA1C 5.5 09/23/2020   Last thyroid functions Lab Results  Component Value Date   TSH 2.670 09/23/2020   T3TOTAL 136 03/14/2018   Last vitamin D Lab Results  Component Value Date   VD25OH 38.0 09/23/2020       Objective     Today's Vitals   05/17/22 1010  BP: 138/80  Pulse: 73  SpO2: 97%  Weight: 181 lb (82.1 kg)  Height: 5' 1.02" (1.55 m)   Body mass index is 34.18 kg/m.  BP Readings from Last 3 Encounters:  05/17/22 138/80  10/15/21 128/72  09/29/21 135/79    Wt Readings from Last 3 Encounters:  05/17/22 181 lb (82.1 kg)  10/15/21 176 lb 12.8 oz (80.2 kg)  09/29/21 181 lb (82.1 kg)    Physical Exam Vitals and nursing note reviewed.  Constitutional:      Appearance: Normal appearance. She is well-developed.  HENT:     Head: Normocephalic and atraumatic.     Nose: Nose normal.     Mouth/Throat:     Mouth: Mucous membranes are moist.     Pharynx: Oropharynx is clear.  Eyes:     Extraocular Movements: Extraocular movements intact.     Conjunctiva/sclera: Conjunctivae normal.     Pupils: Pupils are equal, round, and reactive to light.  Neck:     Vascular: No carotid bruit.  Cardiovascular:     Rate and Rhythm: Normal rate and regular rhythm.     Pulses: Normal pulses.     Heart sounds: Murmur heard.  Pulmonary:     Effort: Pulmonary effort is normal.     Breath sounds: Normal breath sounds.  Abdominal:     General: Bowel sounds are normal. There is no distension.     Palpations: Abdomen is soft. There is no mass.     Tenderness: There is no abdominal tenderness. There is no right CVA tenderness, left CVA tenderness, guarding or rebound.     Hernia: No hernia is present.  Musculoskeletal:        General: Normal range of motion.     Cervical back: Normal range of motion and neck supple.  Lymphadenopathy:     Cervical: No cervical adenopathy.  Skin:    General: Skin is warm and dry.      Capillary Refill: Capillary refill takes less than 2 seconds.  Neurological:     General: No focal deficit present.     Mental Status: She is alert and oriented to person, place, and time.  Psychiatric:        Mood and Affect: Mood normal.        Behavior: Behavior normal.        Thought Content: Thought content normal.        Judgment: Judgment normal.      Assessment & Plan    1. Essential hypertension Stable. Continue bp medication as prescribed  2. Urge incontinence Renew ditropan XL. Take 10 mg twice daily.  - oxybutynin (DITROPAN-XL) 10 MG 24 hr tablet; Take 1 tablet (10 mg total) by mouth 2 (two) times daily.  Dispense: 180 tablet; Refill: 1  3. Severe aortic stenosis Continue regular visits with cardiology as scheduled.     Problem List Items Addressed This Visit       Cardiovascular and Mediastinum   Essential hypertension - Primary   Severe aortic stenosis     Other   Urge incontinence   Relevant Medications   oxybutynin (DITROPAN-XL) 10 MG 24 hr tablet     Return in about 6 months (around 11/15/2022) for medicare wellness, FBW a week prior to visit.         Ronnell Freshwater, NP  [facility] at [facility] [phone] (phone) [phone] (fax)  [place]

## 2022-06-18 ENCOUNTER — Other Ambulatory Visit: Payer: Self-pay | Admitting: Cardiovascular Disease

## 2022-07-11 ENCOUNTER — Encounter: Payer: Self-pay | Admitting: Cardiovascular Disease

## 2022-07-11 ENCOUNTER — Ambulatory Visit: Payer: Medicare Other | Attending: Cardiovascular Disease | Admitting: Cardiovascular Disease

## 2022-07-11 VITALS — BP 160/80 | HR 70 | Ht 61.25 in | Wt 186.0 lb

## 2022-07-11 DIAGNOSIS — E7800 Pure hypercholesterolemia, unspecified: Secondary | ICD-10-CM

## 2022-07-11 DIAGNOSIS — I10 Essential (primary) hypertension: Secondary | ICD-10-CM | POA: Diagnosis not present

## 2022-07-11 DIAGNOSIS — Z952 Presence of prosthetic heart valve: Secondary | ICD-10-CM

## 2022-07-11 NOTE — Patient Instructions (Signed)
Medication Instructions:  No changes *If you need a refill on your cardiac medications before your next appointment, please call your pharmacy*  Testing/Procedures: Your physician has requested that you have an echocardiogram in July. Echocardiography is a painless test that uses sound waves to create images of your heart. It provides your doctor with information about the size and shape of your heart and how well your heart's chambers and valves are working. This procedure takes approximately one hour. There are no restrictions for this procedure. Please do NOT wear cologne, perfume, aftershave, or lotions (deodorant is allowed). Please arrive 15 minutes prior to your appointment time.    Follow-Up: At [facility], you and your health needs are our priority.  As part of our continuing mission to provide you with exceptional heart care, we have created designated Provider Care Teams.  These Care Teams include your primary Cardiologist (physician) and Advanced Practice Providers (APPs -  Physician Assistants and Nurse Practitioners) who all work together to provide you with the care you need, when you need it.  We recommend signing up for the patient portal called "MyChart".  Sign up information is provided on this After Visit Summary.  MyChart is used to connect with patients for Virtual Visits (Telemedicine).  Patients are able to view lab/test results, encounter notes, upcoming appointments, etc.  Non-urgent messages can be sent to your provider as well.   To learn more about what you can do with MyChart, go to [email or web address].    Your next appointment:   1 year(s)  Provider:   Thurmon Fair, MD

## 2022-07-11 NOTE — Progress Notes (Signed)
Cardiology Office Note:    Date:  07/11/2022   ID:  Sierra Carpenter 1945/10/30, MRN [id number]  PCP:  Carlean Jews, NP   [facility] Providers Cardiologist:  Thurmon Fair, MD     Referring MD: Carlean Jews, NP   Chief Complaint  Patient presents with   Cardiac Valve Problem    History of Present Illness:    Sierra Carpenter is a 77 y.o. 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    She feels well but is disappointed because she is gaining a lot of weight.  She has been through an emotional roller coaster after her grandson was involved in a severe motor vehicle accident.  His girlfriend was killed in the accident and he has required multiple surgeries for multiple injuries.  He is doing better now and has left the hospital, still requiring a lot of physical therapy.  The patient specifically denies any chest pain at rest or with exertion, dyspnea at rest or with exertion, orthopnea, paroxysmal nocturnal dyspnea, syncope, palpitations, focal neurological deficits, intermittent claudication, lower extremity edema, unexplained weight gain, cough, hemoptysis or wheezing.  Just has a variety of joint aches and pains.  Her blood pressure is a little higher, probably because of the weight gain.  Typically at home she has recorded 138-140/60-68 mmHg.  A year ago her blood pressure was in the 110-120 range.Today in the office her blood pressure was higher than this, but she has not yet taken her blood pressure medications.  She calls her prosthetic valve "Elsie".    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 Excell Seltzer and Dr. Laneta Simmers   Severe aortic stenosis    s/p TAVR 11/17/20    Past Surgical History:  Procedure Laterality Date   ABDOMINAL HYSTERECTOMY     fibroids; ovaries intact.   APPENDECTOMY     BILATERAL CARPAL TUNNEL RELEASE Bilateral    CARDIAC CATHETERIZATION     moderate stenosis in 1st diagonal & RCA   CESAREAN SECTION     CHOLECYSTECTOMY     EYE SURGERY Bilateral    cataracts   INNER EAR SURGERY Right    LITHOTRIPSY Right    RIGHT/LEFT HEART CATH AND CORONARY ANGIOGRAPHY N/A 10/21/2020   Procedure: RIGHT/LEFT HEART CATH AND CORONARY ANGIOGRAPHY;  Surgeon: Tonny Bollman, MD;  Location: [facility] INVASIVE CV LAB;  Service: Cardiovascular;  Laterality: N/A;   TEE WITHOUT CARDIOVERSION N/A 11/17/2020   Procedure: TRANSESOPHAGEAL ECHOCARDIOGRAM (TEE);  Surgeon: Tonny Bollman, MD;  Location: [facility] OR;  Service: Open Heart Surgery;  Laterality: N/A;   TRANSCATHETER AORTIC VALVE REPLACEMENT, TRANSFEMORAL N/A 11/17/2020   Procedure: TRANSCATHETER AORTIC VALVE REPLACEMENT, TRANSFEMORAL;  Surgeon: Tonny Bollman, MD;  Location: [facility] OR;  Service: Open Heart Surgery;  Laterality: N/A;   TRANSTHORACIC ECHOCARDIOGRAM  07/12/2010   EF=>55% with normal systolic function; trace MR/TR/PR    Current Medications: Current Meds  Medication Sig   Ascorbic Acid (VITAMIN C) 1000 MG  tablet Take 1,000 mg by mouth in the morning and at bedtime. Morning & at lunch   aspirin EC 81 MG tablet Take 81 mg by mouth daily. Swallow whole.   atorvastatin (LIPITOR) 80 MG tablet Take 1 tablet by mouth once daily   Cholecalciferol (VITAMIN D3) 25 MCG (1000 UT) CAPS Take 1,000 Units by mouth 2 (two) times daily. Morning & at bedtime   Coenzyme Q10 (COQ10) 100 MG CAPS Take 100 mg by mouth in the morning and at bedtime.   fish oil-omega-3 fatty  acids 1000 MG capsule Take 1,000 mg by mouth 3 (three) times daily with meals.   meloxicam (MOBIC) 7.5 MG tablet Take 1 tablet (7.5 mg total) by mouth daily. (Patient taking differently: Take 7.5 mg by mouth daily. Pt takes 1/2 tablet every other day.)   oxybutynin (DITROPAN-XL) 10 MG 24 hr tablet Take 1 tablet (10 mg total) by mouth 2 (two) times daily.   valsartan (DIOVAN) 160 MG tablet Take 1 tablet (160 mg total) by mouth daily.     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      Children: 1 daughter; 1 son died of CHF age 6 pacemaker.  4 grandchildren.      Lives: alone      Employment: retired at age 11 from [employer].      Tobacco:  never       Alcohol:  never      Exercise: yes in 2019; walking 1-2 miles daily.  DDD lumbar limiting walking.      ADLs: independent with ADLs; drives.        Advanced Directives: none; has paper.  FULL CODE; no prolonged measures.  HCPOA: daughter/Misty Wenn-Maness.   Social Determinants of Health   Financial Resource Strain: Not on file  Food Insecurity: Not on file  Transportation Needs: Not on file  Physical Activity: Not on file  Stress: Not on file  Social Connections: Not on file     Family History: The patient's family history includes Alzheimer's disease in her mother; COPD in her brother;  Heart disease in her brother, brother, brother, paternal grandfather, and son; Heart disease (age of onset: 30) in her father. There is no history of Breast cancer.  ROS:   Please see the history of present illness.     All other systems reviewed and are negative.  EKGs/Labs/Other Studies Reviewed:    The following studies were reviewed today: Echo 12/16/20 IMPRESSIONS  1. Left ventricular ejection fraction, by estimation, is 55 to 60%. Left ventricular ejection fraction by 3D volume is 57 %. The left ventricle has normal function. The left ventricle has no regional wall motion abnormalities. Left ventricular diastolic  parameters are consistent with Grade I diastolic  dysfunction (impaired relaxation).  2. Right ventricular systolic function is normal. The right ventricular size is normal.  3. The mitral valve is normal in structure. No evidence of mitral valve regurgitation.  4. The aortic valve has been repaired/replaced. Aortic valve regurgitation is not visualized. No aortic stenosis is present.  5. Aortic dilatation noted. There is mild dilatation of the ascending aorta, measuring 40 mm.    EKG:  EKG is ordered today.  The ekg ordered today demonstrates normal sinus rhythm, left anterior fascicular block, otherwise normal tracing.  QTc 423 ms.  Recent Labs: 10/15/2021: ALT 21; BUN 19; Creatinine, Ser 1.09; Hemoglobin 16.4; Platelets 242; Potassium 4.4; Sodium 145  Recent Lipid Panel    Component Value Date/Time   CHOL 137 10/15/2021 1220   TRIG 116 10/15/2021 1220   HDL 39 (L) 10/15/2021 1220   CHOLHDL 3.5 10/15/2021 1220   CHOLHDL 3.2 11/18/2015 1111   VLDL 29 11/18/2015 1111   LDLCALC 77 10/15/2021 1220     Risk Assessment/Calculations:           Physical Exam:    VS:  BP (!) 160/80   Pulse 70   Ht 5' 1.25" (1.556 m)   Wt 186 lb (84.4 kg)   SpO2 97%   BMI 34.86 kg/m     Wt Readings from Last 3 Encounters:  07/11/22 186 lb (84.4 kg)  05/17/22 181 lb (82.1 kg)   10/15/21 176 lb 12.8 oz (80.2 kg)      General: Alert, oriented x3, no distress, moderately obese Head: no evidence of trauma, PERRL, EOMI, no exophtalmos or lid lag, no myxedema, no xanthelasma; normal ears, nose and oropharynx Neck: normal jugular venous pulsations and no hepatojugular reflux; brisk carotid pulses without delay and no carotid bruits Chest: clear to auscultation, no signs of consolidation by percussion or palpation, normal fremitus, symmetrical and full respiratory excursions Cardiovascular: normal position and quality of the apical impulse, regular rhythm, normal first and second heart sounds, 2/6 early peaking "snoring" aortic valve ejection murmur no diastolic murmurs, rubs or gallops Abdomen: no tenderness or distention, no masses by palpation, no abnormal pulsatility or arterial bruits, normal bowel sounds, no hepatosplenomegaly Extremities: no clubbing, cyanosis or edema; 2+ radial, ulnar and brachial pulses bilaterally; 2+ right femoral, posterior tibial and dorsalis pedis pulses; 2+ left femoral, posterior tibial and dorsalis pedis pulses; no subclavian or femoral bruits Neurological: grossly nonfocal Psych: Normal mood and affect   ASSESSMENT:    1. S/P TAVR (transcatheter aortic valve replacement)   2. Essential hypertension   3. Hypercholesterolemia     PLAN:    In order of problems listed above:  AS s/p TAVR: Doing well.  Asymptomatic.  Excellent clinical and hemodynamic result.  Repeat echo in July.  Reminded her about the importance of endocarditis prophylaxis (she is edentulous). HTN: No longer well-controlled, but her home blood pressure readings are still in acceptable range.  Asked her to keep a monitor of these and if her systolic blood pressure is consistently over 140 we will need to adjust her medications.  Otherwise advised weight loss HLP: Reasonably good lipid levels.  Acceptable considering the fact that she does not have any meaningful CAD.   HDL will only improve with weight loss and more exercise.           Medication Adjustments/Labs and Tests Ordered: Current medicines are reviewed at length with the patient today.  Concerns regarding medicines are outlined above.  Orders Placed This Encounter  Procedures  EKG 12-Lead   ECHOCARDIOGRAM COMPLETE   No orders of the defined types were placed in this encounter.   Patient Instructions  Medication Instructions:  No changes *If you need a refill on your cardiac medications before your next appointment, please call your pharmacy*  Testing/Procedures: Your physician has requested that you have an echocardiogram in July. Echocardiography is a painless test that uses sound waves to create images of your heart. It provides your doctor with information about the size and shape of your heart and how well your heart's chambers and valves are working. This procedure takes approximately one hour. There are no restrictions for this procedure. Please do NOT wear cologne, perfume, aftershave, or lotions (deodorant is allowed). Please arrive 15 minutes prior to your appointment time.    Follow-Up: At [facility], you and your health needs are our priority.  As part of our continuing mission to provide you with exceptional heart care, we have created designated Provider Care Teams.  These Care Teams include your primary Cardiologist (physician) and Advanced Practice Providers (APPs -  Physician Assistants and Nurse Practitioners) who all work together to provide you with the care you need, when you need it.  We recommend signing up for the patient portal called "MyChart".  Sign up information is provided on this After Visit Summary.  MyChart is used to connect with patients for Virtual Visits (Telemedicine).  Patients are able to view lab/test results, encounter notes, upcoming appointments, etc.  Non-urgent messages can be sent to your provider as well.   To learn more about what  you can do with MyChart, go to [email or web address].    Your next appointment:   1 year(s)  Provider:   Thurmon Fair, MD       Signed, Thurmon Fair, MD  07/11/2022 1:09 PM    [facility]

## 2022-08-17 ENCOUNTER — Encounter: Payer: Self-pay | Admitting: Family Medicine

## 2022-08-17 ENCOUNTER — Ambulatory Visit: Payer: Medicare Other | Admitting: Family Medicine

## 2022-08-17 VITALS — BP 152/85 | HR 76 | Resp 18 | Ht 61.25 in | Wt 185.0 lb

## 2022-08-17 DIAGNOSIS — H9191 Unspecified hearing loss, right ear: Secondary | ICD-10-CM

## 2022-08-17 DIAGNOSIS — R42 Dizziness and giddiness: Secondary | ICD-10-CM

## 2022-08-17 NOTE — Patient Instructions (Signed)
Stay hydrated.  Try decreasing to 1 cup of coffee a day to see if it helps with your symptoms.  Also avoid salt, caffeine, nicotine, chocolate, and alcohol.  If anything does change, please let me know right away.  Otherwise, I will see you back in about a month.

## 2022-08-17 NOTE — Progress Notes (Signed)
Acute Office Visit  Subjective:     Patient ID: Sierra Carpenter, female    DOB: Aug 21, 1945, 77 y.o.   MRN: [id number]  Chief Complaint  Patient presents with   Ear Fullness    Ear Fullness    Patient is in today for clogged right ear. Symptoms initially started yesterday.  She woke up feeling like her right ear was clogged as she could not hear out of it at all.  She has hearing loss already in her left ear, but typically is able to hear some things from her right ear.  In addition to the new right-sided hearing loss, she endorses an episode of vertigo that caused her to fall backward onto the bed when she got up this morning.  Since symptoms started yesterday, she endorses feeling like there are "bubbles" in her right ear while at other times she endorses a high-pitched ringing.  Denies fever, chills, fatigue, ear pain, ear discharge, congestion, chest pain, cough, headache.  ROS Negative unless otherwise noted in HPI    Objective:    BP (!) 152/85 (BP Location: Left Arm, Patient Position: Sitting, Cuff Size: Normal)   Pulse 76   Resp 18   Ht 5' 1.25" (1.556 m)   Wt 185 lb (83.9 kg)   SpO2 97%   BMI 34.67 kg/m   Physical Exam Constitutional:      General: She is not in acute distress.    Appearance: Normal appearance.  HENT:     Head: Normocephalic and atraumatic.     Right Ear: Ear canal and external ear normal. No drainage, swelling or tenderness. There is no impacted cerumen.     Left Ear: Tympanic membrane, ear canal and external ear normal. No drainage, swelling or tenderness. There is no impacted cerumen.     Ears:      Comments: Whitish discoloration of right tympanic membrane    Nose: Nose normal. No congestion or rhinorrhea.     Mouth/Throat:     Mouth: Mucous membranes are moist.     Pharynx: Oropharynx is clear. No oropharyngeal exudate or posterior oropharyngeal erythema.  Eyes:     Extraocular Movements: Extraocular movements intact.      Conjunctiva/sclera: Conjunctivae normal.     Pupils: Pupils are equal, round, and reactive to light.  Cardiovascular:     Rate and Rhythm: Normal rate and regular rhythm.     Pulses: Normal pulses.     Heart sounds: No murmur heard.    No friction rub. No gallop.  Pulmonary:     Effort: Pulmonary effort is normal. No respiratory distress.     Breath sounds: No wheezing, rhonchi or rales.  Musculoskeletal:     Cervical back: Normal range of motion.  Skin:    General: Skin is warm and dry.  Neurological:     General: No focal deficit present.     Mental Status: She is alert and oriented to person, place, and time. Mental status is at baseline.  Psychiatric:        Mood and Affect: Mood normal.        Thought Content: Thought content normal.        Judgment: Judgment normal.      Assessment & Plan:  Hearing loss of right ear, unspecified hearing loss type -     Ambulatory referral to ENT  Vertigo -     Ambulatory referral to ENT  With new onset right hearing loss, tinnitus, and vertigo, recommend referral  to ENT for further workup.  Considered possibility of Mn[place] disease, recommended trial of decreasing salt, caffeine, nicotine, chocolate, alcohol, unable to see ENT specialist.  Patient requested to return to Dr. Pollyann Kennedy.  Blood pressure slightly elevated 152/85, above goal of less than 130/80.  If blood pressure still elevated at next appointment, recommend considering increasing valsartan.  Return in about 4 weeks (around 09/14/2022) for follow-up for hearing loss and vertigo.  Melida Quitter, PA

## 2022-09-11 ENCOUNTER — Other Ambulatory Visit: Payer: Self-pay | Admitting: Cardiovascular Disease

## 2022-09-19 ENCOUNTER — Ambulatory Visit: Payer: Medicare Other | Admitting: Family Medicine

## 2022-09-19 ENCOUNTER — Other Ambulatory Visit: Payer: Self-pay | Admitting: Nurse Practitioner

## 2022-09-19 DIAGNOSIS — N3941 Urge incontinence: Secondary | ICD-10-CM

## 2022-09-19 MED ORDER — OXYBUTYNIN CHLORIDE ER 10 MG PO TB24: 10.0000 mg | ORAL_TABLET | Freq: Two times a day (BID) | ORAL | 1 refills | Status: DC

## 2022-09-26 ENCOUNTER — Other Ambulatory Visit: Payer: Self-pay | Admitting: Family Medicine

## 2022-09-26 DIAGNOSIS — Z1231 Encounter for screening mammogram for malignant neoplasm of breast: Secondary | ICD-10-CM

## 2022-10-12 ENCOUNTER — Ambulatory Visit (HOSPITAL_COMMUNITY): Payer: Medicare Other

## 2022-10-22 ENCOUNTER — Other Ambulatory Visit: Payer: Self-pay | Admitting: Cardiovascular Disease

## 2022-11-03 ENCOUNTER — Ambulatory Visit (HOSPITAL_COMMUNITY): Payer: Medicare Other | Attending: Cardiovascular Disease

## 2022-11-03 DIAGNOSIS — Z952 Presence of prosthetic heart valve: Secondary | ICD-10-CM | POA: Insufficient documentation

## 2022-11-03 LAB — ECHOCARDIOGRAM COMPLETE
AV Mean grad: 11 mmHg
AV Peak grad: 19.2 mmHg
Ao pk vel: 2.19 m/s
Area-P 1/2: 2.69 cm2
S' Lateral: 1.4 cm

## 2022-11-03 MED ORDER — PERFLUTREN LIPID MICROSPHERE
1.0000 mL | INTRAVENOUS | Status: AC | PRN
  Administered 2022-11-03: 2 mL via INTRAVENOUS

## 2022-11-07 ENCOUNTER — Other Ambulatory Visit: Payer: Self-pay

## 2022-11-07 ENCOUNTER — Other Ambulatory Visit: Payer: Medicare Other

## 2022-11-07 DIAGNOSIS — Z0000 Encounter for general adult medical examination without abnormal findings: Secondary | ICD-10-CM

## 2022-11-07 DIAGNOSIS — Z1329 Encounter for screening for other suspected endocrine disorder: Secondary | ICD-10-CM

## 2022-11-15 ENCOUNTER — Encounter: Payer: Medicare Other | Admitting: Nurse Practitioner

## 2022-11-15 ENCOUNTER — Ambulatory Visit (INDEPENDENT_AMBULATORY_CARE_PROVIDER_SITE_OTHER): Payer: Medicare Other

## 2022-11-15 VITALS — Ht 61.25 in | Wt 186.0 lb

## 2022-11-15 DIAGNOSIS — Z0000 Encounter for general adult medical examination without abnormal findings: Secondary | ICD-10-CM

## 2022-11-15 NOTE — Progress Notes (Signed)
Subjective:   Sierra Carpenter is a 77 y.o. female who presents for Medicare Annual (Subsequent) preventive examination.  Visit Complete: Virtual  I connected with  Sierra Carpenter on 11/15/22 by a audio enabled telemedicine application and verified that I am speaking with the correct person using two identifiers.  Patient Location: Home  Provider Location: Home Office  I discussed the limitations of evaluation and management by telemedicine. The patient expressed understanding and agreed to proceed.  Patient Medicare AWV questionnaire was completed by the patient on 11/08/22; I have confirmed that all information answered by patient is correct and no changes since this date.  Review of Systems    Vital Signs: Unable to obtain new vitals due to this being a telehealth visit.  Cardiac Risk Factors include: advanced age (>75men, >40 women);hypertension     Objective:    Today's Vitals   11/15/22 0820  Weight: 186 lb (84.4 kg)  Height: 5' 1.25" (1.556 m)   Body mass index is 34.86 kg/m.     11/15/2022    8:31 AM 11/17/2020    6:07 AM 11/13/2020    2:25 PM 10/29/2020   12:58 PM 10/21/2020    9:37 AM 09/09/2019    8:41 AM 08/12/2017    1:24 PM  Advanced Directives  Does Patient Have a Medical Advance Directive? Yes Yes Yes Yes Yes Yes Yes  Type of Estate agent of [place];Living will Living will;Healthcare Power of Attorney Living will;Healthcare Power of Attorney Living will;Healthcare Power of Attorney Living will;Healthcare Power of [employer] Power of [place];Living will Living will  Does patient want to make changes to medical advance directive? No - Patient declined   No - Patient declined No - Patient declined No - Patient declined No - Patient declined  Copy of Healthcare Power of Attorney in Chart? Yes - validated most recent copy scanned in chart (See row information) Yes - validated most recent copy scanned in chart (See row information) No - copy  requested No - copy requested No - copy requested No - copy requested     Current Medications (verified) Outpatient Encounter Medications as of 11/15/2022  Medication Sig   albuterol (VENTOLIN HFA) 108 (90 Base) MCG/ACT inhaler Inhale into the lungs as needed.   Ascorbic Acid (VITAMIN C) 1000 MG tablet Take 1,000 mg by mouth in the morning and at bedtime. Morning & at lunch   aspirin EC 81 MG tablet Take 81 mg by mouth daily. Swallow whole.   atorvastatin (LIPITOR) 80 MG tablet Take 1 tablet by mouth once daily   Cholecalciferol (VITAMIN D3) 25 MCG (1000 UT) CAPS Take 1,000 Units by mouth 2 (two) times daily. Morning & at bedtime   Coenzyme Q10 (COQ10) 100 MG CAPS Take 100 mg by mouth in the morning and at bedtime.   fish oil-omega-3 fatty acids 1000 MG capsule Take 1,000 mg by mouth 3 (three) times daily with meals.   meloxicam (MOBIC) 7.5 MG tablet Take 1 tablet (7.5 mg total) by mouth daily. (Patient taking differently: Take 7.5 mg by mouth daily. Pt takes 1/2 tablet every other day.)   oxybutynin (DITROPAN-XL) 10 MG 24 hr tablet Take 1 tablet (10 mg total) by mouth 2 (two) times daily.   valsartan (DIOVAN) 160 MG tablet Take 1 tablet by mouth once daily   No facility-administered encounter medications on file as of 11/15/2022.    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 Excell Seltzer and Dr. Laneta Simmers   Severe aortic stenosis    s/p TAVR 11/17/20   Past Surgical History:  Procedure Laterality Date   ABDOMINAL HYSTERECTOMY     fibroids; ovaries intact.   APPENDECTOMY      BILATERAL CARPAL TUNNEL RELEASE Bilateral    CARDIAC CATHETERIZATION     moderate stenosis in 1st diagonal & RCA   CESAREAN SECTION     CHOLECYSTECTOMY     EYE SURGERY Bilateral    cataracts   INNER EAR SURGERY Right    LITHOTRIPSY Right    RIGHT/LEFT HEART CATH AND CORONARY ANGIOGRAPHY N/A 10/21/2020   Procedure: RIGHT/LEFT HEART CATH AND CORONARY ANGIOGRAPHY;  Surgeon: Tonny Bollman, MD;  Location: [facility] INVASIVE CV LAB;  Service: Cardiovascular;  Laterality: N/A;   TEE WITHOUT CARDIOVERSION N/A 11/17/2020   Procedure: TRANSESOPHAGEAL ECHOCARDIOGRAM (TEE);  Surgeon: Tonny Bollman, MD;  Location: [facility] OR;  Service: Open Heart Surgery;  Laterality: N/A;   TRANSCATHETER AORTIC VALVE REPLACEMENT, TRANSFEMORAL N/A 11/17/2020   Procedure: TRANSCATHETER AORTIC VALVE REPLACEMENT, TRANSFEMORAL;  Surgeon: Tonny Bollman, MD;  Location[facility] OR;  Service: Open Heart Surgery;  Laterality: N/A;   TRANSTHORACIC ECHOCARDIOGRAM  07/12/2010   EF=>55% with normal systolic function; trace MR/TR/PR   Family History  Problem Relation Age of Onset   Heart disease Father 44       AMI   Heart disease Brother    Heart disease Son    Heart disease Paternal Grandfather    Alzheimer's disease Mother    Heart disease Brother        CHF/CAD   COPD Brother    Heart disease Brother    Breast cancer Neg Hx    Social History   Socioeconomic History   Marital status: Widowed    Spouse name: Not on file   Number of children: 1   Years of education: 61   Highest education level: 12th grade  Occupational History   Occupation: retired    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      Children: 1 daughter; 1 son died of CHF age 50 pacemaker.  4 grandchildren.      Lives: alone      Employment: retired at age 46 from [employer].      Tobacco:  never       Alcohol:  never      Exercise: yes in 2019; walking 1-2 miles daily.  DDD lumbar limiting walking.      ADLs: independent with ADLs; drives.        Advanced Directives: none; has paper.  FULL CODE; no prolonged measures.  HCPOA: daughter/Misty [place]enn-Man[place].   Social Determinants of Health   Financial Resource Strain: Low Risk  (11/15/2022)   Overall Financial Resource Strain (CARDIA)    Difficulty of Paying Living Expenses: Not hard at all  Food Insecurity: No Food Insecurity (11/15/2022)   Hunger Vital Sign    Worried About Running Out of Food in the Last Year: Never true    Ran Out of Food in the Last Year: Never true  Transportation Needs: No Transportation Needs (11/15/2022)   PRAPARE - Administrator, Civil Service (Medical): No    Lack of Transportation (Non-Medical): No  Physical Activity: Insufficiently Active (11/15/2022)   Exercise Vital Sign    Days of Exercise per Week: 1 day    Minutes of Exercise per Session: 30 min  Stress: No Stress Concern Present (11/15/2022)   [employer] of Occupational Health - Occupational Stress Questionnaire    Feeling of Stress : Not at all  Social Connections: Moderately Integrated (11/15/2022)   Social Connection and Isolation Panel [NHANES]    Frequency of Communication with Friends and Family: More than three times a week    Frequency of Social Gatherings with Friends and Family: More than three times a week    Attends Religious Services: More than 4 times per year    Active Member of [employer] or Organizations: Yes    Attends Banker Meetings: More than 4 times per year    Marital Status: Widowed    Tobacco Counseling Counseling given: Not  Answered   Clinical Intake:  Pre-visit preparation completed: Yes  Pain : No/denies pain     BMI - recorded: 34.86 Nutritional Status: BMI > 30  Obese Nutritional Risks: None Diabetes: No  How often do you need to have someone help you when you read instructions, pamphlets, or other written materials from your doctor or pharmacy?: 1 - Never  Interpreter Needed?: No  Information entered by :: Theresa Mulligan LPN   Activities of Daily Living    11/15/2022    8:28 AM 11/08/2022    3:26 AM  In your present state of health, do you have any difficulty performing the following activities:  Hearing? 1 1  Comment Wears hearing aids   Vision? 0 0  Difficulty concentrating or making decisions? 0 0  Walking or climbing stairs? 1 1  Dressing or bathing? 0 0  Doing errands, shopping? 0 0  Preparing Food and eating ? N N  Using the Toilet? N N  In the past six months, have you accidently leaked urine? Malvin Johns  Comment Wears depends. Followed by PCP   Do you have problems with loss of bowel control? N N  Managing your Medications? N N  Managing your Finances? N N  Housekeeping or managing your Housekeeping? N N    Patient Care Team: Sandre Kitty, MD as PCP - General (Family Medicine) Croitoru, Rachelle Hora, MD as PCP - Cardiology (Cardiology) Thurmon Fair, MD as Consulting Physician (Cardiology) Dione Booze, Bertram Millard, MD as Consulting Physician (Ophthalmology)  Indicate any recent Medical Services you may have received from other than [facility] providers in the past year (date may be approximate).     Assessment:   This is a routine wellness examination for Sierra Carpenter.  Hearing/Vision screen Hearing Screening - Comments:: Wears hearing aids Vision Screening - Comments:: Wears rx glasses - up to date with routine eye exams with  Dr Dione Booze  Dietary issues and exercise activities discussed:     Goals  Addressed               This Visit's Progress     Increase physical activity  (pt-stated)        Lose weight       Depression Screen    05/17/2022   10:15 AM 09/29/2021    9:56 AM 03/30/2021    9:56 AM 12/15/2020   10:01 AM 09/30/2020    9:53 AM 06/30/2020   10:40 AM 04/09/2020    3:38 PM  PHQ 2/9 Scores  PHQ - 2 Score 2 1 0 0 0 0 0  PHQ- 9 Score 2 2 1  0 0 0     Fall Risk    11/15/2022    8:30 AM 11/08/2022    3:26 AM 05/17/2022   10:15 AM 03/30/2021    9:56 AM 12/15/2020   10:01 AM  Fall Risk   Falls in the past year? 0 0 0 1 0  Number falls in past yr: 0 0 0 1 0  Injury with Fall? 0 0 0 0 0  Risk for fall due to : No Fall Risks      Follow up Falls prevention discussed  Falls evaluation completed Falls evaluation completed Falls evaluation completed    MEDICARE RISK AT HOME:  Medicare Risk at Home - 11/15/22 0836     Any stairs in or around the home? Yes    If so, are there any without handrails? No    Home free of loose throw rugs in walkways, pet beds, electrical cords, etc? Yes    Adequate lighting in your home to reduce risk of falls? Yes    Life alert? No    Use of a cane, walker or w/c? Yes    Grab bars in the bathroom? Yes    Shower chair or bench in shower? Yes    Elevated toilet seat or a handicapped toilet? No             TIMED UP AND GO:  Was the test performed?  No    Cognitive Function:        11/15/2022    8:31 AM 09/29/2021    8:26 AM 09/30/2020    9:42 AM 09/09/2019    8:35 AM 09/06/2018    3:13 PM  6CIT Screen  What Year? 0 points 0 points 0 points 0 points 0 points  What month? 0 points 0 points 0 points 0 points 0 points  What time? 0 points 0 points 0 points 0 points 0 points  Count back from 20 0 points 0 points 0 points 0 points 0 points  Months in reverse 0 points 0 points 0 points 0 points 0 points  Repeat phrase 0 points 0 points 0 points 0 points 0 points  Total Score 0 points 0 points 0 points 0 points 0 points    Immunizations Immunization History  Administered Date(s) Administered   Influenza Split  12/08/2012   Influenza, High Dose Seasonal PF 11/23/2018, 12/03/2020   Influenza, Quadrivalent, Recombinant, Inj, Pf 11/22/2017, 12/03/2020   Influenza,inj,Quad PF,6+ Mos 12/18/2014, 11/29/2016   Influenza-Unspecified 12/10/2013, 11/30/2015, 06/01/2016, 11/22/2017, 11/23/2018, 11/22/2019   PFIZER(Purple Top)SARS-COV-2 Vaccination 05/30/2019, 06/25/2019, 06/25/2019, 01/28/2020, 06/26/2020   PNEUMOCOCCAL CONJUGATE-20 12/03/2020   Pneumococcal Conjugate-13 12/10/2013   Pneumococcal Polysaccharide-23 12/18/2014   Tdap 08/07/2010, 06/26/2020   Zoster Recombinant(Shingrix) 08/27/2016    TDAP status: Up to date  Flu Vaccine status: Due, Education has been provided regarding the importance of this vaccine.  Advised may receive this vaccine at local pharmacy or Health Dept. Aware to provide a copy of the vaccination record if obtained from local pharmacy or Health Dept. Verbalized acceptance and understanding.  Pneumococcal vaccine status: Up to date  Covid-19 vaccine status: Completed vaccines  Qualifies for Shingles Vaccine? Yes   Zostavax completed Yes   Shingrix Completed?: Yes  Screening Tests Health Maintenance  Topic Date Due   Zoster Vaccines- Shingrix (2 of 2) 10/22/2016   COVID-19 Vaccine (6 - 2023-24 season) 12/03/2021   INFLUENZA VACCINE  11/03/2022   Medicare Annual Wellness (AWV)  11/15/2023   MAMMOGRAM  11/26/2023   Fecal DNA (Cologuard)  12/24/2023   DTaP/Tdap/Td (3 - Td or Tdap) 06/27/2030   Pneumonia Vaccine 60+ Years old  Completed   DEXA SCAN  Completed   Hepatitis C Screening  Completed   HPV VACCINES  Aged Out   Colonoscopy  Discontinued    Health Maintenance  Health Maintenance Due  Topic Date Due   Zoster Vaccines- Shingrix (2 of 2) 10/22/2016   COVID-19 Vaccine (6 - 2023-24 season) 12/03/2021   INFLUENZA VACCINE  11/03/2022    Colorectal cancer screening: Type of screening: Cologuard. Completed 12/23/20. Repeat every 3 years  Mammogram status:  Completed 12/06/22. Repeat every year  Bone Density status: Completed 10/21/21. Results reflect: Bone density results: NORMAL. Repeat every   years.  Lung Cancer Screening: (Low Dose CT Chest recommended if Age 24-80 years, 20 pack-year currently smoking OR have quit w/in 15years.) does not qualify.     Additional Screening:  Hepatitis C Screening: does qualify; Completed 11/18/15  Vision Screening: Recommended annual ophthalmology exams for early detection of glaucoma and other disorders of the eye. Is the patient up to date with their annual eye exam?  Yes  Who is the provider or what is the name of the office in which the patient attends annual eye exams? Dr Dione Booze If pt is not established with a provider, would they like to be referred to a provider to establish care? No .   Dental Screening: Recommended annual dental exams for proper oral hygiene    Community Resource Referral / Chronic Care Management:  CRR required this visit?  No   CCM required this visit?  No     Plan:     I have personally reviewed and noted the following in the patient's chart:   Medical and social history Use of alcohol, tobacco or illicit drugs  Current medications and supplements including opioid prescriptions. Patient is not currently taking opioid prescriptions. Functional ability and status Nutritional status Physical activity Advanced directives List of other physicians Hospitalizations, surgeries, and ER visits in previous 12 months Vitals Screenings to include cognitive, depression, and falls Referrals and appointments  In addition, I have reviewed and discussed with patient certain preventive protocols, quality metrics, and best practice recommendations. A written personalized care plan for preventive services as well as general preventive health recommendations were provided to patient.     Tillie Rung, LPN   12/17/7827   After Visit Summary: (MyChart) Due to this being a  telephonic visit, the after visit summary with patients personalized plan was offered to patient via MyChart   Nurse Notes: None

## 2022-11-15 NOTE — Patient Instructions (Addendum)
Sierra Carpenter , Thank you for taking time to come for your Medicare Wellness Visit. I appreciate your ongoing commitment to your health goals. Please review the following plan we discussed and let me know if I can assist you in the future.   Referrals/Orders/Follow-Ups/Clinician Recommendations:   This is a list of the screening recommended for you and due dates:  Health Maintenance  Topic Date Due   Zoster (Shingles) Vaccine (2 of 2) 10/22/2016   COVID-19 Vaccine (6 - 2023-24 season) 12/03/2021   Flu Shot  11/03/2022   Medicare Annual Wellness Visit  11/15/2023   Mammogram  11/26/2023   Cologuard (Stool DNA test)  12/24/2023   DTaP/Tdap/Td vaccine (3 - Td or Tdap) 06/27/2030   Pneumonia Vaccine  Completed   DEXA scan (bone density measurement)  Completed   Hepatitis C Screening  Completed   HPV Vaccine  Aged Out   Colon Cancer Screening  Discontinued    Advanced directives: (In Chart) A copy of your advanced directives are scanned into your chart should your provider ever need it.  Next Medicare Annual Wellness Visit scheduled for next year: Yes  Preventive Care 31 Years and Older, Female Preventive care refers to lifestyle choices and visits with your health care provider that can promote health and wellness. What does preventive care include? A yearly physical exam. This is also called an annual well check. Dental exams once or twice a year. Routine eye exams. Ask your health care provider how often you should have your eyes checked. Personal lifestyle choices, including: Daily care of your teeth and gums. Regular physical activity. Eating a healthy diet. Avoiding tobacco and drug use. Limiting alcohol use. Practicing safe sex. Taking low-dose aspirin every day. Taking vitamin and mineral supplements as recommended by your health care provider. What happens during an annual well check? The services and screenings done by your health care provider during your annual well  check will depend on your age, overall health, lifestyle risk factors, and family history of disease. Counseling  Your health care provider may ask you questions about your: Alcohol use. Tobacco use. Drug use. Emotional well-being. Home and relationship well-being. Sexual activity. Eating habits. History of falls. Memory and ability to understand (cognition). Work and work Astronomer. Reproductive health. Screening  You may have the following tests or measurements: Height, weight, and BMI. Blood pressure. Lipid and cholesterol levels. These may be checked every 5 years, or more frequently if you are over 63 years old. Skin check. Lung cancer screening. You may have this screening every year starting at age 20 if you have a 30-pack-year history of smoking and currently smoke or have quit within the past 15 years. Fecal occult blood test (FOBT) of the stool. You may have this test every year starting at age 80. Flexible sigmoidoscopy or colonoscopy. You may have a sigmoidoscopy every 5 years or a colonoscopy every 10 years starting at age 1. Hepatitis C blood test. Hepatitis B blood test. Sexually transmitted disease (STD) testing. Diabetes screening. This is done by checking your blood sugar (glucose) after you have not eaten for a while (fasting). You may have this done every 1-3 years. Bone density scan. This is done to screen for osteoporosis. You may have this done starting at age 95. Mammogram. This may be done every 1-2 years. Talk to your health care provider about how often you should have regular mammograms. Talk with your health care provider about your test results, treatment options, and if necessary, the  need for more tests. Vaccines  Your health care provider may recommend certain vaccines, such as: Influenza vaccine. This is recommended every year. Tetanus, diphtheria, and acellular pertussis (Tdap, Td) vaccine. You may need a Td booster every 10 years. Zoster  vaccine. You may need this after age 72. Pneumococcal 13-valent conjugate (PCV13) vaccine. One dose is recommended after age 31. Pneumococcal polysaccharide (PPSV23) vaccine. One dose is recommended after age 73. Talk to your health care provider about which screenings and vaccines you need and how often you need them. This information is not intended to replace advice given to you by your health care provider. Make sure you discuss any questions you have with your health care provider. Document Released: 04/17/2015 Document Revised: 12/09/2015 Document Reviewed: 01/20/2015 Elsevier Interactive Patient Education  2017 [employer].  Fall Prevention in the Home Falls can cause injuries. They can happen to people of all ages. There are many things you can do to make your home safe and to help prevent falls. What can I do on the outside of my home? Regularly fix the edges of walkways and driveways and fix any cracks. Remove anything that might make you trip as you walk through a door, such as a raised step or threshold. Trim any bushes or trees on the path to your home. Use bright outdoor lighting. Clear any walking paths of anything that might make someone trip, such as rocks or tools. Regularly check to see if handrails are loose or broken. Make sure that both sides of any steps have handrails. Any raised decks and porches should have guardrails on the edges. Have any leaves, snow, or ice cleared regularly. Use sand or salt on walking paths during winter. Clean up any spills in your garage right away. This includes oil or grease spills. What can I do in the bathroom? Use night lights. Install grab bars by the toilet and in the tub and shower. Do not use towel bars as grab bars. Use non-skid mats or decals in the tub or shower. If you need to sit down in the shower, use a plastic, non-slip stool. Keep the floor dry. Clean up any water that spills on the floor as soon as it happens. Remove  soap buildup in the tub or shower regularly. Attach bath mats securely with double-sided non-slip rug tape. Do not have throw rugs and other things on the floor that can make you trip. What can I do in the bedroom? Use night lights. Make sure that you have a light by your bed that is easy to reach. Do not use any sheets or blankets that are too big for your bed. They should not hang down onto the floor. Have a firm chair that has side arms. You can use this for support while you get dressed. Do not have throw rugs and other things on the floor that can make you trip. What can I do in the kitchen? Clean up any spills right away. Avoid walking on wet floors. Keep items that you use a lot in easy-to-reach places. If you need to reach something above you, use a strong step stool that has a grab bar. Keep electrical cords out of the way. Do not use floor polish or wax that makes floors slippery. If you must use wax, use non-skid floor wax. Do not have throw rugs and other things on the floor that can make you trip. What can I do with my stairs? Do not leave any items on the  stairs. Make sure that there are handrails on both sides of the stairs and use them. Fix handrails that are broken or loose. Make sure that handrails are as long as the stairways. Check any carpeting to make sure that it is firmly attached to the stairs. Fix any carpet that is loose or worn. Avoid having throw rugs at the top or bottom of the stairs. If you do have throw rugs, attach them to the floor with carpet tape. Make sure that you have a light switch at the top of the stairs and the bottom of the stairs. If you do not have them, ask someone to add them for you. What else can I do to help prevent falls? Wear shoes that: Do not have high heels. Have rubber bottoms. Are comfortable and fit you well. Are closed at the toe. Do not wear sandals. If you use a stepladder: Make sure that it is fully opened. Do not climb a  closed stepladder. Make sure that both sides of the stepladder are locked into place. Ask someone to hold it for you, if possible. Clearly mark and make sure that you can see: Any grab bars or handrails. First and last steps. Where the edge of each step is. Use tools that help you move around (mobility aids) if they are needed. These include: Canes. Walkers. Scooters. Crutches. Turn on the lights when you go into a dark area. Replace any light bulbs as soon as they burn out. Set up your furniture so you have a clear path. Avoid moving your furniture around. If any of your floors are uneven, fix them. If there are any pets around you, be aware of where they are. Review your medicines with your doctor. Some medicines can make you feel dizzy. This can increase your chance of falling. Ask your doctor what other things that you can do to help prevent falls. This information is not intended to replace advice given to you by your health care provider. Make sure you discuss any questions you have with your health care provider. Document Released: 01/15/2009 Document Revised: 08/27/2015 Document Reviewed: 04/25/2014 Elsevier Interactive Patient Education  2017 [employer].

## 2022-11-16 ENCOUNTER — Encounter: Payer: Self-pay | Admitting: Family Medicine

## 2022-11-16 ENCOUNTER — Ambulatory Visit: Payer: Medicare Other | Admitting: Family Medicine

## 2022-11-16 VITALS — BP 150/80 | HR 96 | Ht 61.25 in | Wt 190.8 lb

## 2022-11-16 DIAGNOSIS — M545 Low back pain, unspecified: Secondary | ICD-10-CM | POA: Diagnosis not present

## 2022-11-16 DIAGNOSIS — I10 Essential (primary) hypertension: Secondary | ICD-10-CM

## 2022-11-16 DIAGNOSIS — G8929 Other chronic pain: Secondary | ICD-10-CM | POA: Diagnosis not present

## 2022-11-16 DIAGNOSIS — N3941 Urge incontinence: Secondary | ICD-10-CM

## 2022-11-16 MED ORDER — AMLODIPINE BESYLATE 5 MG PO TABS: 5.0000 mg | ORAL_TABLET | Freq: Every day | ORAL | 3 refills | Status: DC

## 2022-11-16 MED ORDER — DICLOFENAC SODIUM 1 % EX GEL: 2.0000 g | Freq: Four times a day (QID) | CUTANEOUS | 3 refills | Status: DC

## 2022-11-16 NOTE — Assessment & Plan Note (Signed)
Bp elevated today.  Adding amlodipine 5mg .  Recheck in 1 month.

## 2022-11-16 NOTE — Patient Instructions (Signed)
It was nice to see you today,  We addressed the following topics today: - I will send in a referral for a neurologist to treat your urinary complaints - It is okay to take a full tablet of your Mobic every other day.  On days when you do not take it you can take Voltaren gel.  You can rub this on the area that hurts up to 4 times a day. - I will send in a prescription for a new medication for your blood pressure.  I would like you to follow-up in 1 month.  Have a great day,  Frederic Jericho, MD

## 2022-11-16 NOTE — Assessment & Plan Note (Signed)
Nocturia, symptoms of urge incontinence during the day.  Takes oxybutynin but this is only partially effective. - Refer to urology.

## 2022-11-16 NOTE — Progress Notes (Signed)
   Established Patient Office Visit  Subjective   Patient ID: Sierra Carpenter, female    DOB: 1946/02/18  Age: 77 y.o. MRN: [id number]  Chief Complaint  Patient presents with   Medical Management of Chronic Issues    HPI   Patient takes oxybutynin for her nocturia/urge incontinence.  She has never been referred to urology.  She is okay with a referral.  She feels like the oxybutynin is only partially working.  She wears a depends to bed at night and still has nighttime urination.  Sometimes has trouble making it to the bathroom in time during the day.  Hypertension-patient takes her valsartan.  She is okay with starting another medication.  Sometimes she says she does not take the valsartan because she just feels too bad and feels like the medication makes her feel worse.  Does take it on most days.  Patient is going to have "ear surgery" with her ENT upcoming soon.  This is for right-sided hearing loss.  Patient takes meloxicam 7.5 mg, half a tablet every other day for back pain.  Sometimes she takes a full tablet when her back pain is worse.  She wants to know if she can take a full tablet every other day.  We discussed long-term NSAID use.  Stated this would be okay and she can use Voltaren gel on the other days when she is not taking meloxicam.   The 10-year ASCVD risk score (Arnett DK, et al., 2019) is: 24.5%  Health Maintenance Due  Topic Date Due   COVID-19 Vaccine (6 - 2023-24 season) 12/03/2021   INFLUENZA VACCINE  11/03/2022      Objective:     BP (!) 150/80   Pulse 96   Ht 5' 1.25" (1.556 m)   Wt 190 lb 12.8 oz (86.5 kg)   SpO2 95%   BMI 35.76 kg/m    Physical Exam General: Alert, oriented CV: Regular rate and rhythm Psych: Pleasant affect  No results found for any visits on 11/16/22.      Assessment & Plan:   Urge incontinence Assessment & Plan: Nocturia, symptoms of urge incontinence during the day.  Takes oxybutynin but this is only partially  effective. - Refer to urology.  Orders: -     Ambulatory referral to Urology  Essential hypertension Assessment & Plan: Bp elevated today.  Adding amlodipine 5mg .  Recheck in 1 month.     Chronic bilateral low back pain without sciatica Assessment & Plan: Takes 1/2 of 7.5mg  mobic eod.  Advised pt she can take whole tablet eod.  Advised she can use voltaren gel on other days as well.     Other orders -     Diclofenac Sodium; Apply 2 g topically 4 (four) times daily. On days when not taking mobic  Dispense: 50 g; Refill: 3 -     amLODIPine Besylate; Take 1 tablet (5 mg total) by mouth daily.  Dispense: 90 tablet; Refill: 3     Return in about 4 weeks (around 12/14/2022) for HTN.    Sandre Kitty, MD

## 2022-11-16 NOTE — Assessment & Plan Note (Signed)
Takes 1/2 of 7.5mg  mobic eod.  Advised pt she can take whole tablet eod.  Advised she can use voltaren gel on other days as well.

## 2022-11-21 ENCOUNTER — Other Ambulatory Visit (HOSPITAL_COMMUNITY): Payer: Self-pay | Admitting: Emergency Medicine

## 2022-11-21 DIAGNOSIS — Z952 Presence of prosthetic heart valve: Secondary | ICD-10-CM

## 2022-11-21 NOTE — Progress Notes (Signed)
Repeat Echo orders placed

## 2022-12-06 ENCOUNTER — Ambulatory Visit
Admission: RE | Admit: 2022-12-06 | Discharge: 2022-12-06 | Disposition: A | Discharge status: Home / Self Care | Payer: Medicare Other | Source: Ambulatory Visit | Attending: Family Medicine | Admitting: Family Medicine

## 2022-12-06 DIAGNOSIS — Z1231 Encounter for screening mammogram for malignant neoplasm of breast: Secondary | ICD-10-CM

## 2022-12-14 ENCOUNTER — Ambulatory Visit: Payer: Medicare Other | Admitting: Family Medicine

## 2022-12-14 ENCOUNTER — Encounter: Payer: Self-pay | Admitting: Family Medicine

## 2022-12-14 VITALS — BP 148/72 | HR 76 | Ht 61.25 in | Wt 186.0 lb

## 2022-12-14 DIAGNOSIS — I10 Essential (primary) hypertension: Secondary | ICD-10-CM

## 2022-12-14 DIAGNOSIS — N3941 Urge incontinence: Secondary | ICD-10-CM | POA: Diagnosis not present

## 2022-12-14 DIAGNOSIS — M545 Low back pain, unspecified: Secondary | ICD-10-CM | POA: Diagnosis not present

## 2022-12-14 DIAGNOSIS — G8929 Other chronic pain: Secondary | ICD-10-CM

## 2022-12-14 MED ORDER — AMLODIPINE BESYLATE-VALSARTAN 5-160 MG PO TABS: 1.0000 | ORAL_TABLET | Freq: Every day | ORAL | 3 refills | Status: DC

## 2022-12-14 NOTE — Patient Instructions (Addendum)
It was nice to see you today,  We addressed the following topics today: -I have changed your amlodipine and valsartan to a combination pill that includes both of them.  Instead of taking amlodipine and valsartan separately you will take a new pill called Exforge.  You still take it once a day. - Follow-up with me in 3 months - Continue to check your blood pressure regularly and bring the results to [place] next time you see me.  Have a great day,  Frederic Jericho, MD

## 2022-12-14 NOTE — Progress Notes (Unsigned)
   Established Patient Office Visit  Subjective   Patient ID: Sierra Carpenter, female    DOB: 1945-04-12  Age: 77 y.o. MRN: [id number]  No chief complaint on file.   HPI  Hypertension-patient has been compliant with her amlodipine.  No issues with taking it.  No side effects.  Has also been taking her valsartan more regularly.  She takes her blood pressure about twice a day and provided some recent values which were all in the 130s region systolic over 60s diastolic.  Patient has a appointment with the urologist for  incontinence in October.  Patient has a appointment with the otolaryngologist in October.  Patient has been taking a full dose of her meloxicam every other day and feels like this is helping much better than the half dose she was taking previously.  The 10-year ASCVD risk score (Arnett DK, et al., 2019) is: 24.5%  Health Maintenance Due  Topic Date Due   COVID-19 Vaccine (6 - 2023-24 season) 12/04/2022      Objective:     There were no vitals taken for this visit. {Vitals History (Optional):23777}  Physical Exam General: Alert and oriented CV: Regular rate and rhythm Pulmonary: Lungs clear bilaterally. Psych: Pleasant affect   No results found for any visits on 12/14/22.      Assessment & Plan:   There are no diagnoses linked to this encounter.   No follow-ups on file.    Sandre Kitty, MD

## 2022-12-15 NOTE — Assessment & Plan Note (Signed)
Patient has initial appointment with her urologist in October.

## 2022-12-15 NOTE — Assessment & Plan Note (Signed)
Patient's blood pressure elevated today.  Patient states that his typically in the 130s over 60s at home. - Will switch patient's amlodipine and valsartan to a combination pill for improved compliance - Record home values and discuss with the patient at her next visit in 1 month.

## 2022-12-15 NOTE — Assessment & Plan Note (Signed)
Patient taking 7.5 mg every other day instead of the half a tablet.  Noticed improved pain control with this.  Continue current treatment.

## 2022-12-29 ENCOUNTER — Other Ambulatory Visit: Payer: Self-pay | Admitting: Otolaryngology

## 2022-12-29 DIAGNOSIS — H903 Sensorineural hearing loss, bilateral: Secondary | ICD-10-CM

## 2022-12-29 DIAGNOSIS — H9120 Sudden idiopathic hearing loss, unspecified ear: Secondary | ICD-10-CM

## 2023-01-16 ENCOUNTER — Encounter: Payer: Self-pay | Admitting: Urology

## 2023-01-16 ENCOUNTER — Ambulatory Visit: Payer: Medicare Other | Admitting: Urology

## 2023-01-16 VITALS — BP 180/82 | HR 86

## 2023-01-16 DIAGNOSIS — N3941 Urge incontinence: Secondary | ICD-10-CM

## 2023-01-16 NOTE — Progress Notes (Signed)
01/16/2023 1:49 PM   Sierra Carpenter Nov 28, 1945 [id number]  Referring provider: Sandre Kitty, MD [address] [place],  [place] [address]  No chief complaint on file.   HPI: I was consulted to assess the patient's urinary incontinence.  She primarily has foot on the floor syndrome first in the morning but not every morning.  Sometimes she has urge incontinence.  No bedwetting.  Rare to no stress incontinence.  Generally only wears a pad for confidence when she travels away from the house  She voids every 3 hours and has no nocturia.  She is on oxybutynin extended release 10 mg twice a day for 5 years  No bladder surgery.  Has had kidney stones.  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 Excell Seltzer and Dr. Laneta Simmers   Severe aortic stenosis    s/p TAVR 11/17/20    Surgical History: Past Surgical History:  Procedure Laterality Date   ABDOMINAL HYSTERECTOMY     fibroids; ovaries intact.   APPENDECTOMY     BILATERAL CARPAL TUNNEL RELEASE Bilateral    CARDIAC CATHETERIZATION     moderate stenosis in 1st diagonal & RCA   CESAREAN SECTION     CHOLECYSTECTOMY     EYE SURGERY Bilateral    cataracts   INNER EAR SURGERY Right    LITHOTRIPSY Right    RIGHT/LEFT HEART CATH AND CORONARY ANGIOGRAPHY N/A 10/21/2020   Procedure: RIGHT/LEFT HEART CATH AND CORONARY ANGIOGRAPHY;  Surgeon: Tonny Bollman, MD;  Location: [facility] INVASIVE CV LAB;  Service: Cardiovascular;  Laterality: N/A;   TEE WITHOUT CARDIOVERSION N/A 11/17/2020   Procedure: TRANSESOPHAGEAL ECHOCARDIOGRAM (TEE);   Surgeon: Tonny Bollman, MD;  Location: [facility] OR;  Service: Open Heart Surgery;  Laterality: N/A;   TRANSCATHETER AORTIC VALVE REPLACEMENT, TRANSFEMORAL N/A 11/17/2020   Procedure: TRANSCATHETER AORTIC VALVE REPLACEMENT, TRANSFEMORAL;  Surgeon: Tonny Bollman, MD;  Location: [facility];  Service: Open Heart Surgery;  Laterality: N/A;   TRANSTHORACIC ECHOCARDIOGRAM  07/12/2010   EF=>55% with normal systolic function; trace MR/TR/PR    Home Medications:  Allergies as of 01/16/2023       Reactions   Hydrochlorothiazide    Hypercalcemia         Medication List        Accurate as of January 16, 2023  1:49 PM. If you have any questions, ask your nurse or doctor.          amLODipine-valsartan 5-160 MG tablet Commonly known as: EXFORGE Take 1 tablet by mouth daily.   aspirin EC 81 MG tablet Take 81 mg by mouth daily. Swallow whole.   atorvastatin 80 MG tablet Commonly known as: LIPITOR Take 1 tablet by mouth once daily   CoQ10 100 MG Caps Take 100 mg by mouth in the morning and at bedtime.   diclofenac Sodium 1 % Gel Commonly known as: Voltaren Apply 2 g topically 4 (four) times daily. On days when not taking mobic   fish oil-omega-3 fatty acids 1000 MG capsule Take 1,000 mg by mouth 3 (three) times daily with  meals.   meloxicam 7.5 MG tablet Commonly known as: MOBIC Take 1 tablet (7.5 mg total) by mouth daily. What changed: additional instructions   oxybutynin 10 MG 24 hr tablet Commonly known as: DITROPAN-XL Take 1 tablet (10 mg total) by mouth 2 (two) times daily.   Ventolin HFA 108 (90 Base) MCG/ACT inhaler Generic drug: albuterol Inhale into the lungs as needed.   vitamin C 1000 MG tablet Take 1,000 mg by mouth in the morning and at bedtime. Morning & at lunch   Vitamin D3 25 MCG (1000 UT) Caps Take 1,000 Units by mouth 2 (two) times daily. Morning & at bedtime        Allergies:  Allergies  Allergen Reactions   Hydrochlorothiazide     Hypercalcemia      Family History: Family History  Problem Relation Age of Onset   Heart disease Father 60       AMI   Heart disease Brother    Heart disease Son    Heart disease Paternal Grandfather    Alzheimer's disease Mother    Heart disease Brother        CHF/CAD   COPD Brother    Heart disease Brother    Breast cancer Neg Hx     Social History:  reports that she has never smoked. She has never been exposed to tobacco smoke. She has never used smokeless tobacco. She reports that she does not drink alcohol and does not use drugs.  ROS:                                        Physical Exam: There were no vitals taken for this visit.  Constitutional:  Alert and oriented, No acute distress. HEENT: [place] AT, moist mucus membranes.  Trachea midline, no masses. Cardiovascular: No clubbing, cyanosis, or edema. .  Laboratory Data: Lab Results  Component Value Date   WBC 10.9 (H) 11/07/2022   HGB 15.5 11/07/2022   HCT 45.6 11/07/2022   MCV 93 11/07/2022   PLT 281 11/07/2022    Lab Results  Component Value Date   CREATININE 0.88 11/07/2022    No results found for: "PSA"  No results found for: "TESTOSTERONE"  Lab Results  Component Value Date   HGBA1C 6.1 (H) 11/07/2022    Urinalysis    Component Value Date/Time   COLORURINE YELLOW 11/13/2020 1431   APPEARANCEUR HAZY (A) 11/13/2020 1431   APPEARANCEUR Cloudy (A) 11/29/2016 1024   LABSPEC 1.014 11/13/2020 1431   PHURINE 6.0 11/13/2020 1431   GLUCOSEU NEGATIVE 11/13/2020 1431   HGBUR NEGATIVE 11/13/2020 1431   BILIRUBINUR NEGATIVE 11/13/2020 1431   BILIRUBINUR negative 08/12/2017 1451   BILIRUBINUR Negative 11/29/2016 1024   KETONESUR NEGATIVE 11/13/2020 1431   PROTEINUR NEGATIVE 11/13/2020 1431   UROBILINOGEN 0.2 08/12/2017 1451   NITRITE NEGATIVE 11/13/2020 1431   LEUKOCYTESUR LARGE (A) 11/13/2020 1431    Pertinent Imaging: Urine reviewed.  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  Role of trying other medication discussed.  Role of staying on the same medication discussed.  See as needed  1. Urge incontinence  - Urinalysis, Complete   No follow-ups on file.  Martina Sinner, MD  [facility] [address] [place], [place] [address] [phone]

## 2023-02-07 ENCOUNTER — Ambulatory Visit
Admission: RE | Admit: 2023-02-07 | Discharge: 2023-02-07 | Disposition: A | Discharge status: Home / Self Care | Payer: Medicare Other | Source: Ambulatory Visit | Attending: Otolaryngology | Admitting: Otolaryngology

## 2023-02-07 DIAGNOSIS — H903 Sensorineural hearing loss, bilateral: Secondary | ICD-10-CM

## 2023-02-07 DIAGNOSIS — H9120 Sudden idiopathic hearing loss, unspecified ear: Secondary | ICD-10-CM

## 2023-02-07 MED ORDER — GADOPICLENOL 0.5 MMOL/ML IV SOLN
8.0000 mL | Freq: Once | INTRAVENOUS | Status: AC | PRN
  Administered 2023-02-07: 8 mL via INTRAVENOUS

## 2023-02-14 ENCOUNTER — Telehealth: Payer: Self-pay | Admitting: Cardiovascular Disease

## 2023-02-14 ENCOUNTER — Telehealth: Payer: Self-pay | Admitting: *Deleted

## 2023-02-14 NOTE — Telephone Encounter (Signed)
Pt has been scheduled tele pre op appt 02/23/23, ok per Joni Reining, DNP. Med rec and consent are done.

## 2023-02-14 NOTE — Telephone Encounter (Signed)
   Name: Sierra Carpenter  DOB: 11-Jul-1945  MRN: [id number]  Primary Cardiologist: Thurmon Fair, MD   Preoperative team, please contact this patient and set up a phone call appointment for further preoperative risk assessment. Please obtain consent and complete medication review. Thank you for your help.Last seen by Dr. Royann Shivers on 07/11/2022.   I confirm that guidance regarding antiplatelet and oral anticoagulation therapy has been completed and, if necessary, noted below. Per office protocol, if patient is without any new symptoms or concerns at the time of their virtual visit, he/she may hold ASA for 7 days prior to procedure. Please resume ASA as soon as possible postprocedure, at the discretion of the surgeon.    I also confirmed the patient resides in the state of [place]. As per [facility] Medical Board telemedicine laws, the patient must reside in the state in which the provider is licensed.   Joni Reining, NP 02/14/2023, 11:01 AM [facility]

## 2023-02-14 NOTE — Telephone Encounter (Signed)
   Pre-operative Risk Assessment    Patient Name: Sierra Carpenter  DOB: 1945-07-07 MRN: [id number]      Request for Surgical Clearance    Procedure:   Left Maxillary Antrostomy with Tissue Removal Impossible Caldwell-Luc Approach to Left Maxillary Sinus  Date of Surgery:  Clearance 03/03/23                                 Surgeon:  Dr. Charise Carwin Surgeon's Group or Practice Name:  [facility] Phone number:  [phone] Fax number:  [phone]   Type of Clearance Requested:   - Medical  - Pharmacy:  Hold Unsure which medications need to be held      Type of Anesthesia:  General    Additional requests/questions:   [facility] is requesting for medical and pharmacy clearance.   Cathey Endow   02/14/2023, 10:01 AM

## 2023-02-14 NOTE — Telephone Encounter (Signed)
Pt has been scheduled tele pre op appt 02/23/23, ok per Joni Reining, DNP. Med rec and consent are done.     Patient Consent for Virtual Visit        Sierra Carpenter has provided verbal consent on 02/14/2023 for a virtual visit (video or telephone).   CONSENT FOR VIRTUAL VISIT FOR:  Sierra Carpenter  By participating in this virtual visit I agree to the following:  I hereby voluntarily request, consent and authorize [facility] and its employed or contracted physicians, physician assistants, nurse practitioners or other licensed health care professionals (the Practitioner), to provide me with telemedicine health care services (the "Services") as deemed necessary by the treating Practitioner. I acknowledge and consent to receive the Services by the Practitioner via telemedicine. I understand that the telemedicine visit will involve communicating with the Practitioner through live audiovisual communication technology and the disclosure of certain medical information by electronic transmission. I acknowledge that I have been given the opportunity to request an in-person assessment or other available alternative prior to the telemedicine visit and am voluntarily participating in the telemedicine visit.  I understand that I have the right to withhold or withdraw my consent to the use of telemedicine in the course of my care at any time, without affecting my right to future care or treatment, and that the Practitioner or I may terminate the telemedicine visit at any time. I understand that I have the right to inspect all information obtained and/or recorded in the course of the telemedicine visit and may receive copies of available information for a reasonable fee.  I understand that some of the potential risks of receiving the Services via telemedicine include:  Delay or interruption in medical evaluation due to technological equipment failure or disruption; Information transmitted may not be  sufficient (e.g. poor resolution of images) to allow for appropriate medical decision making by the Practitioner; and/or  In rare instances, security protocols could fail, causing a breach of personal health information.  Furthermore, I acknowledge that it is my responsibility to provide information about my medical history, conditions and care that is complete and accurate to the best of my ability. I acknowledge that Practitioner's advice, recommendations, and/or decision may be based on factors not within their control, such as incomplete or inaccurate data provided by me or distortions of diagnostic images or specimens that may result from electronic transmissions. I understand that the practice of medicine is not an exact science and that Practitioner makes no warranties or guarantees regarding treatment outcomes. I acknowledge that a copy of this consent can be made available to me via my patient portal [facility] MyChart), or I can request a printed copy by calling the office of [facility].    I understand that my insurance will be billed for this visit.   I have read or had this consent read to me. I understand the contents of this consent, which adequately explains the benefits and risks of the Services being provided via telemedicine.  I have been provided ample opportunity to ask questions regarding this consent and the Services and have had my questions answered to my satisfaction. I give my informed consent for the services to be provided through the use of telemedicine in my medical care

## 2023-02-23 ENCOUNTER — Other Ambulatory Visit (HOSPITAL_COMMUNITY): Payer: Self-pay

## 2023-02-23 ENCOUNTER — Ambulatory Visit: Payer: Medicare Other | Attending: Internal Medicine | Admitting: Cardiology

## 2023-02-23 DIAGNOSIS — Z01810 Encounter for preprocedural cardiovascular examination: Secondary | ICD-10-CM

## 2023-02-23 MED ORDER — AMOXICILLIN 500 MG PO CAPS: 2000.0000 mg | ORAL_CAPSULE | Freq: Once | ORAL | 0 refills | Status: AC

## 2023-02-23 MED ORDER — AMOXICILLIN 500 MG PO CAPS
2000.0000 mg | ORAL_CAPSULE | Freq: Once | ORAL | 0 refills | Status: DC
  Filled 2023-02-23: qty 4, 1d supply, fill #0

## 2023-02-23 NOTE — Progress Notes (Signed)
Surgical Instructions   Your procedure is scheduled on Friday March 03, 2023. Report to [facility] Main Entrance "A" at 7:30 A.M., then check in with the Admitting office. Any questions or running late day of surgery: call [phone]  Questions prior to your surgery date: call [phone], Monday-Friday, 8am-4pm. If you experience any cold or flu symptoms such as cough, fever, chills, shortness of breath, etc. between now and your scheduled surgery, please notify us at the above number.     Remember:  Do not eat after midnight the night before your surgery   You may drink clear liquids until 6:30 the morning of your surgery.   Clear liquids allowed are: Water, Non-Citrus Juices (without pulp), Carbonated Beverages, Clear Tea (no milk, honey, etc.), Black Coffee Only (NO MILK, CREAM OR POWDERED CREAMER of any kind), and Gatorade.    Take these medicines the morning of surgery with A SIP OF WATER  atorvastatin (LIPITOR)  oxybutynin (DITROPAN-XL)   May take these medicines IF NEEDED: albuterol (VENTOLIN HFA) 108 (90 Base) MCG/ACT inhaler.  Please bring inhaler with you to the hospital.   carboxymethylcellulose (REFRESH PLUS) 0.5 % SOLN    PER YOUR CARDIOLOGIST'S INSTRUCTIONS, PLEASE  STOP YOUR ASPIRIN 7 DAYS PRIOR TO SURGERY, WITH LAST DAY BEING, 02/23/2023.    One week prior to surgery, STOP taking any Aleve, Naproxen, Ibuprofen, Motrin, Advil, Goody's, BC's, all herbal medications, fish oil, and non-prescription vitamins.  This includes your meloxicam (MOBIC).                       Do NOT Smoke (Tobacco/Vaping) for 24 hours prior to your procedure.  If you use a CPAP at night, you may bring your mask/headgear for your overnight stay.   You will be asked to remove any contacts, glasses, piercing's, hearing aid's, dentures/partials prior to surgery. Please bring cases for these items if needed.    Patients discharged the day of surgery will not be allowed to drive home, and  someone needs to stay with them for 24 hours.  SURGICAL WAITING ROOM VISITATION Patients may have no more than 2 support people in the waiting area - these visitors may rotate.   Pre-op nurse will coordinate an appropriate time for 1 ADULT support person, who may not rotate, to accompany patient in pre-op.  Children under the age of 35 must have an adult with them who is not the patient and must remain in the main waiting area with an adult.  If the patient needs to stay at the hospital during part of their recovery, the visitor guidelines for inpatient rooms apply.  Please refer to the [facility] website for the visitor guidelines for any additional information.   If you received a COVID test during your pre-op visit  it is requested that you wear a mask when out in public, stay away from anyone that may not be feeling well and notify your surgeon if you develop symptoms. If you have been in contact with anyone that has tested positive in the last 10 days please notify you surgeon.      Pre-operative CHG Bathing Instructions   You can play a key role in reducing the risk of infection after surgery. Your skin needs to be as free of germs as possible. You can reduce the number of germs on your skin by washing with CHG (chlorhexidine gluconate) soap before surgery. CHG is an antiseptic soap that kills germs and continues to kill germs  even after washing.   DO NOT use if you have an allergy to chlorhexidine/CHG or antibacterial soaps. If your skin becomes reddened or irritated, stop using the CHG and notify one of our RNs at [phone].              TAKE A SHOWER THE NIGHT BEFORE SURGERY AND THE DAY OF SURGERY    Please keep in mind the following:  DO NOT shave, including legs and underarms, 48 hours prior to surgery.   You may shave your face before/day of surgery.  Place clean sheets on your bed the night before surgery Use a clean washcloth (not used since being washed) for each  shower. DO NOT sleep with pet's night before surgery.  CHG Shower Instructions:  Wash your face and private area with normal soap. If you choose to wash your hair, wash first with your normal shampoo.  After you use shampoo/soap, rinse your hair and body thoroughly to remove shampoo/soap residue.  Turn the water OFF and apply half the bottle of CHG soap to a CLEAN washcloth.  Apply CHG soap ONLY FROM YOUR NECK DOWN TO YOUR TOES (washing for 3-5 minutes)  DO NOT use CHG soap on face, private areas, open wounds, or sores.  Pay special attention to the area where your surgery is being performed.  If you are having back surgery, having someone wash your back for you may be helpful. Wait 2 minutes after CHG soap is applied, then you may rinse off the CHG soap.  Pat dry with a clean towel  Put on clean pajamas    Additional instructions for the day of surgery: DO NOT APPLY any lotions, deodorants or perfumes.   Do not wear jewelry or makeup Do not wear nail polish, gel polish, artificial nails, or any other type of covering on natural nails (fingers and toes) Do not bring valuables to the hospital. [facility] is not responsible for valuables/personal belongings. Put on clean/comfortable clothes.  Please brush your teeth.  Ask your nurse before applying any prescription medications to the skin.

## 2023-02-23 NOTE — Progress Notes (Signed)
Virtual Visit via Telephone Note   Because of Sierra Carpenter's co-morbid illnesses, she is at least at moderate risk for complications without adequate follow up.  This format is felt to be most appropriate for this patient at this time.  The patient did not have access to video technology/had technical difficulties with video requiring transitioning to audio format only (telephone).  All issues noted in this document were discussed and addressed.  No physical exam could be performed with this format.  Please refer to the patient's chart for her consent to telehealth for [facility].  Evaluation Performed:  Preoperative cardiovascular risk assessment _____________   Date:  02/23/2023   Patient ID:  Sierra Carpenter, Sierra Carpenter [birth year], MRN [id number] Patient Location:  Home Provider location:   Office  Primary Care Provider:  Sandre Kitty, MD Primary Cardiologist:  Thurmon Fair, MD  Chief Complaint / Patient Profile   77 y.o. y/o female with a h/o severe aortic stenosis s/p TAVR in 11/2020, hypertension, hypercholesterolemia, minimal coronary atherosclerotic irregularity on cardiac catheterization in July 2022.  Who is pending Left Maxillary Antrostomy with Tissue Removal Impossible Caldwell-Luc Approach to Left Maxillary Sinus and presents today for telephonic preoperative cardiovascular risk assessment.  History of Present Illness    Sierra Carpenter is a 77 y.o. female who presents via audio/video conferencing for a telehealth visit today.  Pt was last seen in cardiology clinic on 07/11/2022 by Dr. Royann Shivers. At that time Sierra Carpenter was doing well.  The patient is now pending procedure as outlined above. Since her last visit, she has remained stable from a cardiac perspective. She denies chest pain, palpitations, dyspnea, pnd, orthopnea, n, v, dizziness, syncope, edema, weight gain, or early satiety.   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 Excell Seltzer and Dr. Laneta Simmers   Severe aortic stenosis    s/p TAVR 11/17/20   Past Surgical History:  Procedure Laterality Date   ABDOMINAL HYSTERECTOMY     fibroids; ovaries intact.   APPENDECTOMY     BILATERAL CARPAL TUNNEL RELEASE Bilateral    CARDIAC CATHETERIZATION     moderate stenosis in 1st diagonal & RCA   CESAREAN SECTION     CHOLECYSTECTOMY     EYE SURGERY Bilateral    cataracts   INNER EAR SURGERY Right    LITHOTRIPSY Right    RIGHT/LEFT HEART CATH AND CORONARY ANGIOGRAPHY N/A 10/21/2020   Procedure: RIGHT/LEFT HEART CATH AND CORONARY ANGIOGRAPHY;  Surgeon: Tonny Bollman, MD;  Location: [facility] INVASIVE CV LAB;  Service: Cardiovascular;  Laterality: N/A;   TEE WITHOUT CARDIOVERSION N/A 11/17/2020   Procedure: TRANSESOPHAGEAL ECHOCARDIOGRAM (TEE);  Surgeon: Tonny Bollman, MD;  Location: [facility] OR;  Service: Open Heart Surgery;  Laterality: N/A;   TRANSCATHETER AORTIC VALVE REPLACEMENT, TRANSFEMORAL N/A 11/17/2020   Procedure: TRANSCATHETER AORTIC VALVE REPLACEMENT, TRANSFEMORAL;  Surgeon: Tonny Bollman, MD;  Location: [facility] OR;  Service: Open Heart Surgery;  Laterality: N/A;   TRANSTHORACIC ECHOCARDIOGRAM  07/12/2010   EF=>55% with normal systolic function; trace MR/TR/PR   Allergies Allergies  Allergen Reactions   Hydrochlorothiazide     Hypercalcemia    Home Medications    Prior to Admission medications   Medication Sig Start Date End Date Taking? Authorizing Provider  albuterol (VENTOLIN HFA) 108 (90 Base) MCG/ACT inhaler Inhale 1-2 puffs into the lungs every 6 (six) hours as needed for wheezing or shortness of  breath.    [provider]  amLODipine-valsartan (EXFORGE) 5-160 MG tablet Take 1 tablet by mouth daily. 12/14/22   Sandre Kitty, MD  aspirin EC 81 MG tablet Take 81 mg by mouth at bedtime. Swallow whole.    [provider]  atorvastatin (LIPITOR) 80 MG tablet Take 1 tablet by mouth once daily 09/12/22   Croitoru, Mihai, MD  carboxymethylcellulose (REFRESH PLUS) 0.5 % SOLN Place 1 drop into both eyes 3 (three) times daily as needed (dry eyes).    [provider]  Cholecalciferol (VITAMIN D3) 25 MCG (1000 UT) CAPS Take 1,000 Units by mouth 2 (two) times daily.    [provider]  Coenzyme Q10 (COQ10) 100 MG CAPS Take 100 mg by mouth in the morning and at bedtime.    [provider]  fish oil-omega-3 fatty acids 1000 MG capsule Take 1,000 mg by mouth 3 (three) times daily with meals.    [provider]  meloxicam (MOBIC) 7.5 MG tablet Take 1 tablet (7.5 mg total) by mouth daily. Patient taking differently: Take 7.5 mg by mouth every other day. 09/29/21   Carlean Jews, NP  oxybutynin (DITROPAN-XL) 10 MG 24 hr tablet Take 1 tablet (10 mg total) by mouth 2 (two) times daily. 09/19/22   Carlean Jews, NP    Physical Exam    Vital Signs:  Sierra Carpenter does not have vital signs available for review today.  Given telephonic nature of communication, physical exam is limited. AAOx3. NAD. Normal affect.  Speech and respirations are unlabored.  Accessory Clinical Findings    None  Assessment & Plan    1.  Preoperative Cardiovascular Risk Assessment: Left Maxillary Antrostomy with Tissue Removal possible Caldwell-Luc Approach to Left Maxillary Sinus   Sierra Carpenter's perioperative risk of a major cardiac event is 0.4% according to the Revised Cardiac Risk Index (RCRI).  Therefore, she is at low risk for perioperative complications.  Her functional capacity is good at 5.62 METs according to the Duke Activity Status Index  (DASI). Recommendations: According to ACC/AHA guidelines, no further cardiovascular testing needed.  The patient may proceed to surgery at acceptable risk.   Antiplatelet and/or Anticoagulation Recommendations: She may hold ASA for 7 days prior to procedure. Please resume ASA as soon as possible postprocedure, at the discretion of the surgeon.     Discussed with PharmD team that while this is not in itself a dental procedure it is recommended that she have SBE prophylaxis given history of TAVR and possible Caldwell-Luc Approach to Left Maxillary Sinus.  A prescription for amoxicillin 2 g has been sent to patient's pharmacy, it is recommended that she take this 1 hour prior to her procedure.  The patient was advised that if she develops new symptoms prior to surgery to contact our office to arrange for a follow-up visit, and she verbalized understanding.  A copy of this note will be routed to requesting surgeon.  Time:   Today, I have spent 8 minutes with the patient with telehealth technology discussing medical history, symptoms, and management plan.    Rip Harbour, NP  02/23/2023, 10:51 AM

## 2023-02-24 ENCOUNTER — Encounter (HOSPITAL_COMMUNITY): Payer: Self-pay

## 2023-02-24 ENCOUNTER — Other Ambulatory Visit: Payer: Self-pay | Admitting: Otolaryngology

## 2023-02-24 ENCOUNTER — Encounter (HOSPITAL_COMMUNITY)
Admission: RE | Admit: 2023-02-24 | Discharge: 2023-02-24 | Disposition: A | Discharge status: Home / Self Care | Payer: Medicare Other | Source: Ambulatory Visit | Attending: Otolaryngology | Admitting: Otolaryngology

## 2023-02-24 ENCOUNTER — Other Ambulatory Visit: Payer: Self-pay

## 2023-02-24 VITALS — BP 143/64 | HR 87 | Temp 97.8°F | Resp 17 | Ht 61.0 in | Wt 186.4 lb

## 2023-02-24 DIAGNOSIS — Z01812 Encounter for preprocedural laboratory examination: Secondary | ICD-10-CM | POA: Insufficient documentation

## 2023-02-24 DIAGNOSIS — E7800 Pure hypercholesterolemia, unspecified: Secondary | ICD-10-CM | POA: Insufficient documentation

## 2023-02-24 DIAGNOSIS — Z01818 Encounter for other preprocedural examination: Secondary | ICD-10-CM

## 2023-02-24 DIAGNOSIS — Z7982 Long term (current) use of aspirin: Secondary | ICD-10-CM | POA: Insufficient documentation

## 2023-02-24 DIAGNOSIS — I10 Essential (primary) hypertension: Secondary | ICD-10-CM | POA: Insufficient documentation

## 2023-02-24 DIAGNOSIS — Z952 Presence of prosthetic heart valve: Secondary | ICD-10-CM | POA: Insufficient documentation

## 2023-02-24 HISTORY — DX: Unspecified osteoarthritis, unspecified site: M19.90

## 2023-02-24 LAB — CBC
HCT: 48.2 % — ABNORMAL HIGH (ref 36.0–46.0)
Hemoglobin: 16.4 g/dL — ABNORMAL HIGH (ref 12.0–15.0)
MCH: 30.9 pg (ref 26.0–34.0)
MCHC: 34 g/dL (ref 30.0–36.0)
MCV: 90.8 fL (ref 80.0–100.0)
Platelets: 284 10*3/uL (ref 150–400)
RBC: 5.31 MIL/uL — ABNORMAL HIGH (ref 3.87–5.11)
RDW: 12.6 % (ref 11.5–15.5)
WBC: 9.3 10*3/uL (ref 4.0–10.5)
nRBC: 0 % (ref 0.0–0.2)

## 2023-02-24 LAB — BASIC METABOLIC PANEL
Anion gap: 11 (ref 5–15)
BUN: 24 mg/dL — ABNORMAL HIGH (ref 8–23)
CO2: 26 mmol/L (ref 22–32)
Calcium: 9.9 mg/dL (ref 8.9–10.3)
Chloride: 101 mmol/L (ref 98–111)
Creatinine, Ser: 1.42 mg/dL — ABNORMAL HIGH (ref 0.44–1.00)
GFR, Estimated: 39 mL/min — ABNORMAL LOW (ref >60)
Glucose, Bld: 107 mg/dL — ABNORMAL HIGH (ref 70–99)
Potassium: 4.2 mmol/L (ref 3.5–5.1)
Sodium: 138 mmol/L (ref 135–145)

## 2023-02-24 NOTE — Progress Notes (Signed)
PCP - Dr. Lenor Coffin Cardiologist - Dr. Rachelle Hora Croitoru  PPM/ICD - denies    Chest x-ray - 11/13/20 EKG - 07/11/22 Stress Test - 02/16/16 ECHO - 11/03/22 Cardiac Cath - 10/21/20  Sleep Study - denies   DM- denies  Last dose of GLP1 agonist-  n/a   Blood Thinner Instructions: n/a Aspirin Instructions: Hold 7 days. Pt took last dose on 11/20 Pt was instructed to take Amoxicillin 1 hr prior to surgery, per cardiology.  ERAS Protcol - yes, no drink   COVID TEST- n/a   Anesthesia review: yes, cardiac hx  Patient denies shortness of breath, fever, cough and chest pain at PAT appointment   All instructions explained to the patient, with a verbal understanding of the material. Patient agrees to go over the instructions while at home for a better understanding.  The opportunity to ask questions was provided.

## 2023-02-24 NOTE — Progress Notes (Signed)
Surgical Instructions   Your procedure is scheduled on Friday March 03, 2023. Report to [facility] Main Entrance "A" at 7:30 A.M., then check in with the Admitting office. Any questions or running late day of surgery: call [phone]  Questions prior to your surgery date: call [phone], Monday-Friday, 8am-4pm. If you experience any cold or flu symptoms such as cough, fever, chills, shortness of breath, etc. between now and your scheduled surgery, please notify us at the above number.     Remember:  Do not eat after midnight the night before your surgery   You may drink clear liquids until 6:30 the morning of your surgery.   Clear liquids allowed are: Water, Non-Citrus Juices (without pulp), Carbonated Beverages, Clear Tea (no milk, honey, etc.), Black Coffee Only (NO MILK, CREAM OR POWDERED CREAMER of any kind), and Gatorade.    Take these medicines the morning of surgery with A SIP OF WATER  atorvastatin (LIPITOR)  oxybutynin (DITROPAN-XL)   May take these medicines IF NEEDED: albuterol (VENTOLIN HFA) 108 (90 Base) MCG/ACT inhaler.  Please bring inhaler with you to the hospital.   carboxymethylcellulose (REFRESH PLUS) 0.5 % SOLN    PER YOUR CARDIOLOGIST'S INSTRUCTIONS, PLEASE  STOP YOUR ASPIRIN 7 DAYS PRIOR TO SURGERY, WITH LAST DAY BEING, 02/23/2023.  A prescription for amoxicillin 2 g has been sent to your pharmacy, it is recommended that you take this 1 hour prior to your procedure.    One week prior to surgery, STOP taking any Aleve, Naproxen, Ibuprofen, Motrin, Advil, Goody's, BC's, all herbal medications, fish oil, and non-prescription vitamins.  This includes your meloxicam (MOBIC).                       Do NOT Smoke (Tobacco/Vaping) for 24 hours prior to your procedure.  If you use a CPAP at night, you may bring your mask/headgear for your overnight stay.   You will be asked to remove any contacts, glasses, piercing's, hearing aid's, dentures/partials prior to  surgery. Please bring cases for these items if needed.    Patients discharged the day of surgery will not be allowed to drive home, and someone needs to stay with them for 24 hours.  SURGICAL WAITING ROOM VISITATION Patients may have no more than 2 support people in the waiting area - these visitors may rotate.   Pre-op nurse will coordinate an appropriate time for 1 ADULT support person, who may not rotate, to accompany patient in pre-op.  Children under the age of 10 must have an adult with them who is not the patient and must remain in the main waiting area with an adult.  If the patient needs to stay at the hospital during part of their recovery, the visitor guidelines for inpatient rooms apply.  Please refer to the [facility] website for the visitor guidelines for any additional information.   If you received a COVID test during your pre-op visit  it is requested that you wear a mask when out in public, stay away from anyone that may not be feeling well and notify your surgeon if you develop symptoms. If you have been in contact with anyone that has tested positive in the last 10 days please notify you surgeon.      Pre-operative CHG Bathing Instructions   You can play a key role in reducing the risk of infection after surgery. Your skin needs to be as free of germs as possible. You can reduce the number of  germs on your skin by washing with CHG (chlorhexidine gluconate) soap before surgery. CHG is an antiseptic soap that kills germs and continues to kill germs even after washing.   DO NOT use if you have an allergy to chlorhexidine/CHG or antibacterial soaps. If your skin becomes reddened or irritated, stop using the CHG and notify one of our RNs at [phone].              TAKE A SHOWER THE NIGHT BEFORE SURGERY AND THE DAY OF SURGERY    Please keep in mind the following:  DO NOT shave, including legs and underarms, 48 hours prior to surgery.   You may shave your face before/day  of surgery.  Place clean sheets on your bed the night before surgery Use a clean washcloth (not used since being washed) for each shower. DO NOT sleep with pet's night before surgery.  CHG Shower Instructions:  Wash your face and private area with normal soap. If you choose to wash your hair, wash first with your normal shampoo.  After you use shampoo/soap, rinse your hair and body thoroughly to remove shampoo/soap residue.  Turn the water OFF and apply half the bottle of CHG soap to a CLEAN washcloth.  Apply CHG soap ONLY FROM YOUR NECK DOWN TO YOUR TOES (washing for 3-5 minutes)  DO NOT use CHG soap on face, private areas, open wounds, or sores.  Pay special attention to the area where your surgery is being performed.  If you are having back surgery, having someone wash your back for you may be helpful. Wait 2 minutes after CHG soap is applied, then you may rinse off the CHG soap.  Pat dry with a clean towel  Put on clean pajamas    Additional instructions for the day of surgery: DO NOT APPLY any lotions, deodorants or perfumes.   Do not wear jewelry or makeup Do not wear nail polish, gel polish, artificial nails, or any other type of covering on natural nails (fingers and toes) Do not bring valuables to the hospital. [facility] is not responsible for valuables/personal belongings. Put on clean/comfortable clothes.  Please brush your teeth.  Ask your nurse before applying any prescription medications to the skin.

## 2023-02-27 NOTE — Anesthesia Preprocedure Evaluation (Addendum)
Anesthesia Evaluation  Patient identified by MRN, date of birth, ID band Patient awake    Reviewed: Allergy & Precautions, H&P , NPO status , Patient's Chart, lab work & pertinent test results  Airway Mallampati: II  TM Distance: >3 FB Neck ROM: Full    Dental no notable dental hx.    Pulmonary neg pulmonary ROS   Pulmonary exam normal breath sounds clear to auscultation       Cardiovascular hypertension, + Peripheral Vascular Disease  Normal cardiovascular exam Rhythm:Regular Rate:Normal  . There is a mid LV cavity gradient of at rest which increases to  with Valsalva and is related to hyperdynamic LVF.Marland Kitchen Left ventricular  ejection fraction, by estimation, is 70 to 75%. The left ventricle has  hyperdynamic function. The left  ventricle has no regional wall motion abnormalities. There is moderate  concentric left ventricular hypertrophy. Left ventricular diastolic  parameters are consistent with Grade I diastolic dysfunction (impaired  relaxation). Elevated left ventricular  end-diastolic pressure.   2. Right ventricular systolic function is normal. The right ventricular  size is normal. Tricuspid regurgitation signal is inadequate for assessing  PA pressure.   3. The mitral valve is normal in structure. No evidence of mitral valve  regurgitation. No evidence of mitral stenosis.   4. The aortic valve has been repaired/replaced. Aortic valve  regurgitation is not visualized. No aortic stenosis is present. There is a  23 mm Ultra, stented (TAVR) valve present in the aortic position. Aortic  valve mean gradient measures 11.0 mmHg.  Aortic valve Vmax measures 2.19 m/s. DVI 0.5. Findings are consistent with  normal structure and function of TAVR   5. Aortic dilatation noted. There is mild dilatation of the ascending  aorta, measuring 40 mm.   6. The inferior vena cava is normal in size with greater than 50%   respiratory variability, suggesting right atrial pressure of 3 mmHg.       Neuro/Psych negative neurological ROS  negative psych ROS   GI/Hepatic negative GI ROS, Neg liver ROS,,,  Endo/Other  negative endocrine ROS    Renal/GU negative Renal ROS  negative genitourinary   Musculoskeletal negative musculoskeletal ROS (+)    Abdominal   Peds negative pediatric ROS (+)  Hematology negative hematology ROS (+)   Anesthesia Other Findings   Reproductive/Obstetrics negative OB ROS                             Anesthesia Physical Anesthesia Plan  ASA: 3  Anesthesia Plan: General   Post-op Pain Management: Ofirmev IV (intra-op)*   Induction: Intravenous  PONV Risk Score and Plan: 3 and Ondansetron and Dexamethasone  Airway Management Planned: Oral ETT  Additional Equipment:   Intra-op Plan:   Post-operative Plan: Extubation in OR  Informed Consent: I have reviewed the patients History and Physical, chart, labs and discussed the procedure including the risks, benefits and alternatives for the proposed anesthesia with the patient or authorized representative who has indicated his/her understanding and acceptance.     Dental advisory given  Plan Discussed with: CRNA and Surgeon  Anesthesia Plan Comments: (PAT note by Antionette Poles, PA-C:  77 yo female follows with cardiology for hx of severe aortic stenosis s/p TAVR in 11/2020, hypertension, hypercholesterolemia, minimal coronary atherosclerotic irregularity on cardiac catheterization in July 2022.  She was seen by Ralph Dowdy, NP on 02/23/2023 for preop evaluation.  Per note, "Preoperative Cardiovascular Risk Assessment: Left Maxillary Antrostomy  with Tissue Removal possible Caldwell-Luc Approach to Left Maxillary Sinus  Ms. Mcquerry's perioperative risk of a major cardiac event is 0.4% according to the Revised Cardiac Risk Index (RCRI).  Therefore, she is at low risk for perioperative  complications.  Her functional capacity is good at 5.62 METs according to the Duke Activity Status Index (DASI). Recommendations: According to ACC/AHA guidelines, no further cardiovascular testing needed.  The patient may proceed to surgery at acceptable risk. Antiplatelet and/or Anticoagulation Recommendations: She may hold ASA for 7 days prior to procedure. Please resume ASA as soon as possible postprocedure, at the discretion of the surgeon. Discussed with PharmD team that while this is not in itself a dental procedure it is recommended that she have SBE prophylaxis given history of TAVR and possible Caldwell-Luc Approach to Left Maxillary Sinus.  A prescription for amoxicillin 2 g has been sent to patient's pharmacy, it is recommended that she take this 1 hour prior to her procedure."  Preop labs reviewed, creatinine mildly elevated 1.42, otherwise unremarkable.  EKG 07/11/2022: Sinus rhythm with occasional PVCs.  Rate 70.  LAD.  TTE 11/03/22: 1. There is a mid LV cavity gradient of at rest which increases to  with Valsalva and is related to hyperdynamic LVF.Marland Kitchen Left ventricular  ejection fraction, by estimation, is 70 to 75%. The left ventricle has  hyperdynamic function. The left  ventricle has no regional wall motion abnormalities. There is moderate  concentric left ventricular hypertrophy. Left ventricular diastolic  parameters are consistent with Grade I diastolic dysfunction (impaired  relaxation). Elevated left ventricular  end-diastolic pressure.  2. Right ventricular systolic function is normal. The right ventricular  size is normal. Tricuspid regurgitation signal is inadequate for assessing  PA pressure.  3. The mitral valve is normal in structure. No evidence of mitral valve  regurgitation. No evidence of mitral stenosis.  4. The aortic valve has been repaired/replaced. Aortic valve  regurgitation is not visualized. No aortic stenosis is present. There is a  23 mm  Ultra, stented (TAVR) valve present in the aortic position. Aortic  valve mean gradient measures 11.0 mmHg.  Aortic valve Vmax measures 2.19 m/s. DVI 0.5. Findings are consistent with  normal structure and function of TAVR  5. Aortic dilatation noted. There is mild dilatation of the ascending  aorta, measuring 40 mm.  6. The inferior vena cava is normal in size with greater than 50%  respiratory variability, suggesting right atrial pressure of 3 mmHg.    )        Anesthesia Quick Evaluation

## 2023-02-27 NOTE — Progress Notes (Signed)
Anesthesia Chart Review:   77 yo female follows with cardiology for hx of severe aortic stenosis s/p TAVR in 11/2020, hypertension, hypercholesterolemia, minimal coronary atherosclerotic irregularity on cardiac catheterization in July 2022.  She was seen by Ralph Dowdy, NP on 02/23/2023 for preop evaluation.  Per note, "Preoperative Cardiovascular Risk Assessment: Left Maxillary Antrostomy with Tissue Removal possible Caldwell-Luc Approach to Left Maxillary Sinus  Ms. Dittmar's perioperative risk of a major cardiac event is 0.4% according to the Revised Cardiac Risk Index (RCRI).  Therefore, she is at low risk for perioperative complications.  Her functional capacity is good at 5.62 METs according to the Duke Activity Status Index (DASI). Recommendations: According to ACC/AHA guidelines, no further cardiovascular testing needed.  The patient may proceed to surgery at acceptable risk. Antiplatelet and/or Anticoagulation Recommendations: She may hold ASA for 7 days prior to procedure. Please resume ASA as soon as possible postprocedure, at the discretion of the surgeon. Discussed with PharmD team that while this is not in itself a dental procedure it is recommended that she have SBE prophylaxis given history of TAVR and possible Caldwell-Luc Approach to Left Maxillary Sinus.  A prescription for amoxicillin 2 g has been sent to patient's pharmacy, it is recommended that she take this 1 hour prior to her procedure."  Preop labs reviewed, creatinine mildly elevated 1.42, otherwise unremarkable.  EKG 07/11/2022: Sinus rhythm with occasional PVCs.  Rate 70.  LAD.  TTE 11/03/22:  1. There is a mid LV cavity gradient of at rest which increases to  with Valsalva and is related to hyperdynamic LVF.Marland Kitchen Left ventricular  ejection fraction, by estimation, is 70 to 75%. The left ventricle has  hyperdynamic function. The left  ventricle has no regional wall motion abnormalities. There is moderate  concentric  left ventricular hypertrophy. Left ventricular diastolic  parameters are consistent with Grade I diastolic dysfunction (impaired  relaxation). Elevated left ventricular  end-diastolic pressure.   2. Right ventricular systolic function is normal. The right ventricular  size is normal. Tricuspid regurgitation signal is inadequate for assessing  PA pressure.   3. The mitral valve is normal in structure. No evidence of mitral valve  regurgitation. No evidence of mitral stenosis.   4. The aortic valve has been repaired/replaced. Aortic valve  regurgitation is not visualized. No aortic stenosis is present. There is a  23 mm Ultra, stented (TAVR) valve present in the aortic position. Aortic  valve mean gradient measures 11.0 mmHg.  Aortic valve Vmax measures 2.19 m/s. DVI 0.5. Findings are consistent with  normal structure and function of TAVR   5. Aortic dilatation noted. There is mild dilatation of the ascending  aorta, measuring 40 mm.   6. The inferior vena cava is normal in size with greater than 50%  respiratory variability, suggesting right atrial pressure of 3 mmHg.    Zannie Cove [facility] Short Stay Center/Anesthesiology Phone [phone] 02/27/2023 12:29 PM

## 2023-03-03 ENCOUNTER — Ambulatory Visit (HOSPITAL_COMMUNITY): Payer: Medicare Other | Admitting: Physician Assistant

## 2023-03-03 ENCOUNTER — Ambulatory Visit (HOSPITAL_BASED_OUTPATIENT_CLINIC_OR_DEPARTMENT_OTHER): Payer: Medicare Other | Admitting: Anesthesiology

## 2023-03-03 ENCOUNTER — Encounter (HOSPITAL_COMMUNITY): Payer: Self-pay | Admitting: Otolaryngology

## 2023-03-03 ENCOUNTER — Other Ambulatory Visit: Payer: Self-pay

## 2023-03-03 ENCOUNTER — Ambulatory Visit (HOSPITAL_COMMUNITY)
Admission: RE | Admit: 2023-03-03 | Discharge: 2023-03-03 | Disposition: A | Discharge status: Home / Self Care | Payer: Medicare Other | Attending: Otolaryngology | Admitting: Otolaryngology

## 2023-03-03 ENCOUNTER — Encounter (HOSPITAL_COMMUNITY)
Admission: RE | Disposition: A | Discharge status: Home / Self Care | Payer: Self-pay | Source: Home / Self Care | Attending: Otolaryngology

## 2023-03-03 DIAGNOSIS — Z952 Presence of prosthetic heart valve: Secondary | ICD-10-CM | POA: Insufficient documentation

## 2023-03-03 DIAGNOSIS — Z7982 Long term (current) use of aspirin: Secondary | ICD-10-CM | POA: Diagnosis not present

## 2023-03-03 DIAGNOSIS — Z791 Long term (current) use of non-steroidal anti-inflammatories (NSAID): Secondary | ICD-10-CM | POA: Diagnosis not present

## 2023-03-03 DIAGNOSIS — J3489 Other specified disorders of nose and nasal sinuses: Secondary | ICD-10-CM | POA: Diagnosis not present

## 2023-03-03 DIAGNOSIS — I517 Cardiomegaly: Secondary | ICD-10-CM | POA: Diagnosis not present

## 2023-03-03 DIAGNOSIS — I10 Essential (primary) hypertension: Secondary | ICD-10-CM | POA: Diagnosis not present

## 2023-03-03 DIAGNOSIS — I739 Peripheral vascular disease, unspecified: Secondary | ICD-10-CM | POA: Insufficient documentation

## 2023-03-03 DIAGNOSIS — D140 Benign neoplasm of middle ear, nasal cavity and accessory sinuses: Secondary | ICD-10-CM | POA: Diagnosis present

## 2023-03-03 DIAGNOSIS — Z79899 Other long term (current) drug therapy: Secondary | ICD-10-CM | POA: Diagnosis not present

## 2023-03-03 HISTORY — PX: NASAL SINUS SURGERY: SHX719

## 2023-03-03 HISTORY — PX: SINUSOTOMY: SHX291

## 2023-03-03 SURGERY — SINUS SURGERY, ENDOSCOPIC
Anesthesia: General | Laterality: Left

## 2023-03-03 MED ORDER — BACITRACIN ZINC 500 UNIT/GM EX OINT
TOPICAL_OINTMENT | CUTANEOUS | Status: AC
  Filled 2023-03-03: qty 28.35

## 2023-03-03 MED ORDER — HEMOSTATIC AGENTS (NO CHARGE) OPTIME
TOPICAL | Status: DC | PRN
  Administered 2023-03-03: 1 via TOPICAL

## 2023-03-03 MED ORDER — ALBUMIN HUMAN 5 % IV SOLN: INTRAVENOUS | Status: DC | PRN

## 2023-03-03 MED ORDER — ONDANSETRON HCL 4 MG/2ML IJ SOLN
INTRAMUSCULAR | Status: AC
  Filled 2023-03-03: qty 2

## 2023-03-03 MED ORDER — EPHEDRINE SULFATE-NACL 50-0.9 MG/10ML-% IV SOSY
PREFILLED_SYRINGE | INTRAVENOUS | Status: DC | PRN
  Administered 2023-03-03 (×2): 5 mg via INTRAVENOUS
  Administered 2023-03-03: 10 mg via INTRAVENOUS
  Administered 2023-03-03 (×2): 5 mg via INTRAVENOUS

## 2023-03-03 MED ORDER — PROPOFOL 10 MG/ML IV BOLUS
INTRAVENOUS | Status: DC | PRN
  Administered 2023-03-03: 90 mg via INTRAVENOUS

## 2023-03-03 MED ORDER — FENTANYL CITRATE (PF) 250 MCG/5ML IJ SOLN
INTRAMUSCULAR | Status: AC
  Filled 2023-03-03: qty 5

## 2023-03-03 MED ORDER — FENTANYL CITRATE (PF) 100 MCG/2ML IJ SOLN
INTRAMUSCULAR | Status: AC
  Filled 2023-03-03: qty 2

## 2023-03-03 MED ORDER — PHENYLEPHRINE 80 MCG/ML (10ML) SYRINGE FOR IV PUSH (FOR BLOOD PRESSURE SUPPORT)
PREFILLED_SYRINGE | INTRAVENOUS | Status: DC | PRN
  Administered 2023-03-03: 80 ug via INTRAVENOUS
  Administered 2023-03-03 (×2): 160 ug via INTRAVENOUS
  Administered 2023-03-03: 80 ug via INTRAVENOUS
  Administered 2023-03-03: 160 ug via INTRAVENOUS
  Administered 2023-03-03: 80 ug via INTRAVENOUS
  Administered 2023-03-03: 240 ug via INTRAVENOUS
  Administered 2023-03-03 (×2): 80 ug via INTRAVENOUS
  Administered 2023-03-03: 40 ug via INTRAVENOUS
  Administered 2023-03-03: 160 ug via INTRAVENOUS

## 2023-03-03 MED ORDER — ROCURONIUM BROMIDE 10 MG/ML (PF) SYRINGE
PREFILLED_SYRINGE | INTRAVENOUS | Status: AC
  Filled 2023-03-03: qty 10

## 2023-03-03 MED ORDER — PHENYLEPHRINE 80 MCG/ML (10ML) SYRINGE FOR IV PUSH (FOR BLOOD PRESSURE SUPPORT)
PREFILLED_SYRINGE | INTRAVENOUS | Status: AC
  Filled 2023-03-03: qty 10

## 2023-03-03 MED ORDER — FENTANYL CITRATE (PF) 250 MCG/5ML IJ SOLN
INTRAMUSCULAR | Status: DC | PRN
  Administered 2023-03-03 (×2): 50 ug via INTRAVENOUS
  Administered 2023-03-03 (×2): 25 ug via INTRAVENOUS

## 2023-03-03 MED ORDER — SUGAMMADEX SODIUM 200 MG/2ML IV SOLN
INTRAVENOUS | Status: DC | PRN
  Administered 2023-03-03: 200 mg via INTRAVENOUS

## 2023-03-03 MED ORDER — EPINEPHRINE HCL (NASAL) 0.1 % NA SOLN
NASAL | Status: DC | PRN
  Administered 2023-03-03: 20 mL via NASAL

## 2023-03-03 MED ORDER — ACETAMINOPHEN 10 MG/ML IV SOLN
INTRAVENOUS | Status: DC | PRN
  Administered 2023-03-03: 1000 mg via INTRAVENOUS

## 2023-03-03 MED ORDER — LIDOCAINE-EPINEPHRINE 1 %-1:100000 IJ SOLN
INTRAMUSCULAR | Status: DC | PRN
  Administered 2023-03-03: 4 mL

## 2023-03-03 MED ORDER — ORAL CARE MOUTH RINSE: 15.0000 mL | Freq: Once | OROMUCOSAL | Status: AC

## 2023-03-03 MED ORDER — ONDANSETRON HCL 4 MG/2ML IJ SOLN: 4.0000 mg | Freq: Once | INTRAMUSCULAR | Status: DC | PRN

## 2023-03-03 MED ORDER — FENTANYL CITRATE (PF) 100 MCG/2ML IJ SOLN
25.0000 ug | INTRAMUSCULAR | Status: DC | PRN
  Administered 2023-03-03: 50 ug via INTRAVENOUS

## 2023-03-03 MED ORDER — EPHEDRINE 5 MG/ML INJ
INTRAVENOUS | Status: AC
  Filled 2023-03-03: qty 5

## 2023-03-03 MED ORDER — HYDROCODONE-ACETAMINOPHEN 5-325 MG PO TABS: 1.0000 | ORAL_TABLET | Freq: Four times a day (QID) | ORAL | 0 refills | Status: AC | PRN

## 2023-03-03 MED ORDER — DEXMEDETOMIDINE HCL IN NACL 200 MCG/50ML IV SOLN
INTRAVENOUS | Status: DC | PRN
Start: 2023-03-03 — End: 2023-03-03
  Administered 2023-03-03 (×2): 8 ug via INTRAVENOUS

## 2023-03-03 MED ORDER — CHLORHEXIDINE GLUCONATE 0.12 % MT SOLN
15.0000 mL | Freq: Once | OROMUCOSAL | Status: AC
Start: 2023-03-03 — End: 2023-03-03

## 2023-03-03 MED ORDER — DEXAMETHASONE SODIUM PHOSPHATE 10 MG/ML IJ SOLN
INTRAMUSCULAR | Status: DC | PRN
  Administered 2023-03-03: 10 mg via INTRAVENOUS

## 2023-03-03 MED ORDER — OXYCODONE HCL 5 MG/5ML PO SOLN: 5.0000 mg | Freq: Once | ORAL | Status: DC | PRN

## 2023-03-03 MED ORDER — FLUORESCEIN SODIUM 1 MG OP STRP
ORAL_STRIP | OPHTHALMIC | Status: AC
  Filled 2023-03-03: qty 1

## 2023-03-03 MED ORDER — LIDOCAINE 2% (20 MG/ML) 5 ML SYRINGE
INTRAMUSCULAR | Status: DC | PRN
  Administered 2023-03-03: 60 mg via INTRAVENOUS

## 2023-03-03 MED ORDER — DEXMEDETOMIDINE HCL IN NACL 80 MCG/20ML IV SOLN
INTRAVENOUS | Status: AC
  Filled 2023-03-03: qty 20

## 2023-03-03 MED ORDER — ROCURONIUM BROMIDE 10 MG/ML (PF) SYRINGE
PREFILLED_SYRINGE | INTRAVENOUS | Status: DC | PRN
  Administered 2023-03-03: 60 mg via INTRAVENOUS

## 2023-03-03 MED ORDER — PHENYLEPHRINE HCL-NACL 20-0.9 MG/250ML-% IV SOLN
INTRAVENOUS | Status: DC | PRN
  Administered 2023-03-03: 25 ug/min via INTRAVENOUS

## 2023-03-03 MED ORDER — FLUORESCEIN SODIUM 1 MG OP STRP
ORAL_STRIP | OPHTHALMIC | Status: DC | PRN
  Administered 2023-03-03: 1

## 2023-03-03 MED ORDER — OXYCODONE HCL 5 MG PO TABS: 5.0000 mg | ORAL_TABLET | Freq: Once | ORAL | Status: DC | PRN

## 2023-03-03 MED ORDER — ONDANSETRON HCL 4 MG/2ML IJ SOLN
INTRAMUSCULAR | Status: DC | PRN
  Administered 2023-03-03: 4 mg via INTRAVENOUS

## 2023-03-03 MED ORDER — PROPOFOL 10 MG/ML IV BOLUS
INTRAVENOUS | Status: AC
  Filled 2023-03-03: qty 20

## 2023-03-03 MED ORDER — DEXAMETHASONE SODIUM PHOSPHATE 10 MG/ML IJ SOLN
INTRAMUSCULAR | Status: AC
  Filled 2023-03-03: qty 1

## 2023-03-03 MED ORDER — SODIUM CHLORIDE 0.9 % IR SOLN
Status: DC | PRN
  Administered 2023-03-03: 1000 mL

## 2023-03-03 MED ORDER — ACETAMINOPHEN 10 MG/ML IV SOLN
INTRAVENOUS | Status: AC
  Filled 2023-03-03: qty 100

## 2023-03-03 MED ORDER — CHLORHEXIDINE GLUCONATE 0.12 % MT SOLN
OROMUCOSAL | Status: AC
  Administered 2023-03-03: 15 mL via OROMUCOSAL
  Filled 2023-03-03: qty 15

## 2023-03-03 MED ORDER — EPINEPHRINE HCL (NASAL) 0.1 % NA SOLN
NASAL | Status: AC
  Filled 2023-03-03: qty 90

## 2023-03-03 MED ORDER — LACTATED RINGERS IV SOLN: INTRAVENOUS | Status: DC

## 2023-03-03 MED ORDER — LIDOCAINE-EPINEPHRINE 1 %-1:100000 IJ SOLN
INTRAMUSCULAR | Status: AC
  Filled 2023-03-03: qty 1

## 2023-03-03 SURGICAL SUPPLY — 67 items
ANTIFOG SOL W/FOAM PAD STRL (MISCELLANEOUS) ×1
ATTRACTOMAT 16X20 MAGNETIC DRP (DRAPES) IMPLANT
BAG COUNTER SPONGE SURGICOUNT (BAG) ×1 IMPLANT
BLADE NAVIG QUADCUT 4.3X13 M4 (BLADE) ×1 IMPLANT
BLADE RAD60 ROTATE M4 4 5PK (BLADE) IMPLANT
BLADE ROTATE RAD 40 4 M4 (BLADE) IMPLANT
BLADE ROTATE TRICUT 4X13 M4 (BLADE) IMPLANT
BLADE SURG 15 STRL LF DISP TIS (BLADE) IMPLANT
BUR SRG MED 1.6XXCUT FSSR (BURR) IMPLANT
BUR TAPER CHOANAL ATRESIA 30K (BURR) IMPLANT
BURR SRG MED 1.6XXCUT FSSR (BURR) ×1
CANISTER SUCT 3000ML PPV (MISCELLANEOUS) ×2 IMPLANT
COAGULATOR SUCT 8FR VV (MISCELLANEOUS) ×1 IMPLANT
DRAPE HALF SHEET 40X57 (DRAPES) IMPLANT
DRESSING NASAL KENNEDY 3.5X.9 (MISCELLANEOUS) IMPLANT
DRILL TW 1.1X50 5MM STOP W/NT (MISCELLANEOUS) IMPLANT
DRSG NASAL KENNEDY 3.5X.9 (MISCELLANEOUS)
ELECT COATED BLADE 2.86 ST (ELECTRODE) IMPLANT
ELECT NDL BLADE 2-5/6 (NEEDLE) IMPLANT
ELECT NEEDLE BLADE 2-5/6 (NEEDLE)
ELECT REM PT RETURN 9FT ADLT (ELECTROSURGICAL) ×1
ELECTRODE REM PT RTRN 9FT ADLT (ELECTROSURGICAL) ×1 IMPLANT
FILTER ARTHROSCOPY CONVERTOR (FILTER) ×2 IMPLANT
GLOVE BIO SURGEON STRL SZ7.5 (GLOVE) ×1 IMPLANT
GLOVE BIOGEL PI IND STRL 8 (GLOVE) ×1 IMPLANT
GOWN STRL REUS W/ TWL LRG LVL3 (GOWN DISPOSABLE) ×1 IMPLANT
GOWN STRL REUS W/ TWL XL LVL3 (GOWN DISPOSABLE) ×1 IMPLANT
IV NS 1000ML BAXH (IV SOLUTION) ×2 IMPLANT
KIT BASIN OR (CUSTOM PROCEDURE TRAY) ×1 IMPLANT
KIT TURNOVER KIT B (KITS) ×1 IMPLANT
NDL PRECISIONGLIDE 27X1.5 (NEEDLE) IMPLANT
NDL SPNL 25GX3.5 QUINCKE BL (NEEDLE) ×1 IMPLANT
NEEDLE PRECISIONGLIDE 27X1.5 (NEEDLE)
NEEDLE SPNL 25GX3.5 QUINCKE BL (NEEDLE) ×1
NS IRRIG 1000ML POUR BTL (IV SOLUTION) ×1 IMPLANT
PAD ARMBOARD 7.5X6 YLW CONV (MISCELLANEOUS) ×2 IMPLANT
PATTIES SURGICAL .5 X3 (DISPOSABLE) ×1 IMPLANT
PENCIL SMOKE EVACUATOR (MISCELLANEOUS) IMPLANT
PLATE 1.5 5HOLE SQUARE (Plate) IMPLANT
PLATE Y LONG 1.5 5H (Plate) IMPLANT
SCREW HT X DR EMRG 1.8X5MM (Screw) IMPLANT
SCREW NL XD LORENZ HT 1.5X3.5 (Screw) IMPLANT
SCREW NON LOCK HT X-DR 1.5X4 (Screw) IMPLANT
SHEATH ENDOSCRUB 0 DEG (SHEATH) ×1 IMPLANT
SHEATH ENDOSCRUB 30 DEG (SHEATH) IMPLANT
SOLUTION ANTFG W/FOAM PAD STRL (MISCELLANEOUS) ×1 IMPLANT
SPECIMEN JAR SMALL (MISCELLANEOUS) IMPLANT
SPLINT NASAL DOYLE BI-VL (GAUZE/BANDAGES/DRESSINGS) IMPLANT
SPLINT NASAL POSISEP X .6X2 (GAUZE/BANDAGES/DRESSINGS) IMPLANT
SPLINT NASAL POSISEP X2 .8X2.3 (GAUZE/BANDAGES/DRESSINGS) IMPLANT
SUT CHROMIC 4 0 P 3 18 (SUTURE) IMPLANT
SUT CHROMIC 4 0 SH 27 (SUTURE) IMPLANT
SUT CHROMIC 5 0 P 3 (SUTURE) IMPLANT
SUT ETHILON 3 0 FSL (SUTURE) IMPLANT
SUT VIC AB 4-0 RB1 18 (SUTURE) IMPLANT
SWAB COLLECTION DEVICE MRSA (MISCELLANEOUS) IMPLANT
SWAB CULTURE ESWAB REG 1ML (MISCELLANEOUS) IMPLANT
SYR 30ML LL (SYRINGE) ×1 IMPLANT
SYR 3ML LL SCALE MARK (SYRINGE) ×1 IMPLANT
SYR CONTROL 10ML LL (SYRINGE) IMPLANT
TOWEL GREEN STERILE FF (TOWEL DISPOSABLE) ×1 IMPLANT
TRACKER ENT INSTRUMENT (MISCELLANEOUS) ×1 IMPLANT
TRACKER ENT PATIENT (MISCELLANEOUS) ×1 IMPLANT
TRAY ENT MC OR (CUSTOM PROCEDURE TRAY) ×1 IMPLANT
TUBE CONNECTING 12X1/4 (SUCTIONS) ×1 IMPLANT
TUBING STRAIGHTSHOT EPS 5PK (TUBING) ×1 IMPLANT
TW DRILL1.1X50 5MM STOP W/NT (MISCELLANEOUS) ×1

## 2023-03-03 NOTE — Anesthesia Procedure Notes (Signed)
Procedure Name: Intubation Date/Time: 03/03/2023 9:42 AM  Performed by: Ammie Dalton, CRNAPre-anesthesia Checklist: Patient identified, Emergency Drugs available, Suction available and Patient being monitored Patient Re-evaluated:Patient Re-evaluated prior to induction Oxygen Delivery Method: Circle System Utilized Preoxygenation: Pre-oxygenation with 100% oxygen Induction Type: IV induction Ventilation: Mask ventilation without difficulty and Oral airway inserted - appropriate to patient size Laryngoscope Size: Mac and 3 Grade View: Grade I Tube type: Oral Tube size: 6.5 mm Number of attempts: 1 Airway Equipment and Method: Stylet and Oral airway Placement Confirmation: ETT inserted through vocal cords under direct vision, positive ETCO2 and breath sounds checked- equal and bilateral Secured at: 21 cm Tube secured with: Tape Dental Injury: Teeth and Oropharynx as per pre-operative assessment

## 2023-03-03 NOTE — Transfer of Care (Signed)
Immediate Anesthesia Transfer of Care Note  Patient: [name]  Procedure(s) Performed: FUNCTIONAL ENDOSCOPIC MAXILLARY ANTROSTOMY WITH TISSUE REMOVAL SINUS SURGERY (Left) CALDWELL LUC APPROACH (Left)  Patient Location: PACU  Anesthesia Type:General  Level of Consciousness: awake, alert , and oriented  Airway & Oxygen Therapy: Patient Spontanous Breathing and Patient connected to face mask oxygen  Post-op Assessment: Report given to RN and Post -op Vital signs reviewed and stable  Post vital signs: Reviewed and stable  Last Vitals:  Vitals Value Taken Time  BP 118/58 03/03/23 1145  Temp 37.3 C 03/03/23 1145  Pulse 80 03/03/23 1148  Resp 20 03/03/23 1148  SpO2 94 % 03/03/23 1148  Vitals shown include unfiled device data.  Last Pain:  Vitals:   03/03/23 0738  PainSc: 0-No pain         Complications: No notable events documented.

## 2023-03-03 NOTE — Discharge Instructions (Signed)
Endoscopic Sinus Surgery: Postoperative Care Instructions  Your active participation in the postoperative phase of treatment is critical to the success of your surgery. Please read and follow the instructions below.  Bleeding: It is normal to experience some nasal bleeding the afternoon/evening of surgery. If bleeding occurs, lean forward slightly and gently pinch the bottom 2/3 of your nose between your thumb and forefinger. Hold this for approximately 10 minutes. If you are still experiencing bleeding, you may apply Afrin (other options include neo-synephrine, 4 way nasal spray, etc) to your nose to shrink the blood vessels. Spray 3 puffs to the affected nostril(s) and then gently pinch the nose for 10 minutes. If this does not relieve the bleeding or if the bleeding is more significant, please call the hospital operator and ask for the ENT doctor on call. If extensive bleeding occurs, please call 911 or be seen at your closest emergency department. Diet: Please adhere to a SOFT diet for 10 days while your mouth is healing. Avoid upper denture use until approved by Dr. Ernestene Kiel  Medications:  Pain medication: The only mediation you will need to take the night of surgery is pain medication. If you wish a non-narcotic alternative, extra-strength Tylenol and ibuprofen are often sufficient.  Antibiotics/oral steroids: If you are prescribed either antibiotic or oral steroids, start these the day after surgery.   Nasal steroid spray: Start (or restart) your nasal steroid spray one week after surgery.   Aspirin: DO NOT take aspirin for at least three days after the surgery. Saline:  Moisture is critical to proper healing. You will need to moisturize and rinse your nose for at least 6 weeks after surgery. Nasal Mist: The day after surgery, start spraying your nose with saline mist spray. Apply 2-3 sprays to each nostril every one to two hours throughout the day. Common brands include: Ayr, Ocean, or Simply  Saline. They are available over-the-counter at most pharmacies.  Nasal Irrigations: The day after surgery, begin sinus rinses twice a day using the "NeilMed Sinus Rinse Kit"  (available for purchase at many pharmacies). Use gentle pressure when irrigating. If you have any discomfort with irrigation, reduce the amount of pressure you are applying or discontinue irrigations until the first post-operative visit.  Post-operative care: Do not blow your nose for the first week after surgery. You may sniff back. If you need to sneeze, do not suppress it but instead sneeze with your mouth open. After 1 week, you may blow your nose gently. No strenuous activity for one week after surgery. No straining or lifting more than 15 lbs.  You should not bend over at the waist to pick things up. Instead bend at the knees, with your head up. Light walking and normal household activities are acceptable immediately after surgery. You may resume exercise at 50% intensity after one week, and full intensity at two weeks. You may drive the day after surgery if you are not requiring narcotic pain medication. If you are taking antibiotics and experience stomach upset, active culture yogurt (Nancy's yogurt) or lactobacillus tablets (available at health food stores) on a daily basis may help.  If you develop diarrhea, stop your antibiotics and contact our office.  Persistent diarrhea may require further medical evaluation. You may eat a regular diet. You should plan on taking one week off from work and ideally have a half-day planned for your first day back. Do not fly without your doctor's clearance for 7 days after surgery. Post-operative visits: You will have frequent  return visits to our office after surgery.  At each visit, a procedure called nasal endoscopy will be performed. The sinus cavities may be cleaned (termed "debridement") in order to assure appropriate healing of the sinuses. Visits typically occur 1, 3, and 6  weeks after surgery. We recommend taking a dose of pain medication about 45 minutes to one hour before your first postoperative visit (as long as someone can accompany you to your visit).  Post-operative visits are usually scheduled at the time of surgery scheduling, however if you have any questions about your post-operative visit you can contact our clinic at [phone]. Call our office if you experience any of the following: Fever higher than 101?F Clear, watery nasal drainage Any visual changes or marked swelling of the eyes Severe headache or neck stiffness Severe diarrhea Brisk bleeding  If you need to speak to a doctor after hours, please call the [facility] at [phone] and ask for the ENT doctor on call, or call 911 for emergencies.

## 2023-03-03 NOTE — Anesthesia Postprocedure Evaluation (Signed)
Anesthesia Post Note  Patient: Sierra Carpenter  Procedure(s) Performed: FUNCTIONAL ENDOSCOPIC MAXILLARY ANTROSTOMY WITH TISSUE REMOVAL SINUS SURGERY (Left) CALDWELL LUC APPROACH (Left)     Patient location during evaluation: PACU Anesthesia Type: General Level of consciousness: awake and alert Pain management: pain level controlled Vital Signs Assessment: post-procedure vital signs reviewed and stable Respiratory status: spontaneous breathing, nonlabored ventilation, respiratory function stable and patient connected to nasal cannula oxygen Cardiovascular status: blood pressure returned to baseline and stable Postop Assessment: no apparent nausea or vomiting Anesthetic complications: no  No notable events documented.  Last Vitals:  Vitals:   03/03/23 0738 03/03/23 1145  BP: (!) 158/86 (!) 118/58  Pulse: 88 85  Resp: 18 15  Temp: 36.6 C 37.3 C  SpO2: 93% 93%    Last Pain:  Vitals:   03/03/23 1145  PainSc: 0-No pain                 [name] S

## 2023-03-03 NOTE — Op Note (Signed)
OPERATIVE NOTE  Elinor J Laitinen Date/Time of Admission: 03/03/2023  7:21 AM  CSN: [id number];MRN:[id number] Attending Provider: Scarlette Ar, MD Room/Bed: MCPO/NONE DOB: 07/10/1945 Age: 77 y.o.   Pre-Op Diagnosis: Maxillary sinus mass  Post-Op Diagnosis: Maxillary sinus mass  Procedure: Left radical maxillary sinusotomy with removal of maxillary sinus tumor (CPT 44010)  Anesthesia: General  Surgeon(s): Mervin Kung, MD  Staff: Circulator: Ivin Booty, RN Relief Circulator: Rhys Martini, RN Scrub Person: Dorris Carnes Vendor Representative : Langley Adie RN First Assistant: Genella Rife, RN  Implants: Implant Name Type Inv. Item Serial No. Manufacturer Lot No. LRB No. Used Action  PLATE 1.5 5HOLE SQUARE - [id number] Plate PLATE 1.5 5HOLE SQUARE  BIOMET ZIMMER MICROFIXATION  N/A 1 Implanted  SCREW NON LOCK HT X-DR 1.5X4 - [id number] Screw SCREW NON LOCK HT X-DR 1.5X4  BIOMET ZIMMER MICROFIXATION  N/A 4 Implanted  1.5x3.5MM HT X-DRIVE SCREW    BIOMET ZIMMER MICROFIXATION  N/A 4 Implanted  PLATE Y LONG 1.5 5H - [id number] Plate PLATE Y LONG 1.5 5H  BIOMET ZIMMER MICROFIXATION  N/A 1 Implanted  SCREW HT X DR EMRG 1.8X5MM - [id number] Screw SCREW HT X DR EMRG 1.8X5MM  BIOMET ZIMMER MICROFIXATION  N/A 1 Implanted    Specimens: ID Type Source Tests Collected by Time Destination  1 : left maxillary sinus mass Tissue PATH ENT excision SURGICAL PATHOLOGY Scarlette Ar, MD 03/03/2023 1033     Complications: none  EBL: 25 ML  IVF: Per anesthesia ML  Condition: stable  Operative Findings:  ~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 Gross total excision with burring down of periosteal base. V2 nerve identified and preserved. Osteoplastic bone flap replaced with titanium plates and screws (single 4 hole box plate, single Y-plate, total of 8 screws)  Haiku  Photos:  Endoscopic view after maxillary antrostomy - transnasal:    Open view - via Caldwell luc approach:    Endoscopic view after open removal:     Indications for Procedure: Sierra Carpenter is a 77 year old female referred to me for asymmetric sensorineural hearing loss December 26, 2022.  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  She presents today for surgical management of left maxillary sinus tumor.  Informed consent was obtained RBA discussed. Risks discussed in detail including pain, bleeding, infection, injury to the eye with associated vision changes, vision loss, injury to the brain with associated CSF leak or meningitis, failure to improve or worsening of sense of smell, nasal synechiae, nasal septal perforation, V2 injury/numbness, recurrent tumor need for further procedures, risks of general anesthesia.   Description of Operation:  The patient was identified in the preoperative area and consent confirmed the chart.  She brought to the operating room by the anesthetist and a preoperative huddle was performed confirming the patient identity and procedure to be performed.  Once all were in agreement we proceeded with surgery.  General anesthesia was induced and the patient was intubated with oral endotracheal tube.  Adrenaline pledgets were placed into the nasal cavity for decongestant.  The left sublabial vestibular incision for the Elijah Birk luck approach to the maxillary sinus as anticipated was infiltrated with 1% lidocaine 1 100,000 epinephrine.  The patient was prepped and draped in standard clean fashion for a procedure of this kind.  A final preoperative pause was performed and we proceeded with surgery.  To  begin the 0 and 30 degree rigid nasal endoscope were attached to the video monitoring system and used for the surgical procedure.  The nasal pledgets were removed.    We proceeded with left  endoscopic maxillary antrostomy to assess the maxillary sinus tumor.  A Glorious Peach was used to gently medialized the middle turbinate.  A small ball-tipped probe probe was used to outfracture the uncinate process.  A backbiter was used to remove the uncinate process.  The small ball-tipped probe was used to enter the true maxillary ostia and widen it.  Through-cutting forceps were then used to perform an endoscopic maxillary sinusotomy back to the posterior wall of the maxillary sinus.  A Stammberger down-biting forcep was used to remove the maxillary sinus wall down to the inferior turbinate.  A backbiter was used to complete this anteriorly.  A microdebrider was used to remove residual maxillary sinusotomy partitions.  A 30 degree endoscope was then brought in the field to visualize the tumor.  Given the high superior lateral location of the tumor and difficulty with endoscopic resection, we then converted to open maxillary sinusotomy via a radical maxillary sinusotomy with tissue removal of the maxillary sinus tumor.  We turned to the oral cavity. Army-Navy's were applied and a Bovie was used to create a left sublabial approach.  Periosteal elevators were used to elevate a submucoperiosteal plane superiorly to the level of the V2 neurovascular bundle from its foramen; this was visualized and preserved throughout the dissection.  The dissection was taken up to the zygomatic body.  Next a bone window approximately 2 x 1.5 cm was created well below the V2 nerve preserving both lateral and medial buttress.  There were no tooth roots left as the patient was completely edentulous.  A 1.6 mm fissure bur was then used to create the osteoplastic flap.  This was set aside in saline.  Kerrison rongeurs were used to widen the approach superior laterally up to the zygoma due to the high location of the tumor.  Once adequate exposure was created a Bovie was used to create mucosal cuts surrounding the tumor with a cuff of  normal mucosa.  Subperiosteal elevators were used to elevate the tumor off the maxillary sinus wall and the specimen was then removed and sent for permanent pathology.  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  A choanal atresia bur was then used to bur the bone completely smoothing out.  The sinus area was thoroughly visualized and there was no residual signs of irregular mucosa.  The cavity was copiously irrigated with sterile saline and suction.  Next we proceeded with open reduction internal fixation of the  2 separate osteoplastic flaps using Zimmer Biomet titanium midface plates and screws as stated in the above findings.  We then proceeded with closure.  The vestibular incision was closed with interrupted 3-0 Vicryl horizontal mattress sutures.  Chitosan nasal splint was placed in left middle meatus and irrigated with saline.  An orogastric tube was used to suction out pharyngoesophageal contents.  The patient was then turned back anesthetist approximate her brother recovery in stable condition.    Mervin Kung, MD [facility]  03/03/2023

## 2023-03-03 NOTE — Progress Notes (Signed)
Amoxicillin 2g given to patient per order from cardiologist. Pt brought medication from home.

## 2023-03-03 NOTE — H&P (Signed)
Sierra Carpenter is an 77 y.o. female.    Chief Complaint:  Left maxillary sinus mass  HPI: Patient presents today for planned elective procedure.  He/she denies any interval change in history since office visit on 12/26/22.   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 Excell Seltzer and Dr. Laneta Simmers   Severe aortic stenosis    s/p TAVR 11/17/20    Past Surgical History:  Procedure Laterality Date   ABDOMINAL HYSTERECTOMY     fibroids; ovaries intact.   APPENDECTOMY     BILATERAL CARPAL TUNNEL RELEASE Bilateral    CARDIAC CATHETERIZATION     moderate stenosis in 1st diagonal & RCA   CESAREAN SECTION     CHOLECYSTECTOMY     EYE SURGERY Bilateral    cataracts   INNER EAR SURGERY Right    LITHOTRIPSY Right    RIGHT/LEFT HEART CATH AND CORONARY ANGIOGRAPHY N/A 10/21/2020   Procedure: RIGHT/LEFT HEART CATH AND CORONARY ANGIOGRAPHY;  Surgeon: Tonny Bollman, MD;  Location: [facility] INVASIVE CV LAB;  Service: Cardiovascular;  Laterality: N/A;   TEE WITHOUT CARDIOVERSION N/A 11/17/2020   Procedure: TRANSESOPHAGEAL ECHOCARDIOGRAM (TEE);  Surgeon: Tonny Bollman, MD;  Location: [facility] OR;  Service: Open Heart Surgery;  Laterality: N/A;   TRANSCATHETER AORTIC VALVE REPLACEMENT, TRANSFEMORAL N/A 11/17/2020   Procedure: TRANSCATHETER AORTIC VALVE REPLACEMENT, TRANSFEMORAL;  Surgeon: Tonny Bollman, MD;  Location: [facility] OR;  Service: Open Heart Surgery;  Laterality: N/A;   TRANSTHORACIC ECHOCARDIOGRAM  07/12/2010   EF=>55% with normal systolic function; trace MR/TR/PR    Family History  Problem Relation Age of Onset    Heart disease Father 19       AMI   Heart disease Brother    Heart disease Son    Heart disease Paternal Grandfather    Alzheimer's disease Mother    Heart disease Brother        CHF/CAD   COPD Brother    Heart disease Brother    Breast cancer Neg Hx     Social History:  reports that she has never smoked. She has never been exposed to tobacco smoke. She has never used smokeless tobacco. She reports that she does not drink alcohol and does not use drugs.  Allergies:  Allergies  Allergen Reactions   Hydrochlorothiazide     Hypercalcemia     Medications Prior to Admission  Medication Sig Dispense Refill   amLODipine-valsartan (EXFORGE) 5-160 MG tablet Take 1 tablet by mouth daily. 90 tablet 3   aspirin EC 81 MG tablet Take 81 mg by mouth at bedtime. Swallow whole.     atorvastatin (LIPITOR) 80 MG tablet Take 1 tablet by mouth once daily 90 tablet 3   carboxymethylcellulose (REFRESH PLUS) 0.5 % SOLN Place 1 drop into both eyes 3 (three) times daily as needed (dry eyes).     Cholecalciferol (VITAMIN D3) 25 MCG (1000 UT) CAPS Take 1,000 Units by mouth 2 (two) times daily.     Coenzyme Q10 (COQ10) 100 MG CAPS Take 100 mg by mouth in the morning and at bedtime.  fish oil-omega-3 fatty acids 1000 MG capsule Take 1,000 mg by mouth 3 (three) times daily with meals.     meloxicam (MOBIC) 7.5 MG tablet Take 1 tablet (7.5 mg total) by mouth daily. (Patient taking differently: Take 7.5 mg by mouth every other day.) 45 tablet 2   oxybutynin (DITROPAN-XL) 10 MG 24 hr tablet Take 1 tablet (10 mg total) by mouth 2 (two) times daily. 180 tablet 1   albuterol (VENTOLIN HFA) 108 (90 Base) MCG/ACT inhaler Inhale 1-2 puffs into the lungs every 6 (six) hours as needed for wheezing or shortness of breath.      No results found for this or any previous visit (from the past 48 hour(s)). No results found.  ROS: negative other than stated in HPI  Blood pressure (!) 158/86, pulse 88, temperature 97.8 F  (36.6 C), resp. rate 18, height 5\' 1"  (1.549 m), weight 84.5 kg, SpO2 93%.  PHYSICAL EXAM: General: Resting comfortably in NAD  Lungs: Non-labored respiratinos  Studies Reviewed:  MRI Brain w/wo contrast 11/0/6/24  IMPRESSION: 1. No cerebellopontine angle or internal auditory canal mass. 2. Fluid signal is poorly delineated within portions of the posterior semicircular canal on the left. 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 Consider a temporal bone CT for further evaluation. 3. Parenchymal atrophy and chronic small vessel ischemic disease as described. 4. Indeterminate 1.7 cm enhancing mass within the left maxillary sinus. Nasal endoscopy/direct visualization should be considered. 5. 1.8 cm left maxillary sinus mucous retention cyst. 6. Trace fluid within the left mastoid air cells.     Electronically Signed   By: Jackey Loge D.O.   On: 02/08/2023 21:05    CT sinus - [facility] - Left maxillary sinus mass as evident on MRI - Sinus anatomy otherwise normal  - Pneumatized V2 canal in anterior maxillary sinus abutting the tumor  Assessment/Plan Left maxillary sinus mass  Proceed with left FESS (Maxillary antrostomy with tissue removal), possible left caldwell luc approach to the maxillary sinus (Radical maxillary antrostomy). Informed consent obtained. RBA discussed. Risks discussed in detail including pain, bleeding, infection, injury to the eye with associated vision changes, vision loss, injury to the brain with associated CSF leak or meningitis, V2 nerve injury with facial numbness/neuropathic pain, recurrent tumor, failure to improve or worsening of sense of smell, nasal synechiae, nasal septal perforation, need for further procedures, risks of general anesthesia.      Electronically signed by:  Scarlette Ar, MD  Staff Physician Facial Plastic & Reconstructive Surgery Otolaryngology - Head and Neck Surgery [facility]  03/03/2023, 8:38 AM

## 2023-03-04 ENCOUNTER — Other Ambulatory Visit: Payer: Self-pay | Admitting: Nurse Practitioner

## 2023-03-04 DIAGNOSIS — G8929 Other chronic pain: Secondary | ICD-10-CM

## 2023-03-04 DIAGNOSIS — N3941 Urge incontinence: Secondary | ICD-10-CM

## 2023-03-06 ENCOUNTER — Encounter (HOSPITAL_COMMUNITY): Payer: Self-pay | Admitting: Otolaryngology

## 2023-03-06 LAB — SURGICAL PATHOLOGY

## 2023-03-06 MED ORDER — MELOXICAM 7.5 MG PO TABS: 7.5000 mg | ORAL_TABLET | ORAL | 0 refills | Status: DC

## 2023-03-06 MED ORDER — OXYBUTYNIN CHLORIDE ER 10 MG PO TB24
10.0000 mg | ORAL_TABLET | Freq: Two times a day (BID) | ORAL | 1 refills | Status: AC
Start: 2023-03-06 — End: ?

## 2023-03-15 ENCOUNTER — Encounter: Payer: Self-pay | Admitting: Family Medicine

## 2023-03-15 ENCOUNTER — Ambulatory Visit: Payer: Medicare Other | Admitting: Family Medicine

## 2023-03-15 VITALS — BP 127/82 | HR 85 | Ht 61.0 in | Wt 182.4 lb

## 2023-03-15 DIAGNOSIS — M545 Low back pain, unspecified: Secondary | ICD-10-CM | POA: Diagnosis not present

## 2023-03-15 DIAGNOSIS — I10 Essential (primary) hypertension: Secondary | ICD-10-CM | POA: Diagnosis not present

## 2023-03-15 DIAGNOSIS — G8929 Other chronic pain: Secondary | ICD-10-CM

## 2023-03-15 DIAGNOSIS — D140 Benign neoplasm of middle ear, nasal cavity and accessory sinuses: Secondary | ICD-10-CM | POA: Diagnosis not present

## 2023-03-15 NOTE — Assessment & Plan Note (Signed)
Continue 7.5 mg meloxicam every other day.  Working well for her.

## 2023-03-15 NOTE — Progress Notes (Signed)
   Established Patient Office Visit  Subjective   Patient ID: Sierra Carpenter, female    DOB: [birth year]  Age: 77 y.o. MRN: [id number]  Chief Complaint  Patient presents with   Medical Management of Chronic Issues    HPI  Hypertension-patient taking her amlodipine/valsartan combo pill.  No concerns.  Tolerating it well.  Blood pressure readings that she brought up in well-controlled.    Chronic back pain-patient taking her 7.5 mg of meloxicam every other day  Patient brought the pathology findings and CT scan results from her recent ENT procedure.  She still has stitches in her gums and these are healing well but she has to eat soft foods until she is able to put her top dentures in again.  Has appointment with ENT again 12/17.  The 10-year ASCVD risk score (Arnett DK, et al., 2019) is: 18.2%  Health Maintenance Due  Topic Date Due   COVID-19 Vaccine (6 - 2023-24 season) 12/04/2022      Objective:     BP 127/82   Pulse 85   Ht 5\' 1"  (1.549 m)   Wt 182 lb 6.4 oz (82.7 kg)   SpO2 98%   BMI 34.46 kg/m    Physical Exam General: Alert, oriented HEENT: Patient has stitches in her upper gumline on the anterior lateral left side CV: Regular in rhythm Pulmonary: Clear bilaterally   No results found for any visits on 03/15/23.      Assessment & Plan:   Essential hypertension Assessment & Plan: Continue amlodipine-valsartan combo.  Blood pressure well-controlled.  No side effects.  Orders: -     Basic metabolic panel  Chronic bilateral low back pain without sciatica Assessment & Plan: Continue 7.5 mg meloxicam every other day.  Working well for her.   Inverted papilloma of maxillary sinus Assessment & Plan: Recent surgical removal with ENT.  Has follow-up with them on 12/17.  Pathology showed noncancerous papilloma.      Return in about 6 months (around 09/13/2023) for HTN.    Sandre Kitty, MD

## 2023-03-15 NOTE — Assessment & Plan Note (Signed)
Recent surgical removal with ENT.  Has follow-up with them on 12/17.  Pathology showed noncancerous papilloma.

## 2023-03-15 NOTE — Assessment & Plan Note (Signed)
Continue amlodipine-valsartan combo.  Blood pressure well-controlled.  No side effects.

## 2023-03-15 NOTE — Patient Instructions (Signed)
It was nice to see you today,  We addressed the following topics today: -Your blood pressure looks good.  No changes to your medication needed at this time. - I will recheck your kidney function test to make sure it has improved. - Follow-up in 6 months or sooner if needed.  Have a great day,  Frederic Jericho, MD

## 2023-03-16 LAB — BASIC METABOLIC PANEL
BUN/Creatinine Ratio: 12 (ref 12–28)
BUN: 12 mg/dL (ref 8–27)
CO2: 22 mmol/L (ref 20–29)
Calcium: 9.7 mg/dL (ref 8.7–10.3)
Chloride: 103 mmol/L (ref 96–106)
Creatinine, Ser: 1.02 mg/dL — ABNORMAL HIGH (ref 0.57–1.00)
Glucose: 103 mg/dL — ABNORMAL HIGH (ref 70–99)
Potassium: 4.4 mmol/L (ref 3.5–5.2)
Sodium: 144 mmol/L (ref 134–144)
eGFR: 58 mL/min/{1.73_m2} — ABNORMAL LOW (ref >59)

## 2023-04-24 ENCOUNTER — Telehealth: Payer: Self-pay

## 2023-04-24 MED ORDER — AMLODIPINE BESYLATE-VALSARTAN 5-160 MG PO TABS
1.0000 | ORAL_TABLET | Freq: Every day | ORAL | 3 refills | Status: AC
  Filled 2024-04-18: qty 90, 90d supply, fill #0

## 2023-04-24 NOTE — Telephone Encounter (Signed)
Copied from CRM [id number]. Topic: Clinical - Prescription Issue >> Apr 24, 2023  9:12 AM Herbert Seta B wrote: Reason for CRM:  amLODipine-valsartan (EXFORGE) 5-160 MG tablet Patient calling because refill sent in September for 3 bottles, patient says should have more remaining but has run out. Requesting samples or call pharmacy to clear up?  Called [facility] asking about pt Rx the pharmacist stated that the pt called in on 9/11 to  cancel the Rx because she was taking a combination. Reach out to the patient she stated that she did not not call the pharmacy to discontinued her medication so the pharmacist advised that a new Rx needs to be order.

## 2023-04-24 NOTE — Addendum Note (Signed)
Addended by: Saralyn Pilar on: 04/24/2023 10:49 AM   Modules accepted: Orders

## 2023-08-08 ENCOUNTER — Encounter: Payer: Self-pay | Admitting: Cardiovascular Disease

## 2023-08-08 ENCOUNTER — Ambulatory Visit: Attending: Cardiovascular Disease | Admitting: Cardiovascular Disease

## 2023-08-08 VITALS — BP 160/70 | HR 92 | Ht 61.75 in | Wt 188.4 lb

## 2023-08-08 DIAGNOSIS — Z952 Presence of prosthetic heart valve: Secondary | ICD-10-CM

## 2023-08-08 DIAGNOSIS — E7800 Pure hypercholesterolemia, unspecified: Secondary | ICD-10-CM | POA: Diagnosis not present

## 2023-08-08 DIAGNOSIS — I10 Essential (primary) hypertension: Secondary | ICD-10-CM | POA: Diagnosis not present

## 2023-08-08 NOTE — Progress Notes (Signed)
 Cardiology Office Note:    Date:  08/08/2023   ID:  Sierra Carpenter, Sierra Carpenter 1945/07/23, MRN [id number]  PCP:  Laneta Pintos, MD   [facility] Providers Cardiologist:  Luana Rumple, MD     Referring MD: Laneta Pintos, MD   Chief Complaint  Patient presents with   Cardiac Valve Problem    History of Present Illness:    Sierra Carpenter is a 78 y.o. 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    The patient specifically denies any chest pain at rest or with exertion, dyspnea at rest or with exertion, orthopnea, paroxysmal nocturnal dyspnea, syncope, palpitations, focal neurological deficits, intermittent claudication, lower extremity edema, unexplained weight gain, cough, hemoptysis or wheezing.  She has been taking meloxicam  at least every other day for her back pain.  She went to the wrong office today and is a little flustered and her blood pressure was high.  When we rechecked it it had improved but was still little elevated.  At home when she checks her blood pressure it usually runs in the 130s-140s/60s.  Her echocardiograms have shown normal gradients across the aortic valve but she tends to have a hyperdynamic left ventricle and a small outflow tract gradient.  She calls her prosthetic valve "Elsie".    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 Arlester Ladd and Dr. Sherene Dilling   Severe aortic stenosis    s/p TAVR 11/17/20     Past Surgical History:  Procedure Laterality Date   ABDOMINAL HYSTERECTOMY     fibroids; ovaries intact.   APPENDECTOMY     BILATERAL CARPAL TUNNEL RELEASE Bilateral    CARDIAC CATHETERIZATION     moderate stenosis in 1st diagonal & RCA   CESAREAN SECTION     CHOLECYSTECTOMY     EYE SURGERY Bilateral    cataracts   INNER EAR SURGERY Right    LITHOTRIPSY Right    NASAL SINUS SURGERY Left 03/03/2023   Procedure: FUNCTIONAL ENDOSCOPIC MAXILLARY ANTROSTOMY WITH TISSUE REMOVAL SINUS SURGERY;  Surgeon: Rush Coupe, MD;  Location: [facility] OR;  Service: ENT;  Laterality: Left;   RIGHT/LEFT HEART CATH AND CORONARY ANGIOGRAPHY N/A 10/21/2020   Procedure: RIGHT/LEFT HEART CATH AND CORONARY ANGIOGRAPHY;  Surgeon: Arnoldo Lapping, MD;  Location: [facility] INVASIVE CV LAB;  Service: Cardiovascular;  Laterality: N/A;   SINUSOTOMY Left 03/03/2023   Procedure: CALDWELL LUC APPROACH;  Surgeon: Rush Coupe, MD;  Location: [facility] OR;  Service: ENT;  Laterality: Left;   TEE WITHOUT CARDIOVERSION N/A 11/17/2020   Procedure: TRANSESOPHAGEAL ECHOCARDIOGRAM (TEE);  Surgeon: Arnoldo Lapping, MD;  Location: [facility] OR;  Service: Open Heart Surgery;  Laterality: N/A;   TRANSCATHETER AORTIC VALVE REPLACEMENT, TRANSFEMORAL N/A 11/17/2020   Procedure: TRANSCATHETER AORTIC VALVE REPLACEMENT, TRANSFEMORAL;  Surgeon: Arnoldo Lapping, MD;  Location: [facility] OR;  Service: Open Heart Surgery;  Laterality: N/A;   TRANSTHORACIC ECHOCARDIOGRAM  07/12/2010   EF=>55% with normal systolic function;  trace MR/TR/PR    Current Medications: Current Meds  Medication Sig   albuterol  (VENTOLIN  HFA) 108 (90 Base) MCG/ACT inhaler Inhale 1-2 puffs into the lungs every 6 (six) hours as needed for wheezing or shortness of breath.   amLODipine -valsartan  (EXFORGE ) 5-160 MG tablet Take 1 tablet by mouth daily.   aspirin  EC 81 MG tablet Take 81 mg by mouth at bedtime. Swallow whole.   atorvastatin  (LIPITOR ) 80 MG tablet Take 1 tablet by mouth once daily    Cholecalciferol (VITAMIN D3) 25 MCG (1000 UT) CAPS Take 1,000 Units by mouth 2 (two) times daily.   Coenzyme Q10 (COQ10) 100 MG CAPS Take 100 mg by mouth in the morning and at bedtime.   fish oil-omega-3 fatty acids 1000 MG capsule Take 1,000 mg by mouth 3 (three) times daily with meals.   meloxicam  (MOBIC ) 7.5 MG tablet Take 1 tablet (7.5 mg total) by mouth every other day.   oxybutynin  (DITROPAN -XL) 10 MG 24 hr tablet Take 1 tablet (10 mg total) by mouth 2 (two) times daily.     Allergies:   Hydrochlorothiazide    Family History: The patient's family history includes Alzheimer's disease in her mother; COPD in her brother; Heart disease in her brother, brother, brother, paternal grandfather, and son; Heart disease (age of onset: 59) in her father. There is no history of Breast cancer.  ROS:   Please see the history of present illness.     All other systems reviewed and are negative.  EKGs/Labs/Other Studies Reviewed:    The following studies were reviewed today: Echo 11/03/2022 1. There is a mid LV cavity gradient of at rest which increases to  with Valsalva and is related to hyperdynamic LVF.Aaron Aas Left ventricular  ejection fraction, by estimation, is 70 to 75%. The left ventricle has  hyperdynamic function. The left  ventricle has no regional wall motion abnormalities. There is moderate  concentric left ventricular hypertrophy. Left ventricular diastolic  parameters are consistent with Grade I diastolic dysfunction (impaired  relaxation). Elevated left ventricular  end-diastolic pressure.   2. Right ventricular systolic function is normal. The right ventricular  size is normal. Tricuspid regurgitation signal is inadequate for assessing  PA pressure.   3. The mitral valve is normal in structure. No evidence of mitral valve  regurgitation. No evidence of mitral stenosis.   4. The aortic valve has been repaired/replaced. Aortic valve  regurgitation is not visualized. No  aortic stenosis is present. There is a  23 mm Ultra, stented (TAVR) valve present in the aortic position. Aortic  valve mean gradient measures 11.0 mmHg.  Aortic valve Vmax measures 2.19 m/s. DVI 0.5. Findings are consistent with  normal structure and function of TAVR   5. Aortic dilatation noted. There is mild dilatation of the ascending  aorta, measuring 40 mm.   6. The inferior vena cava is normal in size with greater than 50%  respiratory variability, suggesting right atrial pressure of 3 mmHg.     EKG:    EKG Interpretation Date/Time:  Tuesday Aug 08 2023 09:22:51 EDT Ventricular Rate:  88 PR Interval:  172 QRS Duration:  96 QT Interval:  380 QTC Calculation: 459 R Axis:   -66  Text Interpretation: Normal sinus rhythm Left axis deviation Inferior infarct (cited on or before 18-Nov-2020) Anterolateral infarct (cited on or before 18-Nov-2020) When compared with ECG of 18-Nov-2020 01:22, Questionable change in initial forces of Lateral leads T wave inversion less evident in Lateral leads Confirmed by Cj Edgell,  Stesha Neyens [phone]) on 08/08/2023 9:35:26 AM         Recent Labs: 11/07/2022: ALT 14 02/24/2023: Hemoglobin 16.4; Platelets 284 03/15/2023: BUN 12; Creatinine, Ser 1.02; Potassium 4.4; Sodium 144  Recent Lipid Panel    Component Value Date/Time   CHOL 148 11/07/2022 0833   TRIG 154 (H) 11/07/2022 0833   HDL 43 11/07/2022 0833   CHOLHDL 3.4 11/07/2022 0833   CHOLHDL 3.2 11/18/2015 1111   VLDL 29 11/18/2015 1111   LDLCALC 78 11/07/2022 0833     Risk Assessment/Calculations:     HYPERTENSION CONTROL Vitals:   08/08/23 0926 08/08/23 0945  BP: (!) 176/70 (!) 160/70    The patient's blood pressure is elevated above target today.  In order to address the patient's elevated BP: Blood pressure will be monitored at home to determine if medication changes need to be made.; A current anti-hypertensive medication was adjusted today.           Physical Exam:    VS:  BP  (!) 160/70   Pulse 92   Ht 5' 1.75" (1.568 m)   Wt 85.5 kg   SpO2 96%   BMI 34.74 kg/m     Wt Readings from Last 3 Encounters:  08/08/23 85.5 kg  03/15/23 82.7 kg  03/03/23 84.5 kg     General: Alert, oriented x3, no distress, moderately obese Head: no evidence of trauma, PERRL, EOMI, no exophtalmos or lid lag, no myxedema, no xanthelasma; normal ears, nose and oropharynx Neck: normal jugular venous pulsations and no hepatojugular reflux; brisk carotid pulses without delay and no carotid bruits Chest: clear to auscultation, no signs of consolidation by percussion or palpation, normal fremitus, symmetrical and full respiratory excursions Cardiovascular: normal position and quality of the apical impulse, regular rhythm, normal first and second heart sounds, 2/6 early peaking "snoring" systolic ejection murmur in the aortic focus, no diastolic murmurs, rubs or gallops Abdomen: no tenderness or distention, no masses by palpation, no abnormal pulsatility or arterial bruits, normal bowel sounds, no hepatosplenomegaly Extremities: no clubbing, cyanosis or edema; 2+ radial, ulnar and brachial pulses bilaterally; 2+ right femoral, posterior tibial and dorsalis pedis pulses; 2+ left femoral, posterior tibial and dorsalis pedis pulses; no subclavian or femoral bruits Neurological: grossly nonfocal Psych: Normal mood and affect    ASSESSMENT:    1. S/P TAVR (transcatheter aortic valve replacement)   2. Aortic valve prosthesis present   3. Essential hypertension   4. Hypercholesterolemia     PLAN:    In order of problems listed above:  AS s/p TAVR: Asymptomatic.  Excellent clinical response and good hemodynamics on most recent echo.  Aware of the need for endocarditis prophylaxis, but she is edentulous. HTN: Blood pressure is a little high today, but was improving.  Asked her to avoid taking NSAIDs including the prescription meloxicam  (use it sparingly) and prefer use of  acetaminophen ). HLP: Satisfactory lipid parameters since she does not have known CAD or PAD           Medication Adjustments/Labs and Tests Ordered: Current medicines are reviewed at length with the patient today.  Concerns regarding medicines are outlined above.  Orders Placed This Encounter  Procedures   EKG 12-Lead   ECHOCARDIOGRAM COMPLETE   No orders of the defined types were placed in this encounter.   Patient Instructions  Medication Instructions:  Please try to use Meloxicam  sparingly- try to use tylenol  instead (if able) *If you need a refill on your cardiac medications before your  next appointment, please call your pharmacy*  Testing/Procedures: Your physician has requested that you have an echocardiogram April 2026 (before yearly appointment in May). Echocardiography is a painless test that uses sound waves to create images of your heart. It provides your doctor with information about the size and shape of your heart and how well your heart's chambers and valves are working. This procedure takes approximately one hour. There are no restrictions for this procedure. Please do NOT wear cologne, perfume, aftershave, or lotions (deodorant is allowed). Please arrive 15 minutes prior to your appointment time.  Please note: We ask at that you not bring children with you during ultrasound (echo/ vascular) testing. Due to room size and safety concerns, children are not allowed in the ultrasound rooms during exams. Our front office staff cannot provide observation of children in our lobby area while testing is being conducted. An adult accompanying a patient to their appointment will only be allowed in the ultrasound room at the discretion of the ultrasound technician under special circumstances. We apologize for any inconvenience.   Follow-Up: At [facility], you and your health needs are our priority.  As part of our continuing mission to provide you with exceptional heart  care, our providers are all part of one team.  This team includes your primary Cardiologist (physician) and Advanced Practice Providers or APPs (Physician Assistants and Nurse Practitioners) who all work together to provide you with the care you need, when you need it.  Your next appointment:   1 year(s)  Provider:   Luana Rumple, MD    We recommend signing up for the patient portal called "MyChart".  Sign up information is provided on this After Visit Summary.  MyChart is used to connect with patients for Virtual Visits (Telemedicine).  Patients are able to view lab/test results, encounter notes, upcoming appointments, etc.  Non-urgent messages can be sent to your provider as well.   To learn more about what you can do with MyChart, go to [email or web address].         Signed, Luana Rumple, MD  08/08/2023 11:17 AM    [facility]

## 2023-08-08 NOTE — Patient Instructions (Signed)
 Medication Instructions:  Please try to use Meloxicam  sparingly- try to use tylenol  instead (if able) *If you need a refill on your cardiac medications before your next appointment, please call your pharmacy*  Testing/Procedures: Your physician has requested that you have an echocardiogram April 2026 (before yearly appointment in May). Echocardiography is a painless test that uses sound waves to create images of your heart. It provides your doctor with information about the size and shape of your heart and how well your heart's chambers and valves are working. This procedure takes approximately one hour. There are no restrictions for this procedure. Please do NOT wear cologne, perfume, aftershave, or lotions (deodorant is allowed). Please arrive 15 minutes prior to your appointment time.  Please note: We ask at that you not bring children with you during ultrasound (echo/ vascular) testing. Due to room size and safety concerns, children are not allowed in the ultrasound rooms during exams. Our front office staff cannot provide observation of children in our lobby area while testing is being conducted. An adult accompanying a patient to their appointment will only be allowed in the ultrasound room at the discretion of the ultrasound technician under special circumstances. We apologize for any inconvenience.   Follow-Up: At [facility], you and your health needs are our priority.  As part of our continuing mission to provide you with exceptional heart care, our providers are all part of one team.  This team includes your primary Cardiologist (physician) and Advanced Practice Providers or APPs (Physician Assistants and Nurse Practitioners) who all work together to provide you with the care you need, when you need it.  Your next appointment:   1 year(s)  Provider:   Luana Rumple, MD    We recommend signing up for the patient portal called "MyChart".  Sign up information is provided on  this After Visit Summary.  MyChart is used to connect with patients for Virtual Visits (Telemedicine).  Patients are able to view lab/test results, encounter notes, upcoming appointments, etc.  Non-urgent messages can be sent to your provider as well.   To learn more about what you can do with MyChart, go to [email or web address].

## 2023-08-19 ENCOUNTER — Other Ambulatory Visit: Payer: Self-pay | Admitting: Family Medicine

## 2023-08-19 DIAGNOSIS — M545 Low back pain, unspecified: Secondary | ICD-10-CM

## 2023-09-13 ENCOUNTER — Ambulatory Visit: Payer: Medicare Other | Admitting: Family Medicine

## 2023-09-19 ENCOUNTER — Ambulatory Visit: Admitting: Family Medicine

## 2023-09-19 VITALS — BP 156/76 | HR 70 | Ht 61.75 in | Wt 191.4 lb

## 2023-09-19 DIAGNOSIS — G8929 Other chronic pain: Secondary | ICD-10-CM

## 2023-09-19 DIAGNOSIS — E7800 Pure hypercholesterolemia, unspecified: Secondary | ICD-10-CM

## 2023-09-19 DIAGNOSIS — R600 Localized edema: Secondary | ICD-10-CM | POA: Insufficient documentation

## 2023-09-19 DIAGNOSIS — I10 Essential (primary) hypertension: Secondary | ICD-10-CM

## 2023-09-19 DIAGNOSIS — N3941 Urge incontinence: Secondary | ICD-10-CM

## 2023-09-19 DIAGNOSIS — M545 Low back pain, unspecified: Secondary | ICD-10-CM

## 2023-09-19 DIAGNOSIS — R7303 Prediabetes: Secondary | ICD-10-CM

## 2023-09-19 MED ORDER — OXYBUTYNIN CHLORIDE ER 10 MG PO TB24: 10.0000 mg | ORAL_TABLET | Freq: Two times a day (BID) | ORAL | 1 refills | Status: DC

## 2023-09-19 NOTE — Assessment & Plan Note (Signed)
-   Continue atorvastatin  - Labs ordered to check lipid panel

## 2023-09-19 NOTE — Assessment & Plan Note (Signed)
-   Currently on amlodipine -valsartan  - home readings at goal - Continue current regimen - Monitor for worsening edema

## 2023-09-19 NOTE — Patient Instructions (Signed)
 It was nice to see you today,  We addressed the following topics today: -I am checking some labs to look at your possible causes of leg swelling but is most likely due to venous insufficiency - I would start by wearing some compression stockings that you can pick up in the pharmacy. - I will let you know the results of any labs when I get them.  Have a great day,  Etha Henle, MD

## 2023-09-19 NOTE — Assessment & Plan Note (Signed)
-   Cardiologist recommended reducing meloxicam  use - Attempting to substitute with Tylenol  and topical treatments - Using biofreeze as needed - Encouraged continued use of topical treatments as first line

## 2023-09-19 NOTE — Assessment & Plan Note (Signed)
-   New onset bilateral ankle/foot swelling since Sunday - Pitting edema present - No associated shortness of breath or fatigue - Possible medication side effect from amlodipine  vs venous insufficiency - Compression stockings recommended as first-line treatment - Will check bnp  to rule out cardiac etiology - Consider discontinuing amlodipine  if edema persists

## 2023-09-19 NOTE — Progress Notes (Signed)
   Established Patient Office Visit  Subjective   Patient ID: Sierra Carpenter, female    DOB: 1946/03/02  Age: 78 y.o. MRN: [id number]  Chief Complaint  Patient presents with   Medical Management of Chronic Issues    HPI  Subjective - Presents for follow-up - Reports dermatologist froze lesions off face on Friday - Reports prior mole removal from back, not cancerous - Reports small pillie spots on nose and face that dry up and peel off, previously frozen by dermatologist  - Reports recent cardiology visit with echocardiogram scheduled for next year - Reports continuing ENT care with follow-up scheduled for 11/28/2023 - Performs nasal rinse as needed, especially after outdoor activities  - Reports bilateral ankle/foot swelling since Sunday afternoon - Reports swelling improves with elevation for 30-35 minutes - Denies shortness of breath or increased fatigue  - Reports eye issues - crusty, red, watery eyes diagnosed as rosacea in left eye - Reports improvement with medicated eye drops  Medications: amlodipine -valsartan , atorvastatin , aspirin , CoQ10, oxybutynin  (needs refill - 2 days remaining), using Systane eye drops for eyes, biofreeze for pain as needed, trying to use Tylenol  instead of meloxicam  per cardiologist recommendation  PMH: hypertension, hyperlipidemia, urinary issues, back pain, rosacea affecting left eye, history of hypercalcemia      The 10-year ASCVD risk score (Arnett DK, et al., 2019) is: 28.9%  Health Maintenance Due  Topic Date Due   COVID-19 Vaccine (6 - 2024-25 season) 12/04/2022   Medicare Annual Wellness (AWV)  11/15/2023      Objective:     BP (!) 156/76   Pulse 70   Ht 5' 1.75 (1.568 m)   Wt 191 lb 6.4 oz (86.8 kg)   SpO2 96%   BMI 35.29 kg/m    Physical Exam Gen: alert, oriented Cv: rrr, systolic murmur Pulm: lctab Ext: b/l 1+ LE edema   No results found for any visits on 09/19/23.      Assessment & Plan:   Bilateral  lower extremity edema Assessment & Plan: - New onset bilateral ankle/foot swelling since Sunday - Pitting edema present - No associated shortness of breath or fatigue - Possible medication side effect from amlodipine  vs venous insufficiency - Compression stockings recommended as first-line treatment - Will check bnp  to rule out cardiac etiology - Consider discontinuing amlodipine  if edema persists  Orders: -     CBC -     Brain natriuretic peptide -     TSH  Urge incontinence -     oxyBUTYnin  Chloride ER; Take 1 tablet (10 mg total) by mouth 2 (two) times daily.  Dispense: 180 tablet; Refill: 1  Essential hypertension Assessment & Plan: - Currently on amlodipine -valsartan  - home readings at goal - Continue current regimen - Monitor for worsening edema  Orders: -     CBC  Hypercholesterolemia Assessment & Plan: - Continue atorvastatin  - Labs ordered to check lipid panel  Orders: -     Lipid panel -     Comprehensive metabolic panel with GFR  Prediabetes -     Hemoglobin A1c  Chronic bilateral low back pain without sciatica Assessment & Plan: - Cardiologist recommended reducing meloxicam  use - Attempting to substitute with Tylenol  and topical treatments - Using biofreeze as needed - Encouraged continued use of topical treatments as first line      Return in about 6 months (around 03/20/2024) for hld, HTN.    Laneta Pintos, MD

## 2023-09-20 LAB — COMPREHENSIVE METABOLIC PANEL WITH GFR
ALT: 30 IU/L (ref 0–32)
AST: 22 IU/L (ref 0–40)
Albumin: 4.5 g/dL (ref 3.8–4.8)
Alkaline Phosphatase: 146 IU/L — ABNORMAL HIGH (ref 44–121)
BUN/Creatinine Ratio: 17 (ref 12–28)
BUN: 17 mg/dL (ref 8–27)
Bilirubin Total: 0.4 mg/dL (ref 0.0–1.2)
CO2: 20 mmol/L (ref 20–29)
Calcium: 9.8 mg/dL (ref 8.7–10.3)
Chloride: 104 mmol/L (ref 96–106)
Creatinine, Ser: 1.03 mg/dL — ABNORMAL HIGH (ref 0.57–1.00)
Globulin, Total: 1.8 g/dL (ref 1.5–4.5)
Glucose: 106 mg/dL — ABNORMAL HIGH (ref 70–99)
Potassium: 4.2 mmol/L (ref 3.5–5.2)
Sodium: 142 mmol/L (ref 134–144)
Total Protein: 6.3 g/dL (ref 6.0–8.5)
eGFR: 57 mL/min/{1.73_m2} — ABNORMAL LOW (ref >59)

## 2023-09-20 LAB — LIPID PANEL
Chol/HDL Ratio: 3.5 ratio (ref 0.0–4.4)
Cholesterol, Total: 138 mg/dL (ref 100–199)
HDL: 39 mg/dL — ABNORMAL LOW (ref >39)
LDL Chol Calc (NIH): 71 mg/dL (ref 0–99)
Triglycerides: 163 mg/dL — ABNORMAL HIGH (ref 0–149)
VLDL Cholesterol Cal: 28 mg/dL (ref 5–40)

## 2023-09-20 LAB — CBC
Hematocrit: 48.4 % — ABNORMAL HIGH (ref 34.0–46.6)
Hemoglobin: 15.8 g/dL (ref 11.1–15.9)
MCH: 31.4 pg (ref 26.6–33.0)
MCHC: 32.6 g/dL (ref 31.5–35.7)
MCV: 96 fL (ref 79–97)
Platelets: 273 10*3/uL (ref 150–450)
RBC: 5.03 x10E6/uL (ref 3.77–5.28)
RDW: 13.3 % (ref 11.7–15.4)
WBC: 10.2 10*3/uL (ref 3.4–10.8)

## 2023-09-20 LAB — HEMOGLOBIN A1C
Est. average glucose Bld gHb Est-mCnc: 128 mg/dL
Hgb A1c MFr Bld: 6.1 % — ABNORMAL HIGH (ref 4.8–5.6)

## 2023-09-20 LAB — BRAIN NATRIURETIC PEPTIDE: BNP: 26.9 pg/mL (ref 0.0–100.0)

## 2023-09-20 LAB — TSH: TSH: 3.3 u[IU]/mL (ref 0.450–4.500)

## 2023-09-21 ENCOUNTER — Ambulatory Visit: Payer: Self-pay | Admitting: Family Medicine

## 2023-09-23 ENCOUNTER — Other Ambulatory Visit: Payer: Self-pay | Admitting: Cardiovascular Disease

## 2023-11-06 ENCOUNTER — Other Ambulatory Visit: Payer: Self-pay | Admitting: Family Medicine

## 2023-11-06 DIAGNOSIS — Z1231 Encounter for screening mammogram for malignant neoplasm of breast: Secondary | ICD-10-CM

## 2023-11-23 ENCOUNTER — Ambulatory Visit (INDEPENDENT_AMBULATORY_CARE_PROVIDER_SITE_OTHER): Payer: Medicare Other

## 2023-11-23 DIAGNOSIS — Z0000 Encounter for general adult medical examination without abnormal findings: Secondary | ICD-10-CM

## 2023-11-23 DIAGNOSIS — Z1211 Encounter for screening for malignant neoplasm of colon: Secondary | ICD-10-CM | POA: Diagnosis not present

## 2023-11-23 NOTE — Progress Notes (Signed)
 Subjective:   Sierra Carpenter is a 78 y.o. who presents for a Medicare Wellness preventive visit.  As a reminder, Annual Wellness Visits don't include a physical exam, and some assessments may be limited, especially if this visit is performed virtually. We may recommend an in-person follow-up visit with your provider if needed.  Visit Complete: Virtual I connected with  Sierra Carpenter on 11/23/23 by a audio enabled telemedicine application and verified that I am speaking with the correct person using two identifiers.  Patient Location: Home  Provider Location: Home Office  I discussed the limitations of evaluation and management by telemedicine. The patient expressed understanding and agreed to proceed.  Vital Signs: Because this visit was a virtual/telehealth visit, some criteria may be missing or patient reported. Any vitals not documented were not able to be obtained and vitals that have been documented are patient reported.  VideoError- Librarian, academic were attempted between this provider and patient, however failed, due to patient having technical difficulties OR patient did not have access to video capability.  We continued and completed visit with audio only.   Persons Participating in Visit: Patient.  AWV Questionnaire: No: Patient Medicare AWV questionnaire was not completed prior to this visit.  Cardiac Risk Factors include: advanced age (>34men, >30 women);hypertension     Objective:    Today's Vitals   There is no height or weight on file to calculate BMI.     11/23/2023    8:18 AM 02/24/2023    8:59 AM 11/15/2022    8:31 AM 11/17/2020    6:07 AM 11/13/2020    2:25 PM 10/29/2020   12:58 PM 10/21/2020    9:37 AM  Advanced Directives  Does Patient Have a Medical Advance Directive? Yes Yes Yes Yes Yes Yes Yes  Type of Estate agent of [place];Living will Healthcare Power of [place];Living will Healthcare Power of  [place];Living will Living will;Healthcare Power of Attorney Living will;Healthcare Power of Attorney Living will;Healthcare Power of Attorney Living will;Healthcare Power of Attorney  Does patient want to make changes to medical advance directive?  No - Patient declined No - Patient declined   No - Patient declined No - Patient declined  Copy of Healthcare Power of Attorney in Chart? Yes - validated most recent copy scanned in chart (See row information) Yes - validated most recent copy scanned in chart (See row information) Yes - validated most recent copy scanned in chart (See row information) Yes - validated most recent copy scanned in chart (See row information) No - copy requested No - copy requested No - copy requested    Current Medications (verified) Outpatient Encounter Medications as of 11/23/2023  Medication Sig   albuterol  (VENTOLIN  HFA) 108 (90 Base) MCG/ACT inhaler Inhale 1-2 puffs into the lungs every 6 (six) hours as needed for wheezing or shortness of breath.   amLODipine -valsartan  (EXFORGE ) 5-160 MG tablet Take 1 tablet by mouth daily.   aspirin  EC 81 MG tablet Take 81 mg by mouth at bedtime. Swallow whole.   atorvastatin  (LIPITOR ) 80 MG tablet Take 1 tablet by mouth once daily   Cholecalciferol (VITAMIN D3) 25 MCG (1000 UT) CAPS Take 1,000 Units by mouth 2 (two) times daily.   Coenzyme Q10 (COQ10) 100 MG CAPS Take 100 mg by mouth in the morning and at bedtime.   fish oil-omega-3 fatty acids 1000 MG capsule Take 1,000 mg by mouth 3 (three) times daily with meals.   meloxicam  (MOBIC ) 7.5  MG tablet TAKE 1 TABLET BY MOUTH EVERY OTHER DAY   Multiple Vitamins-Minerals (PRESERVISION AREDS PO) Take 1 tablet by mouth 2 (two) times daily.   oxybutynin  (DITROPAN -XL) 10 MG 24 hr tablet Take 1 tablet (10 mg total) by mouth 2 (two) times daily.   Propylene Glycol (SYSTANE COMPLETE PF OP) Apply 1 drop to eye in the morning, at noon, in the evening, and at bedtime.   No facility-administered  encounter medications on file as of 11/23/2023.    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 Wonda and Dr. Lucas   Severe aortic stenosis    s/p TAVR 11/17/20   Past Surgical History:  Procedure Laterality Date   ABDOMINAL HYSTERECTOMY     fibroids; ovaries intact.   APPENDECTOMY     BILATERAL CARPAL TUNNEL RELEASE Bilateral    CARDIAC CATHETERIZATION     moderate stenosis in 1st diagonal & RCA   CESAREAN SECTION     CHOLECYSTECTOMY     EYE SURGERY Bilateral    cataracts   INNER EAR SURGERY Right    LITHOTRIPSY Right    NASAL SINUS SURGERY Left 03/03/2023   Procedure: FUNCTIONAL ENDOSCOPIC MAXILLARY ANTROSTOMY WITH TISSUE REMOVAL SINUS SURGERY;  Surgeon: Luciano Standing, MD;  Location: [facility] OR;  Service: ENT;  Laterality: Left;   RIGHT/LEFT HEART CATH AND CORONARY ANGIOGRAPHY N/A 10/21/2020   Procedure: RIGHT/LEFT HEART CATH AND CORONARY ANGIOGRAPHY;  Surgeon: Wonda Sharper, MD;  Location: [facility] INVASIVE CV LAB;  Service: Cardiovascular;  Laterality: N/A;   SINUSOTOMY Left 03/03/2023   Procedure: CALDWELL LUC APPROACH;  Surgeon: Luciano Standing, MD;  Location: [facility] OR;  Service: ENT;  Laterality: Left;   TEE WITHOUT CARDIOVERSION N/A 11/17/2020   Procedure: TRANSESOPHAGEAL ECHOCARDIOGRAM (TEE);  Surgeon: Wonda Sharper, MD;  Location: [facility] OR;  Service: Open Heart Surgery;  Laterality: N/A;   TRANSCATHETER AORTIC VALVE REPLACEMENT, TRANSFEMORAL N/A 11/17/2020   Procedure: TRANSCATHETER AORTIC VALVE REPLACEMENT,  TRANSFEMORAL;  Surgeon: Wonda Sharper, MD;  Location: [facility] OR;  Service: Open Heart Surgery;  Laterality: N/A;   TRANSTHORACIC ECHOCARDIOGRAM  07/12/2010   EF=>55% with normal systolic function; trace MR/TR/PR   Family History  Problem Relation Age of Onset   Heart disease Father 3       AMI   Heart disease Brother    Heart disease Son    Heart disease Paternal Grandfather    Alzheimer's disease Mother    Heart disease Brother        CHF/CAD   COPD Brother    Heart disease Brother    Breast cancer Neg Hx    Social History   Socioeconomic History   Marital status: Widowed    Spouse name: Not on file   Number of children: 1   Years of education: 62   Highest education level: 12th grade  Occupational History   Occupation: retired    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      Children: 1 daughter; 1 son died of CHF age 31 pacemaker.  4 grandchildren.      Lives: alone      Employment: retired at age 33 from [employer].      Tobacco:  never       Alcohol:  never      Exercise: yes in 2019; walking 1-2 miles daily.  DDD lumbar limiting walking.      ADLs: independent with ADLs; drives.        Advanced Directives: none; has paper.  FULL CODE; no prolonged measures.  HCPOA: daughter/Misty Wenn-Maness.   Social Drivers of Corporate investment banker Strain: Low Risk  (11/23/2023)   Overall Financial Resource Strain (CARDIA)    Difficulty of Paying Living Expenses: Not hard at all  Food Insecurity: No Food Insecurity (11/23/2023)   Hunger Vital Sign    Worried About  Running Out of Food in the Last Year: Never true    Ran Out of Food in the Last Year: Never true  Transportation Needs: No Transportation Needs (11/23/2023)   PRAPARE - Administrator, Civil Service (Medical): No    Lack of Transportation (Non-Medical): No  Physical Activity: Insufficiently Active (11/23/2023)   Exercise Vital Sign    Days of Exercise per Week: 3 days    Minutes of Exercise per Session: 30 min  Stress: No Stress Concern Present (11/23/2023)   [employer] of Occupational Health - Occupational Stress Questionnaire    Feeling of Stress: Not at all  Social Connections: Moderately Integrated (11/23/2023)   Social Connection and Isolation Panel    Frequency of Communication with Friends and Family: More than three times a week    Frequency of Social Gatherings with Friends and Family: Three times a week    Attends Religious Services: More than 4 times per year    Active Member of Clubs or Organizations: Yes    Attends Banker Meetings: More than 4 times per year    Marital Status: Widowed    Tobacco Counseling Counseling given: Not Answered    Clinical Intake:  Pre-visit preparation completed: Yes  Pain : No/denies pain     Nutritional Risks: None Diabetes: No  Lab Results  Component Value Date   HGBA1C 6.1 (H) 09/19/2023   HGBA1C 6.1 (H) 11/07/2022   HGBA1C 5.5 09/23/2020     How often do you need to have someone help you when you read instructions, pamphlets, or other written materials from your doctor or pharmacy?: 1 - Never  Interpreter Needed?: No  Information entered by :: NAllen LPN   Activities of Daily Living     11/23/2023    8:07 AM 02/24/2023    9:01 AM  In your present state of health, do you have any difficulty performing the following activities:  Hearing? 1   Comment has hearing aids   Vision? 0   Difficulty concentrating or making decisions? 0   Walking or climbing stairs? 0   Dressing or bathing? 0    Doing errands, shopping? 0 0  Preparing Food and eating ? N   Using the Toilet? N   In the past six months, have you accidently leaked urine? Y   Comment incontinence   Do you have problems with loss of bowel control? N   Managing your Medications? N   Managing your Finances? N   Housekeeping or managing your Housekeeping? N     Patient Care Team: Chandra Toribio POUR, MD as PCP - General (Family Medicine) Croitoru, Jerel, MD as PCP - Cardiology (Cardiology) Francyne Jerel, MD as Consulting Physician (Cardiology) Octavia, Charlie Hamilton, MD as Consulting Physician (Ophthalmology)  I have updated your Care Teams any recent Medical Services you may have received from other providers in the past year.     Assessment:   This is a routine wellness examination for Sierra Carpenter.  Hearing/Vision screen Hearing Screening - Comments:: Has hearing aids that are maintained Vision Screening - Comments:: Regular eye exams, [facility]   Goals Addressed             This Visit's Progress    Patient Stated       11/23/2023, wants to lose weight       Depression Screen     11/23/2023    8:21 AM 09/19/2023    9:05 AM 03/15/2023   10:47 AM 05/17/2022   10:15 AM 09/29/2021    9:56 AM 03/30/2021    9:56 AM 12/15/2020   10:01 AM  PHQ 2/9 Scores  PHQ - 2 Score 0 0 0 2 1 0 0  PHQ- 9 Score 0 0 0 2 2 1  0    Fall Risk     11/23/2023    8:19 AM 11/15/2022    8:30 AM 11/08/2022    3:26 AM 05/17/2022   10:15 AM 03/30/2021    9:56 AM  Fall Risk   Falls in the past year? 0 0 0 0 1  Number falls in past yr: 0 0 0 0 1  Injury with Fall? 0 0 0 0 0  Risk for fall due to : Medication side effect No Fall Risks     Follow up Falls evaluation completed;Falls prevention discussed Falls prevention discussed  Falls evaluation completed Falls evaluation completed      Data saved with a previous flowsheet row definition    MEDICARE RISK AT HOME:  Medicare Risk at Home Any stairs in or around the home?:  Yes If so, are there any without handrails?: No Home free of loose throw rugs in walkways, pet beds, electrical cords, etc?: Yes Adequate lighting in your home to reduce risk of falls?: Yes Life alert?: No Use of a cane, walker or w/c?: No Grab bars in the bathroom?: No Shower chair or bench in shower?: No Elevated toilet seat or a handicapped toilet?: No  TIMED UP AND GO:  Was the test performed?  No  Cognitive Function: 6CIT completed        11/23/2023    8:21 AM 11/15/2022    8:31 AM 09/29/2021    8:26 AM 09/30/2020    9:42 AM 09/09/2019    8:35 AM  6CIT Screen  What Year? 0 points 0 points 0 points 0 points 0 points  What month? 0 points 0 points 0 points 0 points 0 points  What time? 0 points 0 points 0 points 0 points 0 points  Count back from 20 0 points 0 points 0 points 0 points 0 points  Months in reverse 0 points 0 points 0  points 0 points 0 points  Repeat phrase 0 points 0 points 0 points 0 points 0 points  Total Score 0 points 0 points 0 points 0 points 0 points    Immunizations Immunization History  Administered Date(s) Administered   Fluad Quad(high Dose 65+) 11/14/2023   Hep A / Hep B 11/15/2022   Influenza Split 12/08/2012   Influenza, High Dose Seasonal PF 11/23/2018, 12/03/2020   Influenza, Quadrivalent, Recombinant, Inj, Pf 11/22/2017, 12/03/2020   Influenza,inj,Quad PF,6+ Mos 12/18/2014, 11/29/2016   Influenza-Unspecified 12/10/2013, 11/30/2015, 06/01/2016, 11/22/2017, 11/23/2018, 11/22/2019, 11/11/2021, 11/15/2022   PFIZER(Purple Top)SARS-COV-2 Vaccination 05/30/2019, 06/25/2019, 06/25/2019, 01/28/2020, 06/26/2020   PNEUMOCOCCAL CONJUGATE-20 12/03/2020   Pneumococcal Conjugate-13 12/10/2013   Pneumococcal Polysaccharide-23 12/18/2014   Respiratory Syncytial Virus Vaccine,Recomb Aduvanted(Arexvy) 11/15/2022   Tdap 08/07/2010, 06/26/2020   Zoster Recombinant(Shingrix) 06/01/2016, 08/27/2016    Screening Tests Health Maintenance  Topic Date Due    COVID-19 Vaccine (6 - 2024-25 season) 12/04/2022   Fecal DNA (Cologuard)  12/24/2023   Medicare Annual Wellness (AWV)  11/22/2024   DTaP/Tdap/Td (3 - Td or Tdap) 06/27/2030   Pneumococcal Vaccine: 50+ Years  Completed   INFLUENZA VACCINE  Completed   DEXA SCAN  Completed   Hepatitis C Screening  Completed   Zoster Vaccines- Shingrix  Completed   HPV VACCINES  Aged Out   Meningococcal B Vaccine  Aged Out   Hepatitis B Vaccines 19-59 Average Risk  Discontinued   Colonoscopy  Discontinued    Health Maintenance  Health Maintenance Due  Topic Date Due   COVID-19 Vaccine (6 - 2024-25 season) 12/04/2022   Health Maintenance Items Addressed: Up to date  Additional Screening:  Vision Screening: Recommended annual ophthalmology exams for early detection of glaucoma and other disorders of the eye. Would you like a referral to an eye doctor? No    Dental Screening: Recommended annual dental exams for proper oral hygiene  Community Resource Referral / Chronic Care Management: CRR required this visit?  No   CCM required this visit?  No   Plan:    I have personally reviewed and noted the following in the patient's chart:   Medical and social history Use of alcohol, tobacco or illicit drugs  Current medications and supplements including opioid prescriptions. Patient is not currently taking opioid prescriptions. Functional ability and status Nutritional status Physical activity Advanced directives List of other physicians Hospitalizations, surgeries, and ER visits in previous 12 months Vitals Screenings to include cognitive, depression, and falls Referrals and appointments  In addition, I have reviewed and discussed with patient certain preventive protocols, quality metrics, and best practice recommendations. A written personalized care plan for preventive services as well as general preventive health recommendations were provided to patient.   Ardella FORBES Dawn,  LPN   [id number]   After Visit Summary: (MyChart) Due to this being a telephonic visit, the after visit summary with patients personalized plan was offered to patient via MyChart   Notes: Nothing significant to report at this time.

## 2023-11-23 NOTE — Patient Instructions (Signed)
 Sierra Carpenter , Thank you for taking time out of your busy schedule to complete your Annual Wellness Visit with me. I enjoyed our conversation and look forward to speaking with you again next year. I, as well as your care team,  appreciate your ongoing commitment to your health goals. Please review the following plan we discussed and let me know if I can assist you in the future. Your Game plan/ To Do List    Referrals: If you haven't heard from the office you've been referred to, please reach out to them at the phone provided.   Follow up Visits: We will see or speak with you next year for your Next Medicare AWV with our clinical staff Have you seen your provider in the last 6 months (3 months if uncontrolled diabetes)? Yes  Clinician Recommendations:  Aim for 30 minutes of exercise or brisk walking, 6-8 glasses of water, and 5 servings of fruits and vegetables each day.       This is a list of the screenings recommended for you:  Health Maintenance  Topic Date Due   COVID-19 Vaccine (6 - 2024-25 season) 12/04/2022   Cologuard (Stool DNA test)  12/24/2023   Medicare Annual Wellness Visit  11/22/2024   DTaP/Tdap/Td vaccine (3 - Td or Tdap) 06/27/2030   Pneumococcal Vaccine for age over 19  Completed   Flu Shot  Completed   DEXA scan (bone density measurement)  Completed   Hepatitis C Screening  Completed   Zoster (Shingles) Vaccine  Completed   HPV Vaccine  Aged Out   Meningitis B Vaccine  Aged Out   Hepatitis B Vaccine  Discontinued   Colon Cancer Screening  Discontinued    Advanced directives: (In Chart) A copy of your advanced directives are scanned into your chart should your provider ever need it. Advance Care Planning is important because it:  [x]  Makes sure you receive the medical care that is consistent with your values, goals, and preferences  [x]  It provides guidance to your family and loved ones and reduces their decisional burden about whether or not they are making the  right decisions based on your wishes.  Follow the link provided in your after visit summary or read over the paperwork we have mailed to you to help you started getting your Advance Directives in place. If you need assistance in completing these, please reach out to us  so that we can help you!  See attachments for Preventive Care and Fall Prevention Tips.

## 2023-12-05 LAB — COLOGUARD: COLOGUARD: NEGATIVE

## 2023-12-06 ENCOUNTER — Ambulatory Visit: Payer: Self-pay

## 2023-12-07 ENCOUNTER — Ambulatory Visit
Admission: RE | Admit: 2023-12-07 | Discharge: 2023-12-07 | Disposition: A | Discharge status: Home / Self Care | Source: Ambulatory Visit | Attending: Family Medicine

## 2023-12-07 DIAGNOSIS — Z1231 Encounter for screening mammogram for malignant neoplasm of breast: Secondary | ICD-10-CM

## 2023-12-15 ENCOUNTER — Other Ambulatory Visit: Payer: Self-pay | Admitting: Family Medicine

## 2023-12-15 ENCOUNTER — Telehealth: Payer: Self-pay

## 2023-12-15 MED ORDER — COVID-19 MRNA VAC-TRIS(PFIZER) 30 MCG/0.3ML IM SUSY: 0.3000 mL | PREFILLED_SYRINGE | Freq: Once | INTRAMUSCULAR | 0 refills | Status: AC

## 2023-12-15 NOTE — Telephone Encounter (Signed)
 Pt dropped in asking that a Rx for the Pfizer Covid Vaccine can be sent to [facility] on [address] [place].

## 2023-12-15 NOTE — Telephone Encounter (Signed)
Its been sent in 

## 2024-02-11 ENCOUNTER — Other Ambulatory Visit: Payer: Self-pay | Admitting: Family Medicine

## 2024-02-11 DIAGNOSIS — G8929 Other chronic pain: Secondary | ICD-10-CM

## 2024-03-11 ENCOUNTER — Other Ambulatory Visit: Payer: Self-pay | Admitting: Family Medicine

## 2024-03-11 DIAGNOSIS — N3941 Urge incontinence: Secondary | ICD-10-CM

## 2024-04-18 ENCOUNTER — Other Ambulatory Visit (HOSPITAL_COMMUNITY): Payer: Self-pay

## 2024-04-18 ENCOUNTER — Other Ambulatory Visit: Payer: Self-pay

## 2024-04-18 MED FILL — Atorvastatin Calcium Tab 80 MG (Base Equivalent): ORAL | 90 days supply | Qty: 90 | Fill #0 | Status: AC

## 2024-04-18 MED FILL — Oxybutynin Chloride Tab ER 24HR 10 MG: ORAL | 90 days supply | Qty: 180 | Fill #0 | Status: AC

## 2024-04-19 ENCOUNTER — Other Ambulatory Visit: Payer: Self-pay

## 2024-04-19 ENCOUNTER — Other Ambulatory Visit (HOSPITAL_COMMUNITY): Payer: Self-pay

## 2024-04-22 ENCOUNTER — Telehealth: Payer: Self-pay

## 2024-04-22 ENCOUNTER — Other Ambulatory Visit: Payer: Self-pay

## 2024-04-22 ENCOUNTER — Other Ambulatory Visit (HOSPITAL_COMMUNITY): Payer: Self-pay

## 2024-04-22 DIAGNOSIS — Z9889 Other specified postprocedural states: Secondary | ICD-10-CM

## 2024-04-22 DIAGNOSIS — Z86018 Personal history of other benign neoplasm: Secondary | ICD-10-CM

## 2024-04-22 DIAGNOSIS — M545 Low back pain, unspecified: Secondary | ICD-10-CM

## 2024-04-22 MED ORDER — MELOXICAM 7.5 MG PO TABS
7.5000 mg | ORAL_TABLET | ORAL | 0 refills | Status: AC
  Filled 2024-04-22: qty 45, 90d supply, fill #0

## 2024-04-22 NOTE — Telephone Encounter (Signed)
 Pt is requesting a new Rx  for meloxicam  (MOBIC ) 7.5 MG tablet  to be sent to [facility]

## 2024-04-22 NOTE — Telephone Encounter (Signed)
 Pt neededs a new referral [facility]

## 2024-04-22 NOTE — Telephone Encounter (Signed)
 It looks like patient just saw the ENT specialist at [facility] in [place] last month. No new referral should be needed. If they are requesting a new referral, can she tell us  why?  All she should need to do is call their office to set up the appt. She is an established patient there.

## 2024-04-22 NOTE — Telephone Encounter (Signed)
 Done

## 2024-04-23 ENCOUNTER — Other Ambulatory Visit (HOSPITAL_BASED_OUTPATIENT_CLINIC_OR_DEPARTMENT_OTHER): Payer: Self-pay

## 2024-04-23 ENCOUNTER — Other Ambulatory Visit: Payer: Self-pay

## 2024-04-23 ENCOUNTER — Other Ambulatory Visit (HOSPITAL_COMMUNITY): Payer: Self-pay

## 2024-04-23 ENCOUNTER — Telehealth: Payer: Self-pay

## 2024-04-23 NOTE — Telephone Encounter (Signed)
 New referrals are being requesting from insurance is what I have been hearing from different pts even if they are est patients

## 2024-04-23 NOTE — Addendum Note (Signed)
 Addended byBETHA GAYLE NUMBERS on: 04/23/2024 11:49 AM   Modules accepted: Orders

## 2024-04-23 NOTE — Telephone Encounter (Signed)
 Called to verify with [facility] and they do not need another referral. I let the pt know that as well.

## 2024-04-23 NOTE — Telephone Encounter (Signed)
 Called pt Sierra Carpenter to contact the office if pt calls please advise

## 2024-04-23 NOTE — Telephone Encounter (Signed)
 New referral placed.

## 2024-04-23 NOTE — Telephone Encounter (Signed)
 Copied from CRM [id number]. Topic: General - Other >> Apr 23, 2024 10:48 AM Delon DASEN wrote: Reason for CRM: patient returned call advised medication was called in and gave message about not needing referral

## 2024-04-29 ENCOUNTER — Other Ambulatory Visit (HOSPITAL_COMMUNITY): Payer: Self-pay

## 2024-07-08 ENCOUNTER — Other Ambulatory Visit (HOSPITAL_COMMUNITY)

## 2024-09-13 ENCOUNTER — Ambulatory Visit: Admitting: Cardiovascular Disease

## 2025-01-16 ENCOUNTER — Ambulatory Visit
